# Patient Record
Sex: Female | Born: 1985 | Race: White | Hispanic: No | Marital: Married | Smoking: Never smoker
Health system: Southern US, Community
[De-identification: ages and names within clinical notes are randomized; demographics above are authoritative.]

## PROBLEM LIST (undated history)

## (undated) ENCOUNTER — Inpatient Hospital Stay: Payer: Self-pay

## (undated) DIAGNOSIS — Z8619 Personal history of other infectious and parasitic diseases: Secondary | ICD-10-CM

## (undated) DIAGNOSIS — F419 Anxiety disorder, unspecified: Secondary | ICD-10-CM

## (undated) DIAGNOSIS — Z8661 Personal history of infections of the central nervous system: Secondary | ICD-10-CM

## (undated) HISTORY — DX: Personal history of infections of the central nervous system: Z86.61

## (undated) HISTORY — DX: Anxiety disorder, unspecified: F41.9

## (undated) HISTORY — DX: Personal history of other infectious and parasitic diseases: Z86.19

## (undated) HISTORY — PX: WISDOM TOOTH EXTRACTION: SHX21

---
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
## 2008-05-12 DIAGNOSIS — Z8661 Personal history of infections of the central nervous system: Secondary | ICD-10-CM | POA: Insufficient documentation

## 2010-06-07 ENCOUNTER — Ambulatory Visit: Payer: Self-pay | Admitting: Internal Medicine

## 2010-06-16 ENCOUNTER — Ambulatory Visit: Payer: Self-pay | Admitting: Family Medicine

## 2010-09-06 ENCOUNTER — Ambulatory Visit: Payer: Self-pay | Admitting: Internal Medicine

## 2010-10-06 ENCOUNTER — Ambulatory Visit: Payer: Self-pay | Admitting: Family Medicine

## 2014-03-09 ENCOUNTER — Ambulatory Visit (INDEPENDENT_AMBULATORY_CARE_PROVIDER_SITE_OTHER): Payer: BC Managed Care – PPO

## 2014-03-09 ENCOUNTER — Ambulatory Visit (INDEPENDENT_AMBULATORY_CARE_PROVIDER_SITE_OTHER): Payer: BC Managed Care – PPO | Admitting: Podiatry

## 2014-03-09 DIAGNOSIS — M722 Plantar fascial fibromatosis: Secondary | ICD-10-CM

## 2014-03-09 DIAGNOSIS — M79672 Pain in left foot: Secondary | ICD-10-CM

## 2014-03-09 DIAGNOSIS — M79671 Pain in right foot: Secondary | ICD-10-CM

## 2014-03-09 NOTE — Patient Instructions (Signed)
Plantar Fasciitis (Heel Spur Syndrome) with Rehab The plantar fascia is a fibrous, ligament-like, soft-tissue structure that spans the bottom of the foot. Plantar fasciitis is a condition that causes pain in the foot due to inflammation of the tissue. SYMPTOMS   Pain and tenderness on the underneath side of the foot.  Pain that worsens with standing or walking. CAUSES  Plantar fasciitis is caused by irritation and injury to the plantar fascia on the underneath side of the foot. Common mechanisms of injury include:  Direct trauma to bottom of the foot.  Damage to a small nerve that runs under the foot where the main fascia attaches to the heel bone.  Stress placed on the plantar fascia due to bone spurs. RISK INCREASES WITH:   Activities that place stress on the plantar fascia (running, jumping, pivoting, or cutting).  Poor strength and flexibility.  Improperly fitted shoes.  Tight calf muscles.  Flat feet.  Failure to warm-up properly before activity.  Obesity. PREVENTION  Warm up and stretch properly before activity.  Allow for adequate recovery between workouts.  Maintain physical fitness:  Strength, flexibility, and endurance.  Cardiovascular fitness.  Maintain a health body weight.  Avoid stress on the plantar fascia.  Wear properly fitted shoes, including arch supports for individuals who have flat feet. PROGNOSIS  If treated properly, then the symptoms of plantar fasciitis usually resolve without surgery. However, occasionally surgery is necessary. RELATED COMPLICATIONS   Recurrent symptoms that may result in a chronic condition.  Problems of the lower back that are caused by compensating for the injury, such as limping.  Pain or weakness of the foot during push-off following surgery.  Chronic inflammation, scarring, and partial or complete fascia tear, occurring more often from repeated injections. TREATMENT  Treatment initially involves the use of  ice and medication to help reduce pain and inflammation. The use of strengthening and stretching exercises may help reduce pain with activity, especially stretches of the Achilles tendon. These exercises may be performed at home or with a therapist. Your caregiver may recommend that you use heel cups of arch supports to help reduce stress on the plantar fascia. Occasionally, corticosteroid injections are given to reduce inflammation. If symptoms persist for greater than 6 months despite non-surgical (conservative), then surgery may be recommended.  MEDICATION   If pain medication is necessary, then nonsteroidal anti-inflammatory medications, such as aspirin and ibuprofen, or other minor pain relievers, such as acetaminophen, are often recommended.  Do not take pain medication within 7 days before surgery.  Prescription pain relievers may be given if deemed necessary by your caregiver. Use only as directed and only as much as you need.  Corticosteroid injections may be given by your caregiver. These injections should be reserved for the most serious cases, because they may only be given a certain number of times. HEAT AND COLD  Cold treatment (icing) relieves pain and reduces inflammation. Cold treatment should be applied for 10 to 15 minutes every 2 to 3 hours for inflammation and pain and immediately after any activity that aggravates your symptoms. Use ice packs or massage the area with a piece of ice (ice massage).  Heat treatment may be used prior to performing the stretching and strengthening activities prescribed by your caregiver, physical therapist, or athletic trainer. Use a heat pack or soak the injury in warm water. SEEK IMMEDIATE MEDICAL CARE IF:  Treatment seems to offer no benefit, or the condition worsens.  Any medications produce adverse side effects. EXERCISES RANGE   OF MOTION (ROM) AND STRETCHING EXERCISES - Plantar Fasciitis (Heel Spur Syndrome) These exercises may help you  when beginning to rehabilitate your injury. Your symptoms may resolve with or without further involvement from your physician, physical therapist or athletic trainer. While completing these exercises, remember:   Restoring tissue flexibility helps normal motion to return to the joints. This allows healthier, less painful movement and activity.  An effective stretch should be held for at least 30 seconds.  A stretch should never be painful. You should only feel a gentle lengthening or release in the stretched tissue. RANGE OF MOTION - Toe Extension, Flexion  Sit with your right / left leg crossed over your opposite knee.  Grasp your toes and gently pull them back toward the top of your foot. You should feel a stretch on the bottom of your toes and/or foot.  Hold this stretch for __________ seconds.  Now, gently pull your toes toward the bottom of your foot. You should feel a stretch on the top of your toes and or foot.  Hold this stretch for __________ seconds. Repeat __________ times. Complete this stretch __________ times per day.  RANGE OF MOTION - Ankle Dorsiflexion, Active Assisted  Remove shoes and sit on a chair that is preferably not on a carpeted surface.  Place right / left foot under knee. Extend your opposite leg for support.  Keeping your heel down, slide your right / left foot back toward the chair until you feel a stretch at your ankle or calf. If you do not feel a stretch, slide your bottom forward to the edge of the chair, while still keeping your heel down.  Hold this stretch for __________ seconds. Repeat __________ times. Complete this stretch __________ times per day.  STRETCH - Gastroc, Standing  Place hands on wall.  Extend right / left leg, keeping the front knee somewhat bent.  Slightly point your toes inward on your back foot.  Keeping your right / left heel on the floor and your knee straight, shift your weight toward the wall, not allowing your back to  arch.  You should feel a gentle stretch in the right / left calf. Hold this position for __________ seconds. Repeat __________ times. Complete this stretch __________ times per day. STRETCH - Soleus, Standing  Place hands on wall.  Extend right / left leg, keeping the other knee somewhat bent.  Slightly point your toes inward on your back foot.  Keep your right / left heel on the floor, bend your back knee, and slightly shift your weight over the back leg so that you feel a gentle stretch deep in your back calf.  Hold this position for __________ seconds. Repeat __________ times. Complete this stretch __________ times per day. STRETCH - Gastrocsoleus, Standing  Note: This exercise can place a lot of stress on your foot and ankle. Please complete this exercise only if specifically instructed by your caregiver.   Place the ball of your right / left foot on a step, keeping your other foot firmly on the same step.  Hold on to the wall or a rail for balance.  Slowly lift your other foot, allowing your body weight to press your heel down over the edge of the step.  You should feel a stretch in your right / left calf.  Hold this position for __________ seconds.  Repeat this exercise with a slight bend in your right / left knee. Repeat __________ times. Complete this stretch __________ times per day.    STRENGTHENING EXERCISES - Plantar Fasciitis (Heel Spur Syndrome)  These exercises may help you when beginning to rehabilitate your injury. They may resolve your symptoms with or without further involvement from your physician, physical therapist or athletic trainer. While completing these exercises, remember:   Muscles can gain both the endurance and the strength needed for everyday activities through controlled exercises.  Complete these exercises as instructed by your physician, physical therapist or athletic trainer. Progress the resistance and repetitions only as guided. STRENGTH -  Towel Curls  Sit in a chair positioned on a non-carpeted surface.  Place your foot on a towel, keeping your heel on the floor.  Pull the towel toward your heel by only curling your toes. Keep your heel on the floor.  If instructed by your physician, physical therapist or athletic trainer, add ____________________ at the end of the towel. Repeat __________ times. Complete this exercise __________ times per day. STRENGTH - Ankle Inversion  Secure one end of a rubber exercise band/tubing to a fixed object (table, pole). Loop the other end around your foot just before your toes.  Place your fists between your knees. This will focus your strengthening at your ankle.  Slowly, pull your big toe up and in, making sure the band/tubing is positioned to resist the entire motion.  Hold this position for __________ seconds.  Have your muscles resist the band/tubing as it slowly pulls your foot back to the starting position. Repeat __________ times. Complete this exercises __________ times per day.  Document Released: 04/28/2005 Document Revised: 07/21/2011 Document Reviewed: 08/10/2008 [employer] Patient Information 2015 [employer]. This information is not intended to replace advice given to you by your health care provider. Make sure you discuss any questions you have with your health care provider.  

## 2014-03-09 NOTE — Progress Notes (Signed)
   Subjective:    Patient ID: Claudia Rogers, female    DOB: 10-Aug-1985, 28 y.o.   MRN: [id number]  HPI Patient presents the office with complaints of bilateral heel pain which some present for a couple of years and has been increasing over the past 1 year. She states that she is a runner and she's had to decrease her activity due to pain in her heels. She states that the area still stiff in the morning and gets better with activity and there is pain by the end of the day. She feels like there is a sharp pain in her heels bilaterally. She denies any specific injury or trauma to the area. She has tried over-the-counter inserts, shoe changes, stretching, ice, compression stocks, NSAIDs without much relief of symptoms. No other complaints at this time.   Review of Systems  All other systems reviewed and are negative.      Objective:   Physical Exam AAO x3, NAD DP/PT pulses palpable bilaterally, CRT less than 3 seconds Protective sensation intact with Simms Weinstein monofilament, vibratory sensation intact, Achilles tendon reflex intact Tenderness to palpation over the plantar tubercle of the calcaneus at the insertion of the plantar fascia bilaterally. There is no pain along the course of the plantar fascia out within the arch of the foot. Upon dorsiflexion of the hallux to plantar fasciitis taut bilaterally and appears intact. There is no pain with lateral compression of the calcaneus or with vibratory sensation to the calcaneus. There is no pain on the posterior aspect or along the course of the Achilles tendon. There is no pain on palpation or with vibration to other areas of the foot or ankle. MMT 5/5, ROM WNL No calf pain, swelling, warmth, erythema No open lesions or pre-ulcerative lesions.      Assessment & Plan:  28 year old female with bilateral heel pain, likely plantar fasciitis. -X-rays were obtained and reviewed with the patient. -Conservative versus surgical treatment discussed  including alternatives, risks, complications. -Patient elects to proceed with steroid injection into the bilateral heels. Under sterile skin preparation, a total of 2.5cc of kenalog 10, 0.5% Marcaine plain, and 2% lidocaine plain were infiltrated into the symptomatic area without complication. A band-aid was applied. Patient tolerated the injections well without complication.  -Plantar fascial braces dispensed bilaterally. -Ice to the affected area -Continue stretching exercises. -Discussed the importance of supportive shoe gear and orthotics. At today's appointment patient elects to proceed with custom orthotics. [facility] was [facility]. -If she continues to have symptoms will obtain MRI due to prolonged pain despite multiple treatments. -Follow-up in 3 weeks or sooner if any problems are to arise or any change in symptoms. In the meantime call the office with any questions, concerns, change in symptoms.

## 2014-03-10 ENCOUNTER — Other Ambulatory Visit: Payer: Self-pay

## 2014-03-10 ENCOUNTER — Telehealth: Payer: Self-pay | Admitting: *Deleted

## 2014-03-10 ENCOUNTER — Other Ambulatory Visit: Payer: Self-pay | Admitting: Podiatry

## 2014-03-10 MED ORDER — MELOXICAM 7.5 MG PO TABS: 7.5000 mg | ORAL_TABLET | Freq: Every day | ORAL | Status: DC

## 2014-03-10 NOTE — Telephone Encounter (Signed)
Patient called returning phone call this morning about her pharmacy , [facility] [place] , patient will call pharmacy before going to get prescription

## 2014-03-28 ENCOUNTER — Ambulatory Visit (INDEPENDENT_AMBULATORY_CARE_PROVIDER_SITE_OTHER): Payer: BC Managed Care – PPO | Admitting: Podiatry

## 2014-03-28 VITALS — BP 109/66 | HR 79 | Resp 16

## 2014-03-28 DIAGNOSIS — M722 Plantar fascial fibromatosis: Secondary | ICD-10-CM

## 2014-03-28 DIAGNOSIS — M79671 Pain in right foot: Secondary | ICD-10-CM

## 2014-03-28 DIAGNOSIS — M79672 Pain in left foot: Secondary | ICD-10-CM

## 2014-03-28 NOTE — Patient Instructions (Signed)

## 2014-03-29 NOTE — Progress Notes (Signed)
Patient ID: Claudia Rogers, female   DOB: 1985-07-20, 28 y.o.   MRN: [id number]  Subjective: 28 year old female presents the office today for orthotic pickup for bilateral plantar fasciitis. She states that over the last couple days she has noticed some improvement in symptoms. She states that after the injection and facet pointed she did have some discomfort for a couple of days after the injection before subsided. She's been continuing to wear lace up sneakers. She believes that the area has not been as painful over the last couple of days as she rested over the weekend she has decreased her running and activity. She denies any recent injury or trauma to the area.The patient was inquiring if the steroid injection had any side effects with possible vaginal bleeding. She states that since last injection she has had some discomfort in her pelvis and was wondering if this was a side effect of the injection. No other complaints at this time. No other acute changes since last appointment.  Objective: AAO x3, NAD DP/PT pulses palpable bilaterally, CRT less than 3 seconds Protective sensation intact with Dorann OuSimms Weinstein monofilament The patient was fitted for orthotics and a pair to be fitting well without any high-pressure areas. There continues to mild discomfort on the plantar medial tubercle of the calcaneus at the insertion of plantar fascial bilaterally. There is no increased edema, erythema, increase in warmth. No calf pain, swelling, warmth, erythema.  Assessment: -Treatment options discussed including alternatives, risks, competitions. -The patient was fitted for the orthotics and they appear to be fitting well without any competitions. The patient tried to monitor shoes. I discussed with her that if she has any high-pressure areas or the do not appear to be fitting well to bring the back to the office and we will send them back to the lab for modifications. -Continue with stretching  exercises. -Continue with ice to the area. -Discussed night splint. Patient states that she'll purchase it elsewhere.  -Because of the bleeding would discontinue Mobic. -discussed that if symptoms persist we'll likely MRI. -Discussed the patient with the injections most likely not the result of the bleeding/pelvic pain. Directly the patient follow up with her gynecologist. -Follow-up in 3 weeks. In the meantime, call the office with any questions, concerns, change in symptoms.

## 2014-04-27 ENCOUNTER — Ambulatory Visit: Payer: BC Managed Care – PPO | Admitting: Podiatry

## 2014-05-03 ENCOUNTER — Ambulatory Visit (INDEPENDENT_AMBULATORY_CARE_PROVIDER_SITE_OTHER): Payer: BC Managed Care – PPO | Admitting: Family Medicine

## 2014-05-03 ENCOUNTER — Encounter (INDEPENDENT_AMBULATORY_CARE_PROVIDER_SITE_OTHER): Payer: Self-pay

## 2014-05-03 ENCOUNTER — Encounter: Payer: Self-pay | Admitting: Family Medicine

## 2014-05-03 VITALS — BP 112/74 | HR 80 | Temp 98.3°F | Ht 63.75 in | Wt 134.0 lb

## 2014-05-03 DIAGNOSIS — Z0000 Encounter for general adult medical examination without abnormal findings: Secondary | ICD-10-CM | POA: Insufficient documentation

## 2014-05-03 NOTE — Patient Instructions (Signed)
Tdap vaccine today  Continue healthy lifestyle  Consider a flu shot in the future

## 2014-05-03 NOTE — Progress Notes (Signed)
Subjective:    Patient ID: Claudia Rogers, female    DOB: Dec 05, 1985, 28 y.o.   MRN: [id number]  HPI 28 yo female here to ext for primary care   Healthy  bmi is 2523  Is a healthy eater most of the time  Sees Dr Ellyn HackBovard for gyn care  Pap was 9/15  Std screen- does not need   Td 93  Wants to get her Tdap today    Flu shot - usually does not get them  They "freak her out"  May think about it in the future    Hx of eye muscle surgery Hx of viral meningitis in 09   Has wisdom teeth removal scheduled in Jan   Takes PNV  Is a Runner, broadcasting/film/videoteacher - special ed teacher at [employer]  Married - and trying to get pregnant right now  Today is the 28 day of cycle - hopes she is pregnant    Exercises regularly - is a runner  Dealing with plantar fasciitis on both feet - is taking a break  Is getting injections at [facility]  Is taking classes at [employer]   Had last labs in 10/14  Nl chemistries Nl tsh  Nl cbc  Cholesterol    Total 150 Trig 62 HDL 52 LDL 86   Excellent profile    There are no active problems to display for this patient.  Past Medical History  Diagnosis Date  . History of chicken pox   . H/O viral meningitis    Past Surgical History  Procedure Laterality Date  . Eye muscle surgery Bilateral 1996  . Eye muscle surgery Bilateral 2000  . Odontoma removal/implant  2003   History  Substance Use Topics  . Smoking status: Never Smoker   . Smokeless tobacco: Never Used  . Alcohol Use: 0.0 oz/week    0 Not specified per week     Comment: Social   Family History  Problem Relation Age of Onset  . Arthritis Mother   . Arthritis Maternal Grandmother   . Alcoholism Maternal Aunt   . Alcoholism Maternal Uncle   . Alcoholism Cousin   . Lung cancer Father   . Lung cancer Paternal Aunt   . Stroke Maternal Grandmother   . High blood pressure Mother   . High blood pressure Maternal Grandmother   . High blood pressure Sister   . Kidney  disease Maternal Aunt   . Mental illness Sister   . Diabetes Mother     Type II  . Diabetes Maternal Grandmother     Type II  . Diabetes Brother     Type II-BKA   No Known Allergies No current outpatient prescriptions on file prior to visit.   No current facility-administered medications on file prior to visit.      Review of Systems Review of Systems  Constitutional: Negative for fever, appetite change, fatigue and unexpected weight change.  Eyes: Negative for pain and visual disturbance.  Respiratory: Negative for cough and shortness of breath.   Cardiovascular: Negative for cp or palpitations    Gastrointestinal: Negative for nausea, diarrhea and constipation.  Genitourinary: Negative for urgency and frequency.  Skin: Negative for pallor or rash   Neurological: Negative for weakness, light-headedness, numbness and headaches.  Hematological: Negative for adenopathy. Does not bruise/bleed easily.  Psychiatric/Behavioral: Negative for dysphoric mood. The patient is not nervous/anxious.         Objective:   Physical Exam  Constitutional:  She appears well-developed and well-nourished. No distress.  HENT:  Head: Normocephalic and atraumatic.  Right Ear: External ear normal.  Left Ear: External ear normal.  Nose: Nose normal.  Mouth/Throat: Oropharynx is clear and moist.  Eyes: Conjunctivae and EOM are normal. Pupils are equal, round, and reactive to light. Right eye exhibits no discharge. Left eye exhibits no discharge. No scleral icterus.  Neck: Normal range of motion. Neck supple. No JVD present. No thyromegaly present.  Cardiovascular: Normal rate, regular rhythm, normal heart sounds and intact distal pulses.  Exam reveals no gallop.   Pulmonary/Chest: Effort normal and breath sounds normal. No respiratory distress. She has no wheezes. She has no rales.  Abdominal: Soft. Bowel sounds are normal. She exhibits no distension and no mass. There is no tenderness.    Musculoskeletal: She exhibits no edema or tenderness.  Lymphadenopathy:    She has no cervical adenopathy.  Neurological: She is alert. She has normal reflexes. No cranial nerve deficit. She exhibits normal muscle tone. Coordination normal.  Skin: Skin is warm and dry. No rash noted. No erythema. No pallor.  Psychiatric: She has a normal mood and affect.          Assessment & Plan:   Problem List Items Addressed This Visit      Other   Routine general medical examination at a health care facility - Primary    Reviewed health habits including diet and exercise and skin cancer prevention Reviewed appropriate screening tests for age  Also reviewed health mt list, fam hx and immunization status , as well as social and family history    Tdap today  Pt declines flu vaccine - disc pros and cons and urged her to consider it  Pre conception counseling today - avoid alcohol and otc med and keep up healthy lifestyle

## 2014-05-03 NOTE — Progress Notes (Signed)
Pre visit review using our clinic review tool, if applicable. No additional management support is needed unless otherwise documented below in the visit note. 

## 2014-05-03 NOTE — Assessment & Plan Note (Signed)
Reviewed health habits including diet and exercise and skin cancer prevention Reviewed appropriate screening tests for age  Also reviewed health mt list, fam hx and immunization status , as well as social and family history    Tdap today  Pt declines flu vaccine - disc pros and cons and urged her to consider it  Pre conception counseling today - avoid alcohol and otc med and keep up healthy lifestyle

## 2014-08-03 ENCOUNTER — Telehealth: Payer: Self-pay | Admitting: Family Medicine

## 2014-08-03 ENCOUNTER — Telehealth: Payer: Self-pay | Admitting: Primary Care

## 2014-08-03 ENCOUNTER — Encounter: Payer: Self-pay | Admitting: Primary Care

## 2014-08-03 ENCOUNTER — Ambulatory Visit (INDEPENDENT_AMBULATORY_CARE_PROVIDER_SITE_OTHER): Payer: BC Managed Care – PPO | Admitting: Primary Care

## 2014-08-03 VITALS — BP 110/68 | HR 83 | Temp 98.0°F | Ht 63.75 in | Wt 131.4 lb

## 2014-08-03 DIAGNOSIS — R05 Cough: Secondary | ICD-10-CM

## 2014-08-03 DIAGNOSIS — R059 Cough, unspecified: Secondary | ICD-10-CM

## 2014-08-03 MED ORDER — PREDNISONE 20 MG PO TABS: ORAL_TABLET | ORAL | Status: DC

## 2014-08-03 MED ORDER — HYDROCODONE-HOMATROPINE 5-1.5 MG/5ML PO SYRP: 5.0000 mL | ORAL_SOLUTION | Freq: Three times a day (TID) | ORAL | Status: DC | PRN

## 2014-08-03 MED ORDER — BENZONATATE 100 MG PO CAPS: 100.0000 mg | ORAL_CAPSULE | Freq: Three times a day (TID) | ORAL | Status: DC | PRN

## 2014-08-03 NOTE — Telephone Encounter (Signed)
Patient got cough pearls today and she is still coughing like crazy.  They are not helping at all and she has had 2 doses today.  She wants to know if Claud[facility] [place]

## 2014-08-03 NOTE — Addendum Note (Signed)
Addended by: Doreene NestLARK, Nysa Sarin K on: 08/03/2014 06:18 PM   Modules accepted: Orders

## 2014-08-03 NOTE — Assessment & Plan Note (Signed)
Suspect this is a viral URI. RX for Tessalon Pearls provided for cough. Continue Mucinex DM, nasonex, and allegra. Push fluids (water). Follow up if no improvement in one week.

## 2014-08-03 NOTE — Progress Notes (Signed)
Pre visit review using our clinic review tool, if applicable. No additional management support is needed unless otherwise documented below in the visit note. 

## 2014-08-03 NOTE — Telephone Encounter (Signed)
Spoke with patient. RX will be printed for Hycodan.

## 2014-08-03 NOTE — Patient Instructions (Signed)
Your symptoms today are likely related to a virus. Continue your Nasonex, Mucinex DM, and allegra. Start Tessalon Pearls three times daily as needed for cough. Please call me if no improvement in the next 5 days. I hope you feel better!  Upper Respiratory Infection, Adult An upper respiratory infection (URI) is also sometimes known as the common cold. The upper respiratory tract includes the nose, sinuses, throat, trachea, and bronchi. Bronchi are the airways leading to the lungs. Most people improve within 1 week, but symptoms can last up to 2 weeks. A residual cough may last even longer.  CAUSES Many different viruses can infect the tissues lining the upper respiratory tract. The tissues become irritated and inflamed and often become very moist. Mucus production is also common. A cold is contagious. You can easily spread the virus to others by oral contact. This includes kissing, sharing a glass, coughing, or sneezing. Touching your mouth or nose and then touching a surface, which is then touched by another person, can also spread the virus. SYMPTOMS  Symptoms typically develop 1 to 3 days after you come in contact with a cold virus. Symptoms vary from person to person. They may include:  Runny nose.  Sneezing.  Nasal congestion.  Sinus irritation.  Sore throat.  Loss of voice (laryngitis).  Cough.  Fatigue.  Muscle aches.  Loss of appetite.  Headache.  Low-grade fever. DIAGNOSIS  You might diagnose your own cold based on familiar symptoms, since most people get a cold 2 to 3 times a year. Your caregiver can confirm this based on your exam. Most importantly, your caregiver can check that your symptoms are not due to another disease such as strep throat, sinusitis, pneumonia, asthma, or epiglottitis. Blood tests, throat tests, and X-rays are not necessary to diagnose a common cold, but they may sometimes be helpful in excluding other more serious diseases. Your caregiver will  decide if any further tests are required. RISKS AND COMPLICATIONS  You may be at risk for a more severe case of the common cold if you smoke cigarettes, have chronic heart disease (such as heart failure) or lung disease (such as asthma), or if you have a weakened immune system. The very young and very old are also at risk for more serious infections. Bacterial sinusitis, middle ear infections, and bacterial pneumonia can complicate the common cold. The common cold can worsen asthma and chronic obstructive pulmonary disease (COPD). Sometimes, these complications can require emergency medical care and may be life-threatening. PREVENTION  The best way to protect against getting a cold is to practice good hygiene. Avoid oral or hand contact with people with cold symptoms. Wash your hands often if contact occurs. There is no clear evidence that vitamin C, vitamin E, echinacea, or exercise reduces the chance of developing a cold. However, it is always recommended to get plenty of rest and practice good nutrition. TREATMENT  Treatment is directed at relieving symptoms. There is no cure. Antibiotics are not effective, because the infection is caused by a virus, not by bacteria. Treatment may include:  Increased fluid intake. Sports drinks offer valuable electrolytes, sugars, and fluids.  Breathing heated mist or steam (vaporizer or shower).  Eating chicken soup or other clear broths, and maintaining good nutrition.  Getting plenty of rest.  Using gargles or lozenges for comfort.  Controlling fevers with ibuprofen or acetaminophen as directed by your caregiver.  Increasing usage of your inhaler if you have asthma. Zinc gel and zinc lozenges, taken in the  first 24 hours of the common cold, can shorten the duration and lessen the severity of symptoms. Pain medicines may help with fever, muscle aches, and throat pain. A variety of non-prescription medicines are available to treat congestion and runny nose.  Your caregiver can make recommendations and may suggest nasal or lung inhalers for other symptoms.  HOME CARE INSTRUCTIONS   Only take over-the-counter or prescription medicines for pain, discomfort, or fever as directed by your caregiver.  Use a warm mist humidifier or inhale steam from a shower to increase air moisture. This may keep secretions moist and make it easier to breathe.  Drink enough water and fluids to keep your urine clear or pale yellow.  Rest as needed.  Return to work when your temperature has returned to normal or as your caregiver advises. You may need to stay home longer to avoid infecting others. You can also use a face mask and careful hand washing to prevent spread of the virus. SEEK MEDICAL CARE IF:   After the first few days, you feel you are getting worse rather than better.  You need your caregiver's advice about medicines to control symptoms.  You develop chills, worsening shortness of breath, or brown or red sputum. These may be signs of pneumonia.  You develop yellow or brown nasal discharge or pain in the face, especially when you bend forward. These may be signs of sinusitis.  You develop a fever, swollen neck glands, pain with swallowing, or Hollerbach areas in the back of your throat. These may be signs of strep throat. SEEK IMMEDIATE MEDICAL CARE IF:   You have a fever.  You develop severe or persistent headache, ear pain, sinus pain, or chest pain.  You develop wheezing, a prolonged cough, cough up blood, or have a change in your usual mucus (if you have chronic lung disease).  You develop sore muscles or a stiff neck. Document Released: 10/22/2000 Document Revised: 07/21/2011 Document Reviewed: 08/03/2013 [facility] Patient Information 2015 [place], [place]. This information is not intended to replace advice given to you by your health care provider. Make sure you discuss any questions you have with your health care provider.

## 2014-08-03 NOTE — Progress Notes (Signed)
Subjective:    Patient ID: Claudia Rogers, female    DOB: 03/19/1986, 29 y.o.   MRN: [id number]  HPI  Claudia Rogers is a 29 year old female who presents today with a chief complant of cough that has been present for one week with worsening cough over past two days. She denies sore throat, fevers, chills, rhinorrhea, and nausea. She's been taking regular strength Mucinex DM since Saturday last weekend but changed to the extra strength DM on Tuesday after chest congestion worsened. She's been able to cough up her congestion which is mostly clear with a little yellow. Her most bothersome complaint is her cough. She's been using Nasonex which has helped with nasal congestion and Allegra for allergies.   Review of Systems  Constitutional: Negative for fever, chills and fatigue.  HENT: Positive for postnasal drip and sinus pressure. Negative for ear pain, rhinorrhea and sore throat.   Respiratory: Positive for cough and chest tightness. Negative for shortness of breath.   Cardiovascular: Negative for chest pain.  Gastrointestinal: Negative for nausea, vomiting and abdominal pain.  Musculoskeletal: Negative for myalgias.  Neurological: Positive for headaches.  Hematological: Negative for adenopathy.       Past Medical History  Diagnosis Date  . History of chicken pox   . H/O viral meningitis     History   Social History  . Marital Status: Married    Spouse Name: N/A  . Number of Children: N/A  . Years of Education: N/A   Occupational History  . Not on file.   Social History Main Topics  . Smoking status: Never Smoker   . Smokeless tobacco: Never Used  . Alcohol Use: 0.0 oz/week    0 Standard drinks or equivalent per week     Comment: Social  . Drug Use: No  . Sexual Activity: Not on file   Other Topics Concern  . Not on file   Social History Narrative    Past Surgical History  Procedure Laterality Date  . Eye muscle surgery Bilateral 1996  . Eye muscle surgery Bilateral  2000  . Odontoma removal/implant  2003    Family History  Problem Relation Age of Onset  . Arthritis Mother   . Arthritis Maternal Grandmother   . Alcoholism Maternal Aunt   . Alcoholism Maternal Uncle   . Alcoholism Cousin   . Lung cancer Father   . Lung cancer Paternal Aunt   . Stroke Maternal Grandmother   . High blood pressure Mother   . High blood pressure Maternal Grandmother   . High blood pressure Sister   . Kidney disease Maternal Aunt   . Mental illness Sister   . Diabetes Mother     Type II  . Diabetes Maternal Grandmother     Type II  . Diabetes Brother     Type II-BKA    No Known Allergies  Current Outpatient Prescriptions on File Prior to Visit  Medication Sig Dispense Refill  . Docosahexaenoic Acid (PRENATAL DHA PO) Take 1 tablet by mouth daily.    . Prenatal Vit-Fe Fumarate-FA (MULTIVITAMIN-PRENATAL) 27-0.8 MG TABS tablet Take 1 tablet by mouth daily at 12 noon.     No current facility-administered medications on file prior to visit.    BP 110/68 mmHg  Pulse 83  Temp(Src) 98 F (36.7 C) (Oral)  Ht 5' 3.75" (1.619 m)  Wt 131 lb 6.4 oz (59.603 kg)  BMI 22.74 kg/m2  SpO2 98%    Objective:   Physical  Exam  Constitutional: She is oriented to person, place, and time. She appears well-developed.  HENT:  Head: Normocephalic.  Right Ear: External ear normal. Tympanic membrane is not injected and not erythematous.  Left Ear: External ear normal. Tympanic membrane is not injected and not erythematous.  Nose: Nose normal.  Mouth/Throat: Oropharynx is clear and moist.  Eyes: Conjunctivae are normal. Pupils are equal, round, and reactive to light.  Neck: Neck supple.  Cardiovascular: Normal rate and regular rhythm.   Pulmonary/Chest: Effort normal. She has no wheezes. She has no rales.  Lymphadenopathy:    She has no cervical adenopathy.  Neurological: She is alert and oriented to person, place, and time.  Skin: Skin is warm and dry.  Psychiatric:  She has a normal mood and affect.          Assessment & Plan:

## 2014-08-06 ENCOUNTER — Encounter: Payer: Self-pay | Admitting: Primary Care

## 2014-08-09 ENCOUNTER — Ambulatory Visit (INDEPENDENT_AMBULATORY_CARE_PROVIDER_SITE_OTHER): Payer: BC Managed Care – PPO | Admitting: Primary Care

## 2014-08-09 ENCOUNTER — Encounter: Payer: Self-pay | Admitting: Primary Care

## 2014-08-09 ENCOUNTER — Ambulatory Visit (INDEPENDENT_AMBULATORY_CARE_PROVIDER_SITE_OTHER)
Admission: RE | Admit: 2014-08-09 | Discharge: 2014-08-09 | Disposition: A | Discharge status: Home / Self Care | Payer: BC Managed Care – PPO | Source: Ambulatory Visit | Attending: Primary Care | Admitting: Primary Care

## 2014-08-09 VITALS — BP 112/68 | HR 88 | Temp 98.0°F | Ht 63.75 in | Wt 136.8 lb

## 2014-08-09 DIAGNOSIS — R05 Cough: Secondary | ICD-10-CM | POA: Diagnosis not present

## 2014-08-09 DIAGNOSIS — R059 Cough, unspecified: Secondary | ICD-10-CM

## 2014-08-09 LAB — CBC WITH DIFFERENTIAL/PLATELET
Basophils Absolute: 0.1 10*3/uL (ref 0.0–0.1)
Basophils Relative: 0.5 % (ref 0.0–3.0)
EOS PCT: 3 % (ref 0.0–5.0)
Eosinophils Absolute: 0.3 10*3/uL (ref 0.0–0.7)
HCT: 37.2 % (ref 36.0–46.0)
HEMOGLOBIN: 12.6 g/dL (ref 12.0–15.0)
LYMPHS PCT: 36.3 % (ref 12.0–46.0)
Lymphs Abs: 3.8 10*3/uL (ref 0.7–4.0)
MCHC: 34 g/dL (ref 30.0–36.0)
MCV: 94 fl (ref 78.0–100.0)
Monocytes Absolute: 1.1 10*3/uL — ABNORMAL HIGH (ref 0.1–1.0)
Monocytes Relative: 10 % (ref 3.0–12.0)
Neutro Abs: 5.3 10*3/uL (ref 1.4–7.7)
Neutrophils Relative %: 50.2 % (ref 43.0–77.0)
PLATELETS: 330 10*3/uL (ref 150.0–400.0)
RBC: 3.96 Mil/uL (ref 3.87–5.11)
RDW: 12.2 % (ref 11.5–15.5)
WBC: 10.5 10*3/uL (ref 4.0–10.5)

## 2014-08-09 MED ORDER — ALBUTEROL SULFATE HFA 108 (90 BASE) MCG/ACT IN AERS: 2.0000 | INHALATION_SPRAY | Freq: Four times a day (QID) | RESPIRATORY_TRACT | Status: DC | PRN

## 2014-08-09 MED ORDER — AZITHROMYCIN 250 MG PO TABS: ORAL_TABLET | ORAL | Status: DC

## 2014-08-09 NOTE — Patient Instructions (Signed)
Complete lab work and xray prior to leaving. I will call you will your results as discussed. I've also called in an inhaler to your pharmacy to help open up your airways for your shortness of breath. I sincerely hope you feel better soon!

## 2014-08-09 NOTE — Progress Notes (Signed)
Pre visit review using our clinic review tool, if applicable. No additional management support is needed unless otherwise documented below in the visit note. 

## 2014-08-09 NOTE — Progress Notes (Signed)
Subjective:    Patient ID: Claudia Rogers, female    DOB: 1985-06-26, 29 y.o.   MRN: [id number]  HPI  Claudia Rogers is a 29 year old female who presents today for follow up of cough. She was seen by myself on 08/03/14 and it was suspected that she had a URI due to lack of systemic symptoms and her presentation. She was provided with a prescription for Tessalon Pearls which did not help with her cough, then Hycodan and prednisone which provided some relief. She started to feel better Sunday, but has not improved since then. Her cough is now productive with thick yellowish mucous and has now been present for a total of 2 weeks. She is a non-smoker.   Review of Systems  Constitutional: Positive for fatigue. Negative for fever and chills.  HENT: Positive for congestion, postnasal drip and sinus pressure. Negative for ear pain and sore throat.   Respiratory: Positive for cough, chest tightness and shortness of breath.   Cardiovascular: Negative for chest pain.  Gastrointestinal: Negative for nausea and vomiting.  Musculoskeletal: Negative for myalgias.  Neurological: Positive for headaches.       Past Medical History  Diagnosis Date  . History of chicken pox   . H/O viral meningitis     History   Social History  . Marital Status: Married    Spouse Name: N/A  . Number of Children: N/A  . Years of Education: N/A   Occupational History  . Not on file.   Social History Main Topics  . Smoking status: Never Smoker   . Smokeless tobacco: Never Used  . Alcohol Use: 0.0 oz/week    0 Standard drinks or equivalent per week     Comment: Social  . Drug Use: No  . Sexual Activity: Not on file   Other Topics Concern  . Not on file   Social History Narrative    Past Surgical History  Procedure Laterality Date  . Eye muscle surgery Bilateral 1996  . Eye muscle surgery Bilateral 2000  . Odontoma removal/implant  2003    Family History  Problem Relation Age of Onset  . Arthritis  Mother   . Arthritis Maternal Grandmother   . Alcoholism Maternal Aunt   . Alcoholism Maternal Uncle   . Alcoholism Cousin   . Lung cancer Father   . Lung cancer Paternal Aunt   . Stroke Maternal Grandmother   . High blood pressure Mother   . High blood pressure Maternal Grandmother   . High blood pressure Sister   . Kidney disease Maternal Aunt   . Mental illness Sister   . Diabetes Mother     Type II  . Diabetes Maternal Grandmother     Type II  . Diabetes Brother     Type II-BKA    No Known Allergies  Current Outpatient Prescriptions on File Prior to Visit  Medication Sig Dispense Refill  . Docosahexaenoic Acid (PRENATAL DHA PO) Take 1 tablet by mouth daily.    Marland Kitchen HYDROcodone-homatropine (HYCODAN) 5-1.5 MG/5ML syrup Take 5 mLs by mouth every 8 (eight) hours as needed for cough. 120 mL 0  . predniSONE (DELTASONE) 20 MG tablet Take 3 tablets by mouth for two days, then 2 tablets by mouth for two days, then 1 tablet by mouth for two days. 12 tablet 0  . Prenatal Vit-Fe Fumarate-FA (MULTIVITAMIN-PRENATAL) 27-0.8 MG TABS tablet Take 1 tablet by mouth daily at 12 noon.    . benzonatate (TESSALON PERLES)  100 MG capsule Take 1 capsule (100 mg total) by mouth 3 (three) times daily as needed for cough. (Patient not taking: Reported on 08/09/2014) 21 capsule 0   No current facility-administered medications on file prior to visit.    BP 112/68 mmHg  Pulse 88  Temp(Src) 98 F (36.7 C) (Oral)  Ht 5' 3.75" (1.619 m)  Wt 136 lb 12.8 oz (62.052 kg)  BMI 23.67 kg/m2  SpO2 99%    Objective:   Physical Exam  Constitutional: She is oriented to person, place, and time. She appears well-developed.  Appears sickly.  HENT:  Right Ear: Tympanic membrane and external ear normal.  Left Ear: Tympanic membrane and external ear normal.  Mouth/Throat: No oropharyngeal exudate or posterior oropharyngeal edema.  Cobblestone appearance present to posterior pharynx.  Neck: Neck supple.    Cardiovascular: Normal rate and regular rhythm.   Pulmonary/Chest: Effort normal. She has decreased breath sounds in the right lower field. She has rhonchi.  Lymphadenopathy:    She has cervical adenopathy.  Neurological: She is alert and oriented to person, place, and time.  Skin: Skin is warm and dry.  Psychiatric: She has a normal mood and affect.          Assessment & Plan:

## 2014-08-09 NOTE — Assessment & Plan Note (Addendum)
No improvement with symptomatic treatement. Will obtain CBC and chest xray is negative for pneumonia. Will treat with Zpak and albuterol inhaler due to course of symptoms and no improvement. She has plenty of her Hycodan remaining.

## 2014-08-17 ENCOUNTER — Telehealth: Payer: Self-pay | Admitting: Family Medicine

## 2014-08-17 DIAGNOSIS — J209 Acute bronchitis, unspecified: Secondary | ICD-10-CM

## 2014-08-17 MED ORDER — AMOXICILLIN-POT CLAVULANATE 875-125 MG PO TABS: 1.0000 | ORAL_TABLET | Freq: Two times a day (BID) | ORAL | Status: DC

## 2014-08-17 NOTE — Telephone Encounter (Signed)
Pt called to let you know she has finished all of her meds.  She wanted to let you know that she is still coughing yellow stuff. No fever.  She wanted to know if you could call her in something else or does she need to come back to see you  [facility]

## 2014-08-17 NOTE — Telephone Encounter (Signed)
I spoke with Claudia Rogers and she's continued to feel weak, and her cough is still productive with chunks of yellow/green mucous. She's also still congested despite taking daily Zyrtec/Claritin. I notified her that I've called in Augmentin to her pharmacy in order to hopefully irradiate her infection. She is to notify me if she's not feeling better 3-4 days after taking her antibiotic. She can also use Nasonex for She verbalized understanding.

## 2014-10-10 ENCOUNTER — Encounter: Payer: Self-pay | Admitting: Family Medicine

## 2014-10-10 ENCOUNTER — Ambulatory Visit (INDEPENDENT_AMBULATORY_CARE_PROVIDER_SITE_OTHER): Payer: BC Managed Care – PPO | Admitting: Family Medicine

## 2014-10-10 ENCOUNTER — Ambulatory Visit: Payer: BC Managed Care – PPO | Admitting: Family Medicine

## 2014-10-10 VITALS — BP 110/70 | HR 69 | Temp 98.7°F | Ht 63.75 in | Wt 137.5 lb

## 2014-10-10 DIAGNOSIS — R102 Pelvic and perineal pain: Secondary | ICD-10-CM | POA: Diagnosis not present

## 2014-10-10 DIAGNOSIS — B9789 Other viral agents as the cause of diseases classified elsewhere: Principal | ICD-10-CM

## 2014-10-10 DIAGNOSIS — J069 Acute upper respiratory infection, unspecified: Secondary | ICD-10-CM

## 2014-10-10 MED ORDER — AMOXICILLIN-POT CLAVULANATE 875-125 MG PO TABS: 1.0000 | ORAL_TABLET | Freq: Two times a day (BID) | ORAL | Status: DC

## 2014-10-10 NOTE — Assessment & Plan Note (Signed)
Prod cough -with reassuring exam  Disc symptomatic care - see instructions on AVS  Update if not starting to improve in a week or if worsening   Given px for augmentin to fill if not improved in 4-5 days   (pt has hx of difficult to tx bronchitis in the past) Continue mucinex/fluids Update if not starting to improve in a week or if worsening

## 2014-10-10 NOTE — Progress Notes (Signed)
Subjective:    Patient ID: Claudia Rogers, female    DOB: 01-09-1986, 29 y.o.   MRN: [id number]  HPI Has been sick on and off since March  Was tx for bronchitis - zpak and then augmentin   Was better for 1 mo   Then last week started prod cough again- yellow mucous  Is a teacher - lots of sick exposures  Some chest tightness this am -not too bad  Tried mucinex  No fever (one week where it was 99.6 temp)  Lots of nasal congestion  No facial pain     Also having ovarian pain / hx of cysts  Has not talked to gyn about the pain  Worse on L - but both sides hurt- like a constant lower back ache  dysparunia  Had a miscarriage in March  LMP was May 22 - normal (knows she is not pregnant)    Only one cyst in the past- in Nov   Patient Active Problem List   Diagnosis Date Noted  . Cough 08/03/2014  . Routine general medical examination at a health care facility 05/03/2014   Past Medical History  Diagnosis Date  . History of chicken pox   . H/O viral meningitis    Past Surgical History  Procedure Laterality Date  . Eye muscle surgery Bilateral 1996  . Eye muscle surgery Bilateral 2000  . Odontoma removal/implant  2003   History  Substance Use Topics  . Smoking status: Never Smoker   . Smokeless tobacco: Never Used  . Alcohol Use: 0.0 oz/week    0 Standard drinks or equivalent per week     Comment: Social   Family History  Problem Relation Age of Onset  . Arthritis Mother   . Arthritis Maternal Grandmother   . Alcoholism Maternal Aunt   . Alcoholism Maternal Uncle   . Alcoholism Cousin   . Lung cancer Father   . Lung cancer Paternal Aunt   . Stroke Maternal Grandmother   . High blood pressure Mother   . High blood pressure Maternal Grandmother   . High blood pressure Sister   . Kidney disease Maternal Aunt   . Mental illness Sister   . Diabetes Mother     Type II  . Diabetes Maternal Grandmother     Type II  . Diabetes Brother     Type II-BKA   No  Known Allergies Current Outpatient Prescriptions on File Prior to Visit  Medication Sig Dispense Refill  . Prenatal Vit-Fe Fumarate-FA (MULTIVITAMIN-PRENATAL) 27-0.8 MG TABS tablet Take 1 tablet by mouth daily at 12 noon.     No current facility-administered medications on file prior to visit.     Review of Systems Review of Systems  Constitutional: Negative for fever, appetite change, and unexpected weight change.  ENT pos for cong and rhinorrhea and neg for ST or sinus pain  Eyes: Negative for pain and visual disturbance.  Respiratory: Negative for wheeze  and shortness of breath.   Cardiovascular: Negative for cp or palpitations    Gastrointestinal: Negative for nausea, diarrhea and constipation.  Genitourinary: Negative for urgency and frequency. neg for vaginal d/c , pos for pelvic pain  Skin: Negative for pallor or rash   Neurological: Negative for weakness, light-headedness, numbness and headaches.  Hematological: Negative for adenopathy. Does not bruise/bleed easily.  Psychiatric/Behavioral: Negative for dysphoric mood. The patient is not nervous/anxious.         Objective:   Physical Exam  Constitutional: She  appears well-developed and well-nourished. No distress.  HENT:  Head: Normocephalic and atraumatic.  Right Ear: External ear normal.  Left Ear: External ear normal.  Mouth/Throat: Oropharynx is clear and moist.  Nares are injected and congested  No sinus tenderness Clear rhinorrhea and post nasal drip   Eyes: Conjunctivae and EOM are normal. Pupils are equal, round, and reactive to light. Right eye exhibits no discharge. Left eye exhibits no discharge.  Neck: Normal range of motion. Neck supple.  Cardiovascular: Normal rate and normal heart sounds.   Pulmonary/Chest: Effort normal and breath sounds normal. No respiratory distress. She has no wheezes. She has no rales. She exhibits no tenderness.  Abdominal: She exhibits no distension and no mass. There is  tenderness. There is no rebound and no guarding.  Mild low abd /pelvic tenderness bilaterally -slt worse on L  No M or rebound or guarding   Musculoskeletal: She exhibits no edema.  Lymphadenopathy:    She has no cervical adenopathy.  Neurological: She is alert.  Skin: Skin is warm and dry. No rash noted.  Psychiatric: She has a normal mood and affect.          Assessment & Plan:   Problem List Items Addressed This Visit    Pelvic pain in female    Hx of ovarian cysts Pt will f/u with her gyn for eval and likely US       Viral URI with cough - Primary    Prod cough -with reassuring exam  Disc symptomatic care - see instructions on AVS  Update if not starting to improve in a week or if worsening   Given px for augmentin to fill if not improved in 4-5 days   (pt has hx of difficult to tx bronchitis in the past) Continue mucinex/fluids Update if not starting to improve in a week or if worsening

## 2014-10-10 NOTE — Patient Instructions (Addendum)
I think you have a head and chest cold  Exam is reassuring  mucinex for cough is ok  Drink lots of fluids  If not improved in 4-5 days= fill the px for augmentin and take it  Update if not starting to improve in a week or two or if worsening   See your gyn doctor about pelvic pain as soon as you can

## 2014-10-10 NOTE — Assessment & Plan Note (Signed)
Hx of ovarian cysts Pt will f/u with her gyn for eval and likely [place]

## 2014-10-10 NOTE — Progress Notes (Signed)
Pre visit review using our clinic review tool, if applicable. No additional management support is needed unless otherwise documented below in the visit note. 

## 2014-10-12 ENCOUNTER — Ambulatory Visit (INDEPENDENT_AMBULATORY_CARE_PROVIDER_SITE_OTHER): Payer: BC Managed Care – PPO | Admitting: Obstetrics and Gynecology

## 2014-10-12 ENCOUNTER — Encounter: Payer: Self-pay | Admitting: Obstetrics and Gynecology

## 2014-10-12 VITALS — BP 109/66 | HR 65 | Ht 63.0 in | Wt 136.6 lb

## 2014-10-12 DIAGNOSIS — R102 Pelvic and perineal pain: Secondary | ICD-10-CM | POA: Diagnosis not present

## 2014-10-12 NOTE — Assessment & Plan Note (Signed)
Left lower quadrant Pelvic pain  P: pelvic ultrasound around day 20 of current cycle Motrin or tylenol prn Continue to try to achieve pregnancy if desires

## 2014-10-12 NOTE — Progress Notes (Signed)
S: patient reports left lower pelvic pain x 2 months, with LMP 10/01/14 lasting 5 days with normal flow; last two menses occuring 35 & 38 days respecively (is trying to achieve pregnancy after miscarriage in March 2016) Also reports pain with intercourse in same area, regardless of position. Denies dysuria or pain with BM.   Pelvic exam: normal external genitalia, vulva, vagina, cervix, uterus and adnexa, VULVA: normal appearing vulva with no masses, tenderness or lesions, VAGINA: normal appearing vagina with normal color and discharge, no lesions, CERVIX: normal appearing cervix without discharge or lesions, uterus shifted to left with pain on palpation and left adnexa pain, no enlargment.Pelvic exam: UTERUS: uterus is normal size, shape, consistency and tender, ADNEXA: normal adnexa in size, tender on left side and no masses. Left lower quadrant Pelvic pain  P: pelvic ultrasound around day 20 of current cycle Motrin or tylenol prn Continue to try to achieve pregnancy if desires   Timtohy Broski Elissa LovettN Burr, CNM

## 2014-10-22 ENCOUNTER — Encounter: Payer: Self-pay | Admitting: Family Medicine

## 2014-10-25 ENCOUNTER — Encounter: Payer: Self-pay | Admitting: Obstetrics and Gynecology

## 2014-10-25 ENCOUNTER — Ambulatory Visit: Payer: BC Managed Care – PPO

## 2014-10-25 ENCOUNTER — Ambulatory Visit (INDEPENDENT_AMBULATORY_CARE_PROVIDER_SITE_OTHER): Payer: BC Managed Care – PPO | Admitting: Obstetrics and Gynecology

## 2014-10-25 VITALS — BP 106/61 | HR 69 | Ht 63.0 in | Wt 139.1 lb

## 2014-10-25 DIAGNOSIS — N926 Irregular menstruation, unspecified: Secondary | ICD-10-CM | POA: Diagnosis not present

## 2014-10-25 DIAGNOSIS — R102 Pelvic and perineal pain: Secondary | ICD-10-CM | POA: Diagnosis not present

## 2014-11-10 ENCOUNTER — Encounter: Payer: Self-pay | Admitting: Family Medicine

## 2014-11-14 ENCOUNTER — Ambulatory Visit (INDEPENDENT_AMBULATORY_CARE_PROVIDER_SITE_OTHER): Payer: BC Managed Care – PPO | Admitting: Family Medicine

## 2014-11-14 ENCOUNTER — Encounter: Payer: Self-pay | Admitting: Family Medicine

## 2014-11-14 VITALS — BP 112/74 | HR 85 | Temp 98.2°F | Ht 63.0 in | Wt 138.8 lb

## 2014-11-14 DIAGNOSIS — R058 Other specified cough: Secondary | ICD-10-CM | POA: Insufficient documentation

## 2014-11-14 DIAGNOSIS — R05 Cough: Secondary | ICD-10-CM

## 2014-11-14 DIAGNOSIS — J302 Other seasonal allergic rhinitis: Secondary | ICD-10-CM | POA: Diagnosis not present

## 2014-11-14 DIAGNOSIS — J309 Allergic rhinitis, unspecified: Secondary | ICD-10-CM | POA: Insufficient documentation

## 2014-11-14 MED ORDER — MONTELUKAST SODIUM 10 MG PO TABS: 10.0000 mg | ORAL_TABLET | Freq: Every day | ORAL | Status: DC

## 2014-11-14 MED ORDER — ALBUTEROL SULFATE HFA 108 (90 BASE) MCG/ACT IN AERS: 2.0000 | INHALATION_SPRAY | RESPIRATORY_TRACT | Status: DC | PRN

## 2014-11-14 NOTE — Progress Notes (Signed)
Pre visit review using our clinic review tool, if applicable. No additional management support is needed unless otherwise documented below in the visit note. 

## 2014-11-14 NOTE — Assessment & Plan Note (Signed)
Suspect causing cough from post nasal drip  Outdoors a lot - disc allergen avoidance  Since she failed allegra/claritin D and zyrtec-will try singulair  Update if no imp  Also refilled albuterol

## 2014-11-14 NOTE — Assessment & Plan Note (Signed)
Since march on and off  With allergic rhinitis  Will try albuterol and also singular  If no imp - ref to allergist Reassuring exam today

## 2014-11-14 NOTE — Patient Instructions (Signed)
Try the albuterol inhaler when lungs feel tight or you wheeze  Also try singulair one pill daily for allergies (since zyrtec/allegra and claritin did not work) If no improvement in 2 weeks - let me know , we may set you up with an allergist

## 2014-11-14 NOTE — Progress Notes (Signed)
Subjective:    Patient ID: Claudia Rogers, female    DOB: 1985-05-18, 29 y.o.   MRN: [id number]  HPI Here for productive cough   Took augmentin end of may  Improved productive cough some - but not totally (not coughing as much)  Still coughing up mucous that is yellow in color  At times/ low grade temp  Very little wheezing at most  Raspy voice   Recently some sinus pain  Under eyes  Some days of nasal congestion  Post nasal drip   Is outdoors a lot   ? If allergies  Not taking any allergy medicine  occ claritin D  Stopped zyrtec-did not help    Symptoms on and off since march  Nl CXR in march   Patient Active Problem List   Diagnosis Date Noted  . Viral URI with cough 10/10/2014  . Pelvic pain in female 10/10/2014   Past Medical History  Diagnosis Date  . History of chicken pox   . H/O viral meningitis    Past Surgical History  Procedure Laterality Date  . Eye muscle surgery Bilateral 1996  . Eye muscle surgery Bilateral 2000  . Odontoma removal/implant  2003   History  Substance Use Topics  . Smoking status: Never Smoker   . Smokeless tobacco: Never Used  . Alcohol Use: 0.0 oz/week    0 Standard drinks or equivalent per week     Comment: Social   Family History  Problem Relation Age of Onset  . Arthritis Mother   . Arthritis Maternal Grandmother   . Alcoholism Maternal Aunt   . Alcoholism Maternal Uncle   . Alcoholism Cousin   . Lung cancer Father   . Lung cancer Paternal Aunt   . Stroke Maternal Grandmother   . High blood pressure Mother   . High blood pressure Maternal Grandmother   . High blood pressure Sister   . Kidney disease Maternal Aunt   . Mental illness Sister   . Diabetes Mother     Type II  . Diabetes Maternal Grandmother     Type II  . Diabetes Brother     Type II-BKA   No Known Allergies Current Outpatient Prescriptions on File Prior to Visit  Medication Sig Dispense Refill  . Omega-3 Fatty Acids (FISH OIL PO) Take 1  capsule by mouth daily.    . Prenatal Vit-Fe Fumarate-FA (MULTIVITAMIN-PRENATAL) 27-0.8 MG TABS tablet Take 1 tablet by mouth daily at 12 noon.     No current facility-administered medications on file prior to visit.      Review of Systems Review of Systems  Constitutional: Negative for fever, appetite change,  and unexpected weight change.  ENt pos for cong and rhinorrhea and sinus pressure  Eyes: Negative for pain and visual disturbance.  Respiratory: Negative for  shortness of breath.   Cardiovascular: Negative for cp or palpitations    Gastrointestinal: Negative for nausea, diarrhea and constipation.  Genitourinary: Negative for urgency and frequency.  Skin: Negative for pallor or rash   Neurological: Negative for weakness, light-headedness, numbness and headaches.  Hematological: Negative for adenopathy. Does not bruise/bleed easily.  Psychiatric/Behavioral: Negative for dysphoric mood. The patient is not nervous/anxious.         Objective:   Physical Exam  Constitutional: She appears well-developed and well-nourished. No distress.  HENT:  Head: Normocephalic and atraumatic.  Right Ear: External ear normal.  Left Ear: External ear normal.  Mouth/Throat: Oropharynx is clear and moist.  Nares  are injected and congested  No sinus tenderness Clear rhinorrhea and post nasal drip   Eyes: Conjunctivae and EOM are normal. Pupils are equal, round, and reactive to light. Right eye exhibits no discharge. Left eye exhibits no discharge.  Neck: Normal range of motion. Neck supple.  Cardiovascular: Normal rate and normal heart sounds.   Pulmonary/Chest: Effort normal and breath sounds normal. No respiratory distress. She has no wheezes. She has no rales. She exhibits no tenderness.  Lymphadenopathy:    She has no cervical adenopathy.  Neurological: She is alert.  Skin: Skin is warm and dry. No rash noted.  Psychiatric: She has a normal mood and affect.          Assessment &  Plan:   Problem List Items Addressed This Visit    Allergic rhinitis    Suspect causing cough from post nasal drip  Outdoors a lot - disc allergen avoidance  Since she failed allegra/claritin D and zyrtec-will try singulair  Update if no imp  Also refilled albuterol        Productive cough - Primary    Since march on and off  With allergic rhinitis  Will try albuterol and also singular  If no imp - ref to allergist Reassuring exam today

## 2014-12-01 ENCOUNTER — Ambulatory Visit: Payer: Self-pay | Admitting: Obstetrics and Gynecology

## 2014-12-28 ENCOUNTER — Encounter: Payer: Self-pay | Admitting: Family Medicine

## 2014-12-28 ENCOUNTER — Ambulatory Visit (INDEPENDENT_AMBULATORY_CARE_PROVIDER_SITE_OTHER): Payer: BC Managed Care – PPO | Admitting: Family Medicine

## 2014-12-28 VITALS — BP 90/52 | HR 73 | Temp 98.4°F | Ht 63.0 in | Wt 139.2 lb

## 2014-12-28 DIAGNOSIS — M722 Plantar fascial fibromatosis: Secondary | ICD-10-CM | POA: Diagnosis not present

## 2014-12-28 NOTE — Progress Notes (Signed)
Pre visit review using our clinic review tool, if applicable. No additional management support is needed unless otherwise documented below in the visit note. 

## 2014-12-28 NOTE — Patient Instructions (Signed)
Excellent Over the Counter Orthotics:  Hapad: available at www.hapad.com (Green Sports Insoles)  SPENCO: Available at some sports stores or www.amazon.com. (I prefer the full-length green orthotic that has a yellow bottom / base)  www.pedifix.com and "[employer] [facility]" website: multiple good foot products  SHOES: Danskos, Merrells, Keens, Clarks, Birkenstocks - good arch support, want minimal bendability. Finn Comfort also excellent, but even more expensive.  Tennis Shoes / Running Shoes: Many good companies and styles. The most important thing is to get a good fit and wear a shoe that feels good in the store. Walk around in the store. Run if that is something that you can do. Some of the running stores have a treadmill, also.  Brooks, New Balance, Saucony are good, but the most important thing is to wear a shoe that fits your foot and feels good.  [facility] in [place]: excellent staff, shoe selection (Running and Athletic Shoes), OTC orthotics. On [address] across from the [employer]. For running shoe and athletic shoe fit, they are the best.  Nicer shoes:  Birkenstock shoes, [place], [place]: Excellent selection  THE SHOE MARKET, [address] [place], [place]: They have the largest selection of comfort and supportive shoes that I have ever seen, and their staff is generally quite good in fitting you for shoes. (They will intermittently have sales, mail coupons, and you can look on their website.)  [employer]: [address], , [place]    

## 2014-12-28 NOTE — Progress Notes (Signed)
Dr. Karleen Hampshire T. Angeletta Goelz, MD, CAQ Sports Medicine [facility] [address] [place], [address] Phone: [phone] Fax: [phone]  12/28/2014  Patient: Claudia Rogers, MRN: [id number], DOB: 11-Mar-1986, 29 y.o.  Primary Physician:  Roxy Manns, MD  Chief Complaint: Plantar Fasciitis  Subjective:   Claudia Rogers is a 29 y.o. 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 Now with 18 months worth of B plantar fascitis, R > L and she has Arty been doing most of the typical rehabilitation efforts with her foot.  She had one corticosteroid injection done by podiatry which did not seem to help at all.  She had an abrupt onset of pain after she ran a half marathon in 2015 in March.  At that point she was running in minimal issues and had acute pain at her heel and more medially.  This is continued at this point she is really not able to run at all whatsoever.  From a footwear standpoint she typically does worse all can knees, and she has some custom orthotics that are hard and dense, which are not comfortable to do sporting activities.  She is been very diligent with her home exercise program.  She has tried some compression which helps a little bit, and she also has a plantar fascial splint.  Overall, she is somewhat frustrated and would like to have any kind suggestion at all that I can make.  1/2 in march 2015.  No pain at all before her running that 1/2.   When on a break from school.  Now trouble running a mile.  B foot pain.     Past Medical History, Surgical History, Social History, Family History, Problem List, Medications, and Allergies have been reviewed and updated if relevant.  Patient Active Problem List   Diagnosis Date Noted  . Productive cough 11/14/2014  . Allergic rhinitis 11/14/2014  . Viral URI with cough 10/10/2014  . Pelvic pain in female 10/10/2014    Past  Medical History  Diagnosis Date  . History of chicken pox   . H/O viral meningitis     Past Surgical History  Procedure Laterality Date  . Eye muscle surgery Bilateral 1996  . Eye muscle surgery Bilateral 2000  . Odontoma removal/implant  2003    Social History   Social History  . Marital Status: Married    Spouse Name: N/A  . Number of Children: N/A  . Years of Education: N/A   Occupational History  . Not on file.   Social History Main Topics  . Smoking status: Never Smoker   . Smokeless tobacco: Never Used  . Alcohol Use: 0.0 oz/week    0 Standard drinks or equivalent per week     Comment: Social  . Drug Use: No  . Sexual Activity: Yes    Birth Control/ Protection: None   Other Topics Concern  . Not on file   Social History Narrative    Family History  Problem Relation Age of Onset  . Arthritis Mother   . Arthritis Maternal Grandmother   . Alcoholism Maternal Aunt   . Alcoholism Maternal Uncle   . Alcoholism Cousin   . Lung cancer Father   . Lung cancer Paternal Aunt   . Stroke Maternal Grandmother   . High blood pressure Mother   . High blood pressure Maternal Grandmother   . High blood  pressure Sister   . Kidney disease Maternal Aunt   . Mental illness Sister   . Diabetes Mother     Type II  . Diabetes Maternal Grandmother     Type II  . Diabetes Brother     Type II-BKA    No Known Allergies  Medication list reviewed and updated in full in [facility].  GEN: No fevers, chills. Nontoxic. Primarily MSK c/o today. MSK: Detailed in the HPI GI: tolerating PO intake without difficulty Neuro: No numbness, parasthesias, or tingling associated. Otherwise the pertinent positives of the ROS are noted above.   Objective:   BP 90/52 mmHg  Pulse 73  Temp(Src) 98.4 F (36.9 C) (Oral)  Ht  (1.6 m)  Wt 139 lb 4 oz (63.163 kg)  BMI 24.67 kg/m2  LMP 12/05/2014   GEN: WDWN, NAD, Non-toxic, Alert & Oriented x 3 HEENT: Atraumatic,  Normocephalic.  Ears and Nose: No external deformity. EXTR: No clubbing/cyanosis/edema NEURO: Normal gait.  PSYCH: Normally interactive. Conversant. Not depressed or anxious appearing.  Calm demeanor.   FEET: b Echymosis: no Edema: no ROM: full LE B Gait: heel toe, non-antalgic MT pain: no Callus pattern: none Lateral Mall: NT Medial Mall: NT Talus: NT Navicular: NT Cuboid: NT Calcaneous: TTP medially at PF on R Metatarsals: NT 5th MT: NT Phalanges: NT Achilles: NT Plantar Fascia: TTP medially on R and with dorsiflexion Fat Pad: NT Peroneals: NT Post Tib: NT Great Toe: Nml motion Ant Drawer: neg ATFL: NT CFL: NT Deltoid: NT Other foot breakdown: none Long arch: preserved - cavus Transverse arch: mild breakdown Hindfoot breakdown: none Sensation: intact   Radiology: No results found.  Assessment and Plan:   Plantar fasciitis, right  We reviewed that stretching is critically important to the treatment of PF. Reviewed footwear. Rigid soles have been shown to help with PF.  She is Arty been doing most standard treatment with failure to improve after 18 months.  I think that her rigid orthotics are likely not helping all that much.  A cushioned athletic orthotic would likely make a difference, but for now she is going to hold on doing custom orthotics secondary to cost.  Foot tech or [employer] would likely help.  Custom made a heel pad out of poron for her to be placed on a sports insole base. Given arch binders.  I reviewed many types of footwear with the patient, going over brands for work and athletics.  Rigid midfoot would likely help.  WIth foot structure, I think Dansko clogs would be a fairly ideal choice, too.   F/u prn  Patient Instructions  Excellent Over the Counter Orthotics:  Hapad: available at www.hapad.com (Green Sports Insoles)  SPENCO: Available at some sports stores or GoalForum.com.au. (I prefer the full-length green orthotic that has a yellow  bottom / base)  www.pedifix.com and [employer]" website: multiple good foot products  SHOES: Danskos, Merrells, Keens, Clarks, Birkenstocks - good arch support, want minimal bendability. Finn Comfort also excellent, but even more expensive.  Tennis Shoes / Running Shoes: Many good companies and styles. The most important thing is to get a good fit and wear a shoe that feels good in the store. Walk around in the store. Run if that is something that you can do. Some of the running stores have a treadmill, also.  Brooks, New Balance, Andrena Mews are good, but the most important thing is to wear a shoe that fits your foot and feels good.  [facility] in [place]: excellent staff, shoe selection (Running and Athletic Shoes), OTC orthotics. On [address] across from the [employer]. For running shoe and athletic shoe fit, they are the best.  Nicer shoes:  Birkenstock shoes, [employer], [place]: Excellent selection  [employer], [address] [place], [place]: They have the largest selection of comfort and supportive shoes that I have ever seen, and their staff is generally quite good in fitting you for shoes. (They will intermittently have sales, mail coupons, and you can look on their website.)  [employer]: [address] [place], [place]        Signed,  [place] T. Adian Jablonowski, MD   Patient's Medications  New Prescriptions   No medications on file  Previous Medications   PRENATAL VIT-FE FUMARATE-FA (MULTIVITAMIN-PRENATAL) 27-0.8 MG TABS TABLET    Take 1 tablet by mouth daily at 12 noon.  Modified Medications   No medications on file  Discontinued Medications   ALBUTEROL (PROVENTIL HFA;VENTOLIN HFA) 108 (90 BASE) MCG/ACT INHALER    Inhale 2 puffs into the lungs every 4 (four) hours as needed for wheezing.   MONTELUKAST (SINGULAIR) 10 MG TABLET    Take 1 tablet (10 mg total) by mouth at bedtime.   OMEGA-3 FATTY ACIDS (FISH OIL PO)    Take 1 capsule by mouth  daily.

## 2015-01-11 ENCOUNTER — Other Ambulatory Visit: Payer: Self-pay | Admitting: *Deleted

## 2015-01-11 ENCOUNTER — Other Ambulatory Visit: Payer: BC Managed Care – PPO

## 2015-01-11 ENCOUNTER — Telehealth: Payer: Self-pay | Admitting: Obstetrics and Gynecology

## 2015-01-11 DIAGNOSIS — N926 Irregular menstruation, unspecified: Secondary | ICD-10-CM

## 2015-01-11 NOTE — Telephone Encounter (Signed)
Pt miscarried a few mons ago. Period has been normal... They are getting later and later, this time 38 days and she is not bleeding like a normal period. It's brown and very little. Will you call her

## 2015-01-11 NOTE — Telephone Encounter (Signed)
Have pt come by and get lab drawn per MNB

## 2015-01-12 LAB — BETA HCG QUANT (REF LAB): hCG Quant: <1 m[IU]/mL

## 2015-01-16 NOTE — Telephone Encounter (Signed)
-----   Message from Ulyses Amor, [place] sent at 01/16/2015 10:23 AM EDT ----- Please let her know her blood pregnancy test is negative

## 2015-01-16 NOTE — Telephone Encounter (Signed)
Notified pt of lab results 

## 2015-01-25 ENCOUNTER — Ambulatory Visit: Payer: BC Managed Care – PPO | Admitting: Obstetrics and Gynecology

## 2015-01-29 ENCOUNTER — Encounter: Payer: Self-pay | Admitting: Obstetrics and Gynecology

## 2015-01-29 NOTE — Progress Notes (Signed)
US Transvaginal Non-OB   Status: Final result       Study Result     ULTRASOUND REPORT  Location: [facility] Date of Service:    Indications:Pelvic Pain Findings:  The uterus measures 6.8 x 3.4 x 4.1 cm. Echo texture is homogenous without evidence of focal masses.  The Endometrium measures 6.5 mm.  Right Ovary measures 3.6 x 2.1 x 2.9 cm. It is normal in appearance. Left Ovary measures 4.0 x 2.1 x 2.8 cm. Small exophytic cyst 1.1 x 0.7 x 1.0 cm Survey of the adnexa demonstrates no adnexal masses. There is no free fluid in the cul de sac.  Impression: 1. Normal gyn scan  Recommendations: 1.Clinical correlation with the patient's History and Physical Exam.   Lenoria Farrier, Rad Tech   Reviewed scan and agree with findings  Yolanda Bonine, CNM          Result History     US Transvaginal Non-OB (Order #[id number]) on 11/14/2014 - Order Result History Report          Vitals     Height Weight BMI (Calculated)     (1.6 m) 138 lb 12 oz (62.937 kg) 24.6      Interpretation Summary     ULTRASOUND REPORT  Location: [facility] Date of Service:    Indications:Pelvic Pain Findings:  The uterus measures 6.8 x 3.4 x 4.1 cm. Echo texture is homogenous without evidence of focal masses.  The Endometrium measures 6.5 mm.  Right Ovary measures 3.6 x 2.1 x 2.9 cm. It is normal in appearance. Left Ovary measures 4.0 x 2.1 x 2.8 cm. Small exophytic cyst 1.1 x 0.7 x 1.0 cm Survey of the adnexa demonstrates no adnexal masses. There is no free fluid in the cul de sac.  Impression: 1. Normal gyn scan  Recommendations: 1.Clinical correlation with the patient's History and Physical Exam.   Lenoria Farrier, Rad Tech   Reviewed scan and agree with findings  Yolanda Bonine, CNM          External Result Report     External Result Report     Imaging     Imaging Information     Signed by     Signed Date/Time   Phone Pager    Ulyses Amor 11/14/2014 2:07 PM [phone]       Exam Information     Status Exam Begun   Exam Ended      Final [99] 10/25/2014 10:59 AM 10/25/2014 11:15 AM      Signed     Electronically signed by Ulyses Amor, CNM on 11/14/14 at 1407 EDT     Original Order     Ordered On Ordered By      10/12/2014 3:50 PM Melody Elissa Lovett, CNM            US Transvaginal Non-OB (Order [id number])  Imaging  Order: [id number]   Released By: Bobbe Medico  Authorizing: Ulyses Amor, CNM   Date: 10/25/2014  Department: [facility]       Order Information     Order Date/Time Release Date/Time Start Date/Time End Date/Time    10/12/14 03:50 PM 10/25/14 10:46 AM 10/25/14 10:46 AM None      Order Details     Frequency Duration Priority Order Class    None None Routine Ancillary Performed      Order Questions     Question Answer Comment    Reason for Exam (SYMPTOM  OR DIAGNOSIS REQUIRED) LLQ pain     Note:  Appropriate reason for exam on pre-op X-rays should indicate the reason for the surgery. Examples include: Coronary Artery disease, osteoartiritis of the knee, brain tumor, etc.    Preferred imaging location? Internal       Order History  Outpatient    Date/Time Action Taken User Additional Information    0000 Result De Nurse, [place] Tech In process    10/25/14 1046 Release Amy Sherryle Lis FromOrder:[id number]    10/25/14 1115 Result Amber Charma Igo, Rad Tech In process    11/14/14 1406 Result Melody Elissa Lovett, CNM Final      Imaging CC Recipients       Associated Diagnoses       ICD-9-CM ICD-10-CM    Pelvic pain in female    625.9 R10.2      Reviewed by List     Ulyses Amor, CNM on 11/14/2014 2:56 PM     Order Katsumi Wisler Info       Office phone Pager/beeper E-mail    Ordering User Melody Olney, [place] [phone] -- --    Authorizing Dorathea Faerber Melody Elissa Lovett, CNM  [phone] -- --    Attending Ramsey Guadamuz Melody Elissa Lovett, CNM [phone] -- --    Billing Valecia Beske Melody Elissa Lovett, CNM [phone] -- --      Reprint Requisition     US TRANSVAGINAL NON-OB (Order #[id number]) on 10/25/14     Original Order     Ordered On Ordered By      10/12/2014 3:50 PM Melody Elissa Lovett, CNM

## 2015-05-13 NOTE — L&D Delivery Note (Signed)
Delivery Summary for Claudia Rogers  Labor Events:   Preterm labor:   Rupture date:   Rupture time:   Rupture type: Bulging bag of water  Fluid Color: Bloody  Induction:   Augmentation:   Complications:   Cervical ripening:          Delivery:   Episiotomy:   Lacerations:   Repair suture:   Repair # of packets:   Blood loss (ml): 400   Information for the patient's newborn:  Claudia ReiningWhite, Claudia Rogers [[id number]][[id number]]    Delivery 04/11/2016 1:16 AM by  Vaginal, Spontaneous Delivery Sex:  female Gestational Age: 3769w2d Delivery Clinician:   Living?:         APGARS  One minute Five minutes Ten minutes  Skin color:        Heart rate:        Grimace:        Muscle tone:        Breathing:        Totals: 8  9      Presentation/position:      Resuscitation:   Cord information:    Disposition of cord blood:     Blood gases sent?  Complications:   Placenta: Delivered:       appearance Newborn Measurements: Weight: 7 lb 4.4 oz (3300 g)  Height: 20.67"  Head circumference:    Chest circumference:    Other providers:    Additional  information: Forceps:   Vacuum:   Breech:   Observed anomalies         Delivery Note At 1:16 AM a viable and healthy female "Claudia Rogers" was delivered via Vaginal, Spontaneous Delivery (Presentation: Vertex; LOA position).  APGAR: 8, 9; weight 3300 grams.   Placenta status: spontaneously removed, intact.  Cord: 3-vessel, with the following complications: tight nuchal cord x 1, non-reducible, clamped x 2 and cut at perineum.  Cord pH: not obtained.  Cord blood collected for cord blood banking.   Anesthesia:  Epidural Episiotomy:  None Lacerations:  1st degree perineal, hemostatic Suture Repair: None Est. Blood Loss (mL):  400   Mom to postpartum.  Baby to Couplet care / Skin to Skin.  Claudia Rogers 04/11/2016, 1:56 AM

## 2015-06-20 ENCOUNTER — Telehealth: Payer: Self-pay | Admitting: Obstetrics and Gynecology

## 2015-06-20 ENCOUNTER — Other Ambulatory Visit: Payer: Self-pay | Admitting: Obstetrics and Gynecology

## 2015-06-20 ENCOUNTER — Ambulatory Visit (INDEPENDENT_AMBULATORY_CARE_PROVIDER_SITE_OTHER): Payer: BC Managed Care – PPO | Admitting: Obstetrics and Gynecology

## 2015-06-20 VITALS — Temp 98.8°F

## 2015-06-20 DIAGNOSIS — N926 Irregular menstruation, unspecified: Secondary | ICD-10-CM

## 2015-06-20 DIAGNOSIS — M545 Low back pain, unspecified: Secondary | ICD-10-CM

## 2015-06-20 LAB — POCT URINALYSIS DIPSTICK
BILIRUBIN UA: NEGATIVE
Glucose, UA: NEGATIVE
Leukocytes, UA: NEGATIVE
Nitrite, UA: NEGATIVE
Protein, UA: NEGATIVE
RBC UA: NEGATIVE
Spec Grav, UA: 1.01
Urobilinogen, UA: NEGATIVE
pH, UA: 8.5

## 2015-06-20 NOTE — Telephone Encounter (Signed)
Called pt and she is coming in 06/20/15

## 2015-06-20 NOTE — Progress Notes (Signed)
Patient ID: Claudia Rogers, female   DOB: 1986-04-15, 30 y.o.   MRN: [id number] Pt c/o severe back pain, and had brown urine this am. Urine yellow this pm. Urinalysis +mod ketones. T-98.8 (o) pt states she was pregnant last time this happened. Home pregnancy test negative, again like before upt negative and bhcg was positive. Bhcg today. No history of kidney stones.  MNS advised pt also get CBC today. Urine culture sent. Pt to contact office if no better and take Tylenol ES for pain, heating pad.

## 2015-06-20 NOTE — Telephone Encounter (Signed)
This pt has an appt Friday for AE. She is experiencing lower back pain that tylenol is not touch it. Her pee was brown this morn, but is in no pain. She did not start her period. She took a urine preg test but was neg. Would it be poss for her to come in for urine drop off for UTI and get a serum beta hgb. Let me know and Ill call her back

## 2015-06-21 ENCOUNTER — Telehealth: Payer: Self-pay | Admitting: Obstetrics and Gynecology

## 2015-06-21 LAB — CBC WITH DIFFERENTIAL/PLATELET
BASOS ABS: 0 10*3/uL (ref 0.0–0.2)
Basos: 0 %
EOS (ABSOLUTE): 0.1 10*3/uL (ref 0.0–0.4)
Eos: 1 %
Hematocrit: 37.2 % (ref 34.0–46.6)
Hemoglobin: 12.4 g/dL (ref 11.1–15.9)
IMMATURE GRANS (ABS): 0 10*3/uL (ref 0.0–0.1)
Immature Granulocytes: 0 %
LYMPHS: 35 %
Lymphocytes Absolute: 3.1 10*3/uL (ref 0.7–3.1)
MCH: 32 pg (ref 26.6–33.0)
MCHC: 33.3 g/dL (ref 31.5–35.7)
MCV: 96 fL (ref 79–97)
MONOS ABS: 0.7 10*3/uL (ref 0.1–0.9)
Monocytes: 8 %
NEUTROS ABS: 4.8 10*3/uL (ref 1.4–7.0)
Neutrophils: 56 %
PLATELETS: 319 10*3/uL (ref 150–379)
RBC: 3.88 x10E6/uL (ref 3.77–5.28)
RDW: 12.4 % (ref 12.3–15.4)
WBC: 8.7 10*3/uL (ref 3.4–10.8)

## 2015-06-21 LAB — URINE CULTURE: Organism ID, Bacteria: NO GROWTH

## 2015-06-21 LAB — BETA HCG QUANT (REF LAB)

## 2015-06-21 NOTE — Telephone Encounter (Signed)
PT CALLED AND WANTED TO KNOW THE RESULTS OF BLOOD WORK IF THEY WERE BACK

## 2015-06-22 ENCOUNTER — Other Ambulatory Visit: Payer: Self-pay | Admitting: Obstetrics and Gynecology

## 2015-06-22 ENCOUNTER — Encounter: Payer: Self-pay | Admitting: Obstetrics and Gynecology

## 2015-06-22 ENCOUNTER — Ambulatory Visit (INDEPENDENT_AMBULATORY_CARE_PROVIDER_SITE_OTHER): Payer: BC Managed Care – PPO | Admitting: Obstetrics and Gynecology

## 2015-06-22 VITALS — BP 111/66 | HR 74 | Ht 63.0 in | Wt 140.5 lb

## 2015-06-22 DIAGNOSIS — N946 Dysmenorrhea, unspecified: Secondary | ICD-10-CM | POA: Diagnosis not present

## 2015-06-22 DIAGNOSIS — Z01419 Encounter for gynecological examination (general) (routine) without abnormal findings: Secondary | ICD-10-CM | POA: Diagnosis not present

## 2015-06-22 NOTE — Telephone Encounter (Signed)
Pt was seen 06/22/15

## 2015-06-22 NOTE — Patient Instructions (Signed)
Place annual gynecologic exam patient instructions here.

## 2015-06-22 NOTE — Progress Notes (Signed)
  Subjective:     Claudia Rogers is a 30 y.o. female and is here for a comprehensive physical exam. The patient reports no pregnancy since miscarriage in march 2016, reports menses occuring q29-37 days with ovulation predictor kit + days 17-22.Marland Kitchen  Social History   Social History  . Marital Status: Married    Spouse Name: N/A  . Number of Children: N/A  . Years of Education: N/A   Occupational History  . Not on file.   Social History Main Topics  . Smoking status: Never Smoker   . Smokeless tobacco: Never Used  . Alcohol Use: 0.0 oz/week    0 Standard drinks or equivalent per week     Comment: Social  . Drug Use: No  . Sexual Activity: Yes    Birth Control/ Protection: None   Other Topics Concern  . Not on file   Social History Narrative   Health Maintenance  Topic Date Due  . HIV Screening  10/21/2001  . TETANUS/TDAP  10/21/2005  . INFLUENZA VACCINE  06/13/2020 (Originally 12/11/2014)  . PAP SMEAR  01/11/2017    The following portions of the patient's history were reviewed and updated as appropriate: allergies, current medications, past family history, past medical history, past social history, past surgical history and problem list.  Review of Systems Pertinent items noted in HPI and remainder of comprehensive ROS otherwise negative.   Objective:    General appearance: alert, cooperative and appears stated age Neck: no adenopathy, no carotid bruit, no JVD, supple, symmetrical, trachea midline and thyroid not enlarged, symmetric, no tenderness/mass/nodules Lungs: clear to auscultation bilaterally Breasts: normal appearance, no masses or tenderness Heart: regular rate and rhythm, S1, S2 normal, no murmur, click, rub or gallop Abdomen: soft, non-tender; bowel sounds normal; no masses,  no organomegaly Pelvic: cervix normal in appearance, external genitalia normal, no adnexal masses or tenderness, no cervical motion tenderness, rectovaginal septum normal, uterus normal  size, shape, and consistency and vagina normal without discharge    Assessment:    Healthy female exam. Preconception counseling      Plan:  Pap obtained Will plan labs on day 22-24 of next cycle, and also consider semen analysis if not pregnant in next 3 months.   See After Visit Summary for Counseling Recommendations

## 2015-06-25 ENCOUNTER — Encounter: Payer: Self-pay | Admitting: Obstetrics and Gynecology

## 2015-06-25 LAB — CYTOLOGY - PAP

## 2015-06-27 ENCOUNTER — Other Ambulatory Visit: Payer: BC Managed Care – PPO

## 2015-06-27 ENCOUNTER — Other Ambulatory Visit: Payer: Self-pay | Admitting: Obstetrics and Gynecology

## 2015-06-27 DIAGNOSIS — N979 Female infertility, unspecified: Secondary | ICD-10-CM

## 2015-07-13 ENCOUNTER — Other Ambulatory Visit: Payer: Self-pay | Admitting: Obstetrics and Gynecology

## 2015-07-13 DIAGNOSIS — O3680X0 Pregnancy with inconclusive fetal viability, not applicable or unspecified: Secondary | ICD-10-CM

## 2015-07-18 ENCOUNTER — Ambulatory Visit (INDEPENDENT_AMBULATORY_CARE_PROVIDER_SITE_OTHER): Payer: BC Managed Care – PPO

## 2015-07-18 ENCOUNTER — Other Ambulatory Visit: Payer: BC Managed Care – PPO

## 2015-07-18 DIAGNOSIS — Z01419 Encounter for gynecological examination (general) (routine) without abnormal findings: Secondary | ICD-10-CM

## 2015-07-18 DIAGNOSIS — O3680X0 Pregnancy with inconclusive fetal viability, not applicable or unspecified: Secondary | ICD-10-CM

## 2015-07-19 LAB — ESTRADIOL: Estradiol: 66.9 pg/mL

## 2015-07-19 LAB — FSH/LH
FSH: 4.2 m[IU]/mL
LH: 18.1 m[IU]/mL

## 2015-07-19 LAB — PROGESTERONE: PROGESTERONE: 6.5 ng/mL

## 2015-07-20 ENCOUNTER — Encounter: Payer: Self-pay | Admitting: Obstetrics and Gynecology

## 2015-07-24 ENCOUNTER — Encounter: Payer: BC Managed Care – PPO | Admitting: Obstetrics and Gynecology

## 2015-07-24 ENCOUNTER — Other Ambulatory Visit: Payer: Self-pay | Admitting: Obstetrics and Gynecology

## 2015-07-24 MED ORDER — CLOMIPHENE CITRATE 50 MG PO TABS: 50.0000 mg | ORAL_TABLET | Freq: Every day | ORAL | Status: DC

## 2015-07-31 ENCOUNTER — Telehealth: Payer: Self-pay | Admitting: Obstetrics and Gynecology

## 2015-07-31 ENCOUNTER — Other Ambulatory Visit: Payer: BC Managed Care – PPO

## 2015-07-31 ENCOUNTER — Other Ambulatory Visit: Payer: Self-pay | Admitting: *Deleted

## 2015-07-31 DIAGNOSIS — N926 Irregular menstruation, unspecified: Secondary | ICD-10-CM

## 2015-07-31 NOTE — Telephone Encounter (Signed)
PT WAS TOLD TO CALL US WHEN SHE GOT A POSITIVE PREG TET AT HOME SHE STATED [facility]EL[facility] WANTED HER TO CALL US ASAP SO SHE CAN COME IN AND DO BLOOD WORK, I HAVE HER ON THE SCHEDULE THIS MORNING TO COME IN FOR BLOOD WORK BUT NOT SURE IF THE ORDER IS IN AND NOT SURE ALL BLOOD WORK THE [facility] WANTS

## 2015-07-31 NOTE — Telephone Encounter (Signed)
Orders put in for 07/31/15

## 2015-08-01 LAB — BETA HCG QUANT (REF LAB): HCG QUANT: 565 m[IU]/mL

## 2015-08-01 LAB — PROGESTERONE: PROGESTERONE: 17.9 ng/mL

## 2015-08-02 ENCOUNTER — Other Ambulatory Visit: Payer: BC Managed Care – PPO

## 2015-08-02 ENCOUNTER — Other Ambulatory Visit: Payer: Self-pay | Admitting: Obstetrics and Gynecology

## 2015-08-03 ENCOUNTER — Other Ambulatory Visit: Payer: BC Managed Care – PPO

## 2015-08-03 ENCOUNTER — Other Ambulatory Visit: Payer: Self-pay | Admitting: Obstetrics and Gynecology

## 2015-08-03 DIAGNOSIS — N926 Irregular menstruation, unspecified: Secondary | ICD-10-CM

## 2015-08-03 LAB — PROGESTERONE: Progesterone: 13.2 ng/mL

## 2015-08-03 LAB — BETA HCG QUANT (REF LAB): HCG QUANT: 1692 m[IU]/mL

## 2015-08-03 MED ORDER — PROGESTERONE 200 MG VA SUPP: 200.0000 mg | Freq: Every day | VAGINAL | Status: DC

## 2015-08-06 NOTE — Progress Notes (Signed)
PT IS SCHEDULED FOR 08/15/15 FOR [place]

## 2015-08-08 ENCOUNTER — Encounter: Payer: Self-pay | Admitting: Obstetrics and Gynecology

## 2015-08-15 ENCOUNTER — Ambulatory Visit (INDEPENDENT_AMBULATORY_CARE_PROVIDER_SITE_OTHER): Payer: BC Managed Care – PPO

## 2015-08-15 ENCOUNTER — Other Ambulatory Visit: Payer: Self-pay | Admitting: Obstetrics and Gynecology

## 2015-08-15 DIAGNOSIS — N926 Irregular menstruation, unspecified: Secondary | ICD-10-CM

## 2015-08-16 LAB — BETA HCG QUANT (REF LAB): HCG QUANT: 51325 m[IU]/mL

## 2015-08-21 ENCOUNTER — Encounter: Payer: Self-pay | Admitting: Obstetrics and Gynecology

## 2015-08-23 ENCOUNTER — Ambulatory Visit (INDEPENDENT_AMBULATORY_CARE_PROVIDER_SITE_OTHER): Payer: BC Managed Care – PPO | Admitting: Obstetrics and Gynecology

## 2015-08-23 VITALS — BP 109/64 | HR 77 | Wt 147.4 lb

## 2015-08-23 DIAGNOSIS — Z113 Encounter for screening for infections with a predominantly sexual mode of transmission: Secondary | ICD-10-CM

## 2015-08-23 DIAGNOSIS — Z36 Encounter for antenatal screening of mother: Secondary | ICD-10-CM

## 2015-08-23 DIAGNOSIS — Z1389 Encounter for screening for other disorder: Secondary | ICD-10-CM

## 2015-08-23 DIAGNOSIS — Z331 Pregnant state, incidental: Secondary | ICD-10-CM

## 2015-08-23 DIAGNOSIS — Z369 Encounter for antenatal screening, unspecified: Secondary | ICD-10-CM

## 2015-08-23 DIAGNOSIS — Z3490 Encounter for supervision of normal pregnancy, unspecified, unspecified trimester: Secondary | ICD-10-CM

## 2015-08-23 LAB — OB RESULTS CONSOLE VARICELLA ZOSTER ANTIBODY, IGG: VARICELLA IGG: NON-IMMUNE/NOT IMMUNE

## 2015-08-23 NOTE — Progress Notes (Signed)
Claudia Rogers presents for NOB nurse interview visit. G-2.  [id number].  Pt had ultrasound on 08/15/15 for viability and dating. EDD 04/02/2016. Pt having lots of nausea but does not desire anything for it at this time.  Pregnancy education material explained and given. No cats in the home. NOB labs ordered.  HIV labs and Drug screen were explained optional and she could opt out of tests but did not decline. Drug screen ordered. PNV encouraged. NT to discuss with provider. Pt. To follow up with provider in 3 weeks for NOB physical.  All questions answered.    ZIKA EXPOSURE SCREEN:  The patient has not traveled to a [place] endemic area within the past 6 months, nor has she had unprotected sex with a partner who has travelled to a [place] endemic region within the past 6 months. The patient has been advised to notify us if these factors change any time during this current pregnancy, so adequate testing and monitoring can be initiated.

## 2015-08-23 NOTE — Patient Instructions (Signed)
Pregnancy and Zika Virus Disease Zika virus disease, or Zika, is an illness that can spread to people from mosquitoes that carry the virus. It may also spread from person to person through infected body fluids. Zika first occurred in [place], but recently it has spread to new areas. The virus occurs in tropical climates. The location of Zika continues to change. Most people who become infected with Zika virus do not develop serious illness. However, Zika may cause birth defects in an unborn baby whose mother is infected with the virus. It may also increase the risk of miscarriage. WHAT ARE THE SYMPTOMS OF ZIKA VIRUS DISEASE? In many cases, people who have been infected with Zika virus do not develop any symptoms. If symptoms appear, they usually start about a week after the person is infected. Symptoms are usually mild. They may include:  Fever.  Rash.  Red eyes.  Joint pain. HOW DOES ZIKA VIRUS DISEASE SPREAD? The main way that Zika virus spreads is through the bite of a certain type of mosquito. Unlike most types of mosquitos, which bite only at night, the type of mosquito that carries Zika virus bites both at night and during the day. Zika virus can also spread through sexual contact, through a blood transfusion, and from a mother to her baby before or during birth. Once you have had Zika virus disease, it is unlikely that you will get it again. CAN I PASS ZIKA TO MY BABY DURING PREGNANCY? Yes, Zika can pass from a mother to her baby before or during birth. WHAT PROBLEMS CAN ZIKA CAUSE FOR MY BABY? A woman who is infected with Zika virus while pregnant is at risk of having her baby born with a condition in which the brain or head is smaller than expected (microcephaly). Babies who have microcephaly can have developmental delays, seizures, hearing problems, and vision problems. Having Zika virus disease during pregnancy can also increase the risk of miscarriage. HOW CAN ZIKA VIRUS DISEASE BE  PREVENTED? There is no vaccine to prevent Zika. The best way to prevent the disease is to avoid infected mosquitoes and avoid exposure to body fluids that can spread the virus. Avoid any possible exposure to Zika by taking the following precautions. For women and their sex partners:  Avoid traveling to high-risk areas. The locations where Zika is being reported change often. To identify high-risk areas, check the CDC travel website: www.cdc.gov/zika/geo/index.html  If you or your sex partner must travel to a high-risk area, talk with a health care provider before and after traveling.  Take all precautions to avoid mosquito bites if you live in, or travel to, any of the high-risk areas. Insect repellents are safe to use during pregnancy.  Ask your health care provider when it is safe to have sexual contact. For women:  If you are pregnant or trying to become pregnant, avoid sexual contact with persons who may have been exposed to Zika virus, persons who have possible symptoms of Zika, or persons whose history you are unsure about. If you choose to have sexual contact with someone who may have been exposed to Zika virus, use condoms correctly during the entire duration of sexual activity, every time. Do not share sexual devices, as you may be exposed to body fluids.  Ask your health care provider about when it is safe to attempt pregnancy after a possible exposure to Zika virus. WHAT STEPS SHOULD I TAKE TO AVOID MOSQUITO BITES? Take these steps to avoid mosquito bites when you are   in a high-risk area:  Wear loose clothing that covers your arms and legs.  Limit your outdoor activities.  Do not open windows unless they have window screens.  Sleep under mosquito nets.  Use insect repellent. The best insect repellents have:  DEET, picaridin, oil of lemon eucalyptus (OLE), or IR3535 in them.  Higher amounts of an active ingredient in them.  Remember that insect repellents are safe to use  during pregnancy.  Do not use OLE on children who are younger than 3 years of age. Do not use insect repellent on babies who are younger than 2 months of age.  Cover your child's stroller with mosquito netting. Make sure the netting fits snugly and that any loose netting does not cover your child's mouth or nose. Do not use a blanket as a mosquito-protection cover.  Do not apply insect repellent underneath clothing.  If you are using sunscreen, apply the sunscreen before applying the insect repellent.  Treat clothing with permethrin. Do not apply permethrin directly to your skin. Follow label directions for safe use.  Get rid of standing water, where mosquitoes may reproduce. Standing water is often found in items such as buckets, bowls, animal food dishes, and flowerpots. When you return from traveling to any high-risk area, continue taking actions to protect yourself against mosquito bites for 3 weeks, even if you show no signs of illness. This will prevent spreading Zika virus to uninfected mosquitoes. WHAT SHOULD I KNOW ABOUT THE SEXUAL TRANSMISSION OF ZIKA? People can spread Zika to their sexual partners during vaginal, anal, or oral sex, or by sharing sexual devices. Many people with Zika do not develop symptoms, so a person could spread the disease without knowing that they are infected. The greatest risk is to women who are pregnant or who may become pregnant. Zika virus can live longer in semen than it can live in blood. Couples can prevent sexual transmission of the virus by:  Using condoms correctly during the entire duration of sexual activity, every time. This includes vaginal, anal, and oral sex.  Not sharing sexual devices. Sharing increases your risk of being exposed to body fluid from another person.  Avoiding all sexual activity until your health care provider says it is safe. SHOULD I BE TESTED FOR ZIKA VIRUS? A sample of your blood can be tested for Zika virus. A pregnant  woman should be tested if she may have been exposed to the virus or if she has symptoms of Zika. She may also have additional tests done during her pregnancy, such ultrasound testing. Talk with your health care provider about which tests are recommended.   This information is not intended to replace advice given to you by your health care provider. Make sure you discuss any questions you have with your health care provider.   Document Released: 01/17/2015 Document Reviewed: 01/10/2015 Elsevier Interactive Patient Education 2016 Else[employer]. Minor Illnesses and Medications in Pregnancy  Cold/Flu:  Sudafed for congestion- Robitussin (plain) for cough- Tylenol for discomfort.  Please follow the directions on the label.  Try not to take any more than needed.  OTC Saline nasal spray and air humidifier or cool-mist  Vaporizer to sooth nasal irritation and to loosen congestion.  It is also important to increase intake of non carbonated fluids, especially if you have a fever.  Constipation:  Colace-2 capsules at bedtime; Metamucil- follow directions on label; Senokot- 1 tablet at bedtime.  Any one of these medications can be used.  It is also   very important to increase fluids and fruits along with regular exercise.  If problem persists please call the office.  Diarrhea:  Kaopectate as directed on the label.  Eat a bland diet and increase fluids.  Avoid highly seasoned foods.  Headache:  Tylenol 1 or 2 tablets every 3-4 hours as needed  Indigestion:  Maalox, Mylanta, Tums or Rolaids- as directed on label.  Also try to eat small meals and avoid fatty, greasy or spicy foods.  Nausea with or without Vomiting:  Nausea in pregnancy is caused by increased levels of hormones in the body which influence the digestive system and cause irritation when stomach acids accumulate.  Symptoms usually subside after 1st trimester of pregnancy.  Try the following:  Keep saltines, graham crackers or dry toast by your bed  to eat upon awakening.  Don't let your stomach get empty.  Try to eat 5-6 small meals per day instead of 3 large ones.  Avoid greasy fatty or highly seasoned foods.   Take OTC Unisom 1 tablet at bed time along with OTC Vitamin B6 25-50 mg 3 times per day.    If nausea continues with vomiting and you are unable to keep down food and fluids you may need a prescription medication.  Please notify your provider.   Sore throat:  Chloraseptic spray, throat lozenges and or plain Tylenol.  Vaginal Yeast Infection:  OTC Monistat for 7 days as directed on label.  If symptoms do not resolve within a week notify provider.  If any of the above problems do not subside with recommended treatment please call the office for further assistance.   Do not take Aspirin, Advil, Motrin or Ibuprofen.  * * OTC= Over the counter Hyperemesis Gravidarum Hyperemesis gravidarum is a severe form of nausea and vomiting that happens during pregnancy. Hyperemesis is worse than morning sickness. It may cause you to have nausea or vomiting all day for many days. It may keep you from eating and drinking enough food and liquids. Hyperemesis usually occurs during the first half (the first 20 weeks) of pregnancy. It often goes away once a woman is in her second half of pregnancy. However, sometimes hyperemesis continues through an entire pregnancy.  CAUSES  The cause of this condition is not completely known but is thought to be related to changes in the body's hormones when pregnant. It could be from the high level of the pregnancy hormone or an increase in estrogen in the body.  SIGNS AND SYMPTOMS   Severe nausea and vomiting.  Nausea that does not go away.  Vomiting that does not allow you to keep any food down.  Weight loss and body fluid loss (dehydration).  Having no desire to eat or not liking food you have previously enjoyed. DIAGNOSIS  Your health care provider will do a physical exam and ask you about your symptoms.  He or she may also order blood tests and urine tests to make sure something else is not causing the problem.  TREATMENT  You may only need medicine to control the problem. If medicines do not control the nausea and vomiting, you will be treated in the hospital to prevent dehydration, increased acid in the blood (acidosis), weight loss, and changes in the electrolytes in your body that may harm the unborn baby (fetus). You may need IV fluids.  HOME CARE INSTRUCTIONS   Only take over-the-counter or prescription medicines as directed by your health care provider.  Try eating a couple of dry crackers or   toast in the morning before getting out of bed.  Avoid foods and smells that upset your stomach.  Avoid fatty and spicy foods.  Eat 5-6 small meals a day.  Do not drink when eating meals. Drink between meals.  For snacks, eat high-protein foods, such as cheese.  Eat or suck on things that have ginger in them. Ginger helps nausea.  Avoid food preparation. The smell of food can spoil your appetite.  Avoid iron pills and iron in your multivitamins until after 3-4 months of being pregnant. However, consult with your health care provider before stopping any prescribed iron pills. SEEK MEDICAL CARE IF:   Your abdominal pain increases.  You have a severe headache.  You have vision problems.  You are losing weight. SEEK IMMEDIATE MEDICAL CARE IF:   You are unable to keep fluids down.  You vomit blood.  You have constant nausea and vomiting.  You have excessive weakness.  You have extreme thirst.  You have dizziness or fainting.  You have a fever or persistent symptoms for more than 2-3 days.  You have a fever and your symptoms suddenly get worse. MAKE SURE YOU:   Understand these instructions.  Will watch your condition.  Will get help right away if you are not doing well or get worse.   This information is not intended to replace advice given to you by your health care  provider. Make sure you discuss any questions you have with your health care provider.   Document Released: 04/28/2005 Document Revised: 02/16/2013 Document Reviewed: 12/08/2012 Elsevier Interactive Patient Education 2016 Elsevier Inc. Commonly Asked Questions During Pregnancy  Cats: A parasite can be excreted in cat feces.  To avoid exposure you need to have another person empty the little box.  If you must empty the litter box you will need to wear gloves.  Wash your hands after handling your cat.  This parasite can also be found in raw or undercooked meat so this should also be avoided.  Colds, Sore Throats, Flu: Please check your medication sheet to see what you can take for symptoms.  If your symptoms are unrelieved by these medications please call the office.  Dental Work: Most any dental work your dentist recommends is permitted.  X-rays should only be taken during the first trimester if absolutely necessary.  Your abdomen should be shielded with a lead apron during all x-rays.  Please notify your provider prior to receiving any x-rays.  Novocaine is fine; gas is not recommended.  If your dentist requires a note from us prior to dental work please call the office and we will provide one for you.  Exercise: Exercise is an important part of staying healthy during your pregnancy.  You may continue most exercises you were accustomed to prior to pregnancy.  Later in your pregnancy you will most likely notice you have difficulty with activities requiring balance like riding a bicycle.  It is important that you listen to your body and avoid activities that put you at a higher risk of falling.  Adequate rest and staying well hydrated are a must!  If you have questions about the safety of specific activities ask your provider.    Exposure to Children with illness: Try to avoid obvious exposure; report any symptoms to us when noted,  If you have chicken pos, red measles or mumps, you should be immune to  these diseases.   Please do not take any vaccines while pregnant unless you have checked with   your OB provider.  Fetal Movement: After 28 weeks we recommend you do "kick counts" twice daily.  Lie or sit down in a calm quiet environment and count your baby movements "kicks".  You should feel your baby at least 10 times per hour.  If you have not felt 10 kicks within the first hour get up, walk around and have something sweet to eat or drink then repeat for an additional hour.  If count remains less than 10 per hour notify your provider.  Fumigating: Follow your pest control agent's advice as to how long to stay out of your home.  Ventilate the area well before re-entering.  Hemorrhoids:   Most over-the-counter preparations can be used during pregnancy.  Check your medication to see what is safe to use.  It is important to use a stool softener or fiber in your diet and to drink lots of liquids.  If hemorrhoids seem to be getting worse please call the office.   Hot Tubs:  Hot tubs Jacuzzis and saunas are not recommended while pregnant.  These increase your internal body temperature and should be avoided.  Intercourse:  Sexual intercourse is safe during pregnancy as long as you are comfortable, unless otherwise advised by your provider.  Spotting may occur after intercourse; report any bright red bleeding that is heavier than spotting.  Labor:  If you know that you are in labor, please go to the hospital.  If you are unsure, please call the office and let us help you decide what to do.  Lifting, straining, etc:  If your job requires heavy lifting or straining please check with your provider for any limitations.  Generally, you should not lift items heavier than that you can lift simply with your hands and arms (no back muscles)  Painting:  Paint fumes do not harm your pregnancy, but may make you ill and should be avoided if possible.  Latex or water based paints have less odor than oils.  Use adequate  ventilation while painting.  Permanents & Hair Color:  Chemicals in hair dyes are not recommended as they cause increase hair dryness which can increase hair loss during pregnancy.  " Highlighting" and permanents are allowed.  Dye may be absorbed differently and permanents may not hold as well during pregnancy.  Sunbathing:  Use a sunscreen, as skin burns easily during pregnancy.  Drink plenty of fluids; avoid over heating.  Tanning Beds:  Because their possible side effects are still unknown, tanning beds are not recommended.  Ultrasound Scans:  Routine ultrasounds are performed at approximately 20 weeks.  You will be able to see your baby's general anatomy an if you would like to know the gender this can usually be determined as well.  If it is questionable when you conceived you may also receive an ultrasound early in your pregnancy for dating purposes.  Otherwise ultrasound exams are not routinely performed unless there is a medical necessity.  Although you can request a scan we ask that you pay for it when conducted because insurance does not cover " patient request" scans.  Work: If your pregnancy proceeds without complications you may work until your due date, unless your physician or employer advises otherwise.  Round Ligament Pain/Pelvic Discomfort:  Sharp, shooting pains not associated with bleeding are fairly common, usually occurring in the second trimester of pregnancy.  They tend to be worse when standing up or when you remain standing for long periods of time.  These are the result   of pressure of certain pelvic ligaments called "round ligaments".  Rest, Tylenol and heat seem to be the most effective relief.  As the womb and fetus grow, they rise out of the pelvis and the discomfort improves.  Please notify the office if your pain seems different than that described.  It may represent a more serious condition.   

## 2015-08-24 LAB — CBC WITH DIFFERENTIAL/PLATELET
Basophils Absolute: 0 10*3/uL (ref 0.0–0.2)
Basos: 0 %
EOS (ABSOLUTE): 0.1 10*3/uL (ref 0.0–0.4)
EOS: 1 %
HEMATOCRIT: 36.1 % (ref 34.0–46.6)
HEMOGLOBIN: 12.6 g/dL (ref 11.1–15.9)
IMMATURE GRANS (ABS): 0 10*3/uL (ref 0.0–0.1)
Immature Granulocytes: 0 %
LYMPHS ABS: 2.6 10*3/uL (ref 0.7–3.1)
Lymphs: 23 %
MCH: 32.7 pg (ref 26.6–33.0)
MCHC: 34.9 g/dL (ref 31.5–35.7)
MCV: 94 fL (ref 79–97)
MONOCYTES: 9 %
Monocytes Absolute: 1 10*3/uL — ABNORMAL HIGH (ref 0.1–0.9)
NEUTROS ABS: 7.9 10*3/uL — AB (ref 1.4–7.0)
Neutrophils: 67 %
Platelets: 331 10*3/uL (ref 150–379)
RBC: 3.85 x10E6/uL (ref 3.77–5.28)
RDW: 11.7 % — ABNORMAL LOW (ref 12.3–15.4)
WBC: 11.7 10*3/uL — ABNORMAL HIGH (ref 3.4–10.8)

## 2015-08-24 LAB — HIV ANTIBODY (ROUTINE TESTING W REFLEX): HIV SCREEN 4TH GENERATION: NONREACTIVE

## 2015-08-24 LAB — RH TYPE: RH TYPE: POSITIVE

## 2015-08-24 LAB — VARICELLA ZOSTER ANTIBODY, IGM

## 2015-08-24 LAB — RUBELLA ANTIBODY, IGM: Rubella IgM: <20 AU/mL (ref 0.0–19.9)

## 2015-08-24 LAB — RPR: RPR Ser Ql: NONREACTIVE

## 2015-08-24 LAB — HEPATITIS B SURFACE ANTIGEN: Hepatitis B Surface Ag: NEGATIVE

## 2015-08-24 LAB — ABO

## 2015-08-24 LAB — ANTIBODY SCREEN: Antibody Screen: NEGATIVE

## 2015-08-25 LAB — URINALYSIS, ROUTINE W REFLEX MICROSCOPIC
Bilirubin, UA: NEGATIVE
GLUCOSE, UA: NEGATIVE
KETONES UA: NEGATIVE
Leukocytes, UA: NEGATIVE
NITRITE UA: NEGATIVE
PH UA: 6.5 (ref 5.0–7.5)
RBC UA: NEGATIVE
Specific Gravity, UA: 1.021 (ref 1.005–1.030)
Urobilinogen, Ur: 0.2 mg/dL (ref 0.2–1.0)

## 2015-08-25 LAB — PAIN MGT SCRN (14 DRUGS), UR
AMPHETAMINE SCRN UR: NEGATIVE ng/mL
Barbiturate Screen, Ur: NEGATIVE ng/mL
Benzodiazepine Screen, Urine: NEGATIVE ng/mL
Buprenorphine, Urine: NEGATIVE ng/mL
Cannabinoids Ur Ql Scn: NEGATIVE ng/mL
Cocaine(Metab.)Screen, Urine: NEGATIVE ng/mL
Creatinine(Crt), U: 130.4 mg/dL (ref 20.0–300.0)
Fentanyl, Urine: NEGATIVE pg/mL
METHADONE SCREEN, URINE: NEGATIVE ng/mL
Meperidine Screen, Urine: NEGATIVE ng/mL
OXYCODONE+OXYMORPHONE UR QL SCN: NEGATIVE ng/mL
Opiate Scrn, Ur: NEGATIVE ng/mL
PCP SCRN UR: NEGATIVE ng/mL
PH UR, DRUG SCRN: 6 (ref 4.5–8.9)
PROPOXYPHENE SCREEN: NEGATIVE ng/mL
TRAMADOL UR QL SCN: NEGATIVE ng/mL

## 2015-08-25 LAB — URINE CULTURE, OB REFLEX

## 2015-08-25 LAB — NICOTINE SCREEN, URINE: Cotinine Ql Scrn, Ur: NEGATIVE ng/mL

## 2015-08-25 LAB — GC/CHLAMYDIA PROBE AMP
CHLAMYDIA, DNA PROBE: NEGATIVE
NEISSERIA GONORRHOEAE BY PCR: NEGATIVE

## 2015-08-25 LAB — CULTURE, OB URINE

## 2015-08-27 ENCOUNTER — Other Ambulatory Visit: Payer: Self-pay | Admitting: Obstetrics and Gynecology

## 2015-08-27 DIAGNOSIS — O09899 Supervision of other high risk pregnancies, unspecified trimester: Secondary | ICD-10-CM

## 2015-08-27 DIAGNOSIS — O9989 Other specified diseases and conditions complicating pregnancy, childbirth and the puerperium: Principal | ICD-10-CM

## 2015-08-27 DIAGNOSIS — Z283 Underimmunization status: Secondary | ICD-10-CM | POA: Insufficient documentation

## 2015-08-30 ENCOUNTER — Encounter: Payer: Self-pay | Admitting: Obstetrics and Gynecology

## 2015-09-12 ENCOUNTER — Ambulatory Visit (INDEPENDENT_AMBULATORY_CARE_PROVIDER_SITE_OTHER): Payer: BC Managed Care – PPO | Admitting: Obstetrics and Gynecology

## 2015-09-12 VITALS — BP 95/62 | HR 99 | Wt 147.3 lb

## 2015-09-12 DIAGNOSIS — O9989 Other specified diseases and conditions complicating pregnancy, childbirth and the puerperium: Secondary | ICD-10-CM

## 2015-09-12 DIAGNOSIS — R110 Nausea: Secondary | ICD-10-CM

## 2015-09-12 DIAGNOSIS — Z3491 Encounter for supervision of normal pregnancy, unspecified, first trimester: Secondary | ICD-10-CM

## 2015-09-12 DIAGNOSIS — O09899 Supervision of other high risk pregnancies, unspecified trimester: Secondary | ICD-10-CM

## 2015-09-12 DIAGNOSIS — Z2839 Other underimmunization status: Secondary | ICD-10-CM

## 2015-09-12 DIAGNOSIS — Z283 Underimmunization status: Secondary | ICD-10-CM

## 2015-09-12 DIAGNOSIS — G43909 Migraine, unspecified, not intractable, without status migrainosus: Secondary | ICD-10-CM

## 2015-09-12 DIAGNOSIS — Z789 Other specified health status: Secondary | ICD-10-CM

## 2015-09-12 LAB — POCT URINALYSIS DIPSTICK
Bilirubin, UA: NEGATIVE
Glucose, UA: NEGATIVE
Ketones, UA: NEGATIVE
NITRITE UA: NEGATIVE
PH UA: 6.5
PROTEIN UA: NEGATIVE
RBC UA: NEGATIVE
Spec Grav, UA: 1.015
UROBILINOGEN UA: 0.2

## 2015-09-12 NOTE — Progress Notes (Signed)
NEW OB HISTORY AND PHYSICAL  SUBJECTIVE:       Claudia Rogers is a 30 y.o. G49P0010 female, Patient's last menstrual period was 06/27/2015 (exact date)., Estimated Date of Delivery: 04/02/16, [redacted]w[redacted]d presents today for establishment of Prenatal Care. She reports that she has experience nausea w/o vomiting and HAs which she's taken tylenol for. She has no unusual complaints and complains of visual changes, abdominal pain, vaginal bleeding or spotting, backache and dysuria.    Gynecologic History Patient's last menstrual period was 06/27/2015 (exact date). . Contraception: none Last Pap: 06/2015. Results were: normal  Obstetric History OB History  Gravida Para Term Preterm AB SAB TAB Ectopic Multiple Living  '2    1 1        ' # Outcome Date GA Lbr Len/2nd Weight Sex Delivery Anes PTL Lv  2 Current           1 SAB 07/20/14        FD      Past Medical History  Diagnosis Date  . History of chicken pox   . H/O viral meningitis   . Anxiety     Past Surgical History  Procedure Laterality Date  . Eye muscle surgery Bilateral 1996  . Eye muscle surgery Bilateral 2000  . Odontoma removal/implant  2003  . Wisdom tooth extraction      Current Outpatient Prescriptions on File Prior to Visit  Medication Sig Dispense Refill  . cetirizine (ZYRTEC) 10 MG tablet Take 10 mg by mouth daily.    . Prenatal Vit-Fe Fumarate-FA (MULTIVITAMIN-PRENATAL) 27-0.8 MG TABS tablet Take 1 tablet by mouth daily at 12 noon.    . progesterone 200 MG SUPP Place 1 suppository (200 mg total) vaginally at bedtime. 30 each 3  . Saccharomyces boulardii (PROBIOTIC) 250 MG CAPS Take 1 capsule by mouth daily.     No current facility-administered medications on file prior to visit.    No Known Allergies  Social History   Social History  . Marital Status: Married    Spouse Name: N/A  . Number of Children: N/A  . Years of Education: N/A   Occupational History  . Not on file.   Social History Main Topics  .  Smoking status: Never Smoker   . Smokeless tobacco: Never Used  . Alcohol Use: 0.0 oz/week    0 Standard drinks or equivalent per week     Comment: Social  . Drug Use: No  . Sexual Activity: Yes    Birth Control/ Protection: None   Other Topics Concern  . Not on file   Social History Narrative    Family History  Problem Relation Age of Onset  . Arthritis Mother   . High blood pressure Mother   . Diabetes Mother     Type II  . Arthritis Maternal Grandmother   . Stroke Maternal Grandmother   . High blood pressure Maternal Grandmother   . Diabetes Maternal Grandmother     Type II  . Alcoholism Maternal Aunt   . Alcoholism Maternal Uncle   . Alcoholism Cousin   . Lung cancer Father   . Lung cancer Paternal Aunt   . High blood pressure Sister   . Kidney disease Maternal Aunt   . Mental illness Sister   . Diabetes Brother     Type II-BKA  . Heart disease Neg Hx   . Cancer Neg Hx     The following portions of the patient's history were reviewed and updated as appropriate:  allergies, current medications, past OB history, past medical history, past surgical history, past family history, past social history, and problem list.    OBJECTIVE: Initial Physical Exam (New OB) BP 95/62 mmHg  Pulse 99  Wt 147 lb 4.8 oz (66.815 kg)  LMP 06/27/2015 (Exact Date) GENERAL APPEARANCE: alert, well appearing, in no apparent distress HEAD: normocephalic, atraumatic MOUTH: Not Examined. THYROID: no thyromegaly or masses present BREASTS: no masses noted, no significant tenderness, no palpable axillary nodes, no skin changes LUNGS: clear to auscultation, no wheezes, rales or rhonchi, symmetric air entry HEART: regular rate and rhythm, no murmurs ABDOMEN: soft, nontender, nondistended, no abnormal masses, no epigastric pain and fundus not palpable EXTREMITIES: no redness or tenderness in the calves or thighs, no edema, no limitation in range of motion SKIN: normal coloration and turgor,  no rashes LYMPH NODES: no adenopathy palpable NEUROLOGIC: alert, oriented, normal speech, no focal findings or movement disorder noted  PELVIC EXAM EXTERNAL GENITALIA: normal appearing vulva with no masses, tenderness or lesions VAGINA: no abnormal discharge or lesions CERVIX: no lesions or cervical motion tenderness and nulliparous and closed UTERUS: gravid, consistent with 10-12 weeks and nontender ADNEXA: no masses palpable and nontender OB EXAM PELVIMETRY: gynecoid  Fetal HR 170 on Doppler US  ASSESSMENT: Normal pregnancy  Risk Factors:  - non-immune status on labs for Rubella and Varicella. Pt states she had MMR vaccines and had the chicken pox. - nausea w/o vomiting - migraine-like HAs  PLAN: Routine Prenatal care New OB counseling: The patient has been given an overview regarding routine prenatal care. Recommendations regarding diet, weight gain, and exercise in pregnancy were given. Prenatal testing, optional genetic testing, and ultrasound use in pregnancy were reviewed.  Benefits of Breast Feeding were discussed. The patient is encouraged to consider nursing her baby post partum.  1. Avoid exposure to Rubella or Varicella. 2. Eat bland foods and stay hydrated for nausea. 3. Tylenol for HA. 4. Discussed healthy eating, staying active, avoidance of alcohol/cigarettes/marijuana smoke/any other illicit drugs. 5. Follow up in 4 weeks.  Claiborne Billings Rayburn PA-S Brayton Mars, MD   I have seen, interviewed, and examined the patient in conjunction with the [facility]A. student and affirm the diagnosis and management plan. Valene Villa A. Brenly Trawick, MD, FACOG   Note: This dictation was prepared with Dragon dictation along with smaller phrase technology. Any transcriptional errors that result from this process are unintentional.

## 2015-09-20 ENCOUNTER — Encounter: Payer: Self-pay | Admitting: Obstetrics and Gynecology

## 2015-10-11 ENCOUNTER — Ambulatory Visit (INDEPENDENT_AMBULATORY_CARE_PROVIDER_SITE_OTHER): Payer: BC Managed Care – PPO | Admitting: Obstetrics and Gynecology

## 2015-10-11 ENCOUNTER — Encounter: Payer: Self-pay | Admitting: Obstetrics and Gynecology

## 2015-10-11 VITALS — BP 102/70 | HR 90 | Wt 150.6 lb

## 2015-10-11 DIAGNOSIS — J019 Acute sinusitis, unspecified: Secondary | ICD-10-CM

## 2015-10-11 DIAGNOSIS — Z3492 Encounter for supervision of normal pregnancy, unspecified, second trimester: Secondary | ICD-10-CM

## 2015-10-11 LAB — POCT URINALYSIS DIPSTICK
BILIRUBIN UA: NEGATIVE
Blood, UA: NEGATIVE
GLUCOSE UA: NEGATIVE
Ketones, UA: NEGATIVE
LEUKOCYTES UA: NEGATIVE
NITRITE UA: NEGATIVE
Protein, UA: NEGATIVE
Spec Grav, UA: 1.01
Urobilinogen, UA: 0.2
pH, UA: 6

## 2015-10-11 MED ORDER — CEFDINIR 300 MG PO CAPS: 300.0000 mg | ORAL_CAPSULE | Freq: Two times a day (BID) | ORAL | Status: DC

## 2015-10-11 NOTE — Patient Instructions (Signed)

## 2015-10-11 NOTE — Progress Notes (Signed)
ROB- pt c/o lots of head congestion, she is blowing out green mucous, x 2 weeks Pt also c/o heart palpations

## 2015-10-11 NOTE — Progress Notes (Signed)
ROB-doing well other than sinus pressure and ear pain with green phlegm- not getting any better with Afrin and sudafed use, denies fever, sinuses boggy andnasal passages red bilaterally- green mucus noted, both tympanic membranes bulging- rx sent in for omnecef bid x 7 days

## 2015-10-15 ENCOUNTER — Encounter: Payer: Self-pay | Admitting: Obstetrics and Gynecology

## 2015-10-22 ENCOUNTER — Other Ambulatory Visit: Payer: Self-pay | Admitting: Obstetrics and Gynecology

## 2015-10-22 DIAGNOSIS — R000 Tachycardia, unspecified: Secondary | ICD-10-CM | POA: Insufficient documentation

## 2015-10-31 ENCOUNTER — Encounter: Payer: Self-pay | Admitting: Cardiology

## 2015-10-31 ENCOUNTER — Ambulatory Visit (INDEPENDENT_AMBULATORY_CARE_PROVIDER_SITE_OTHER): Payer: BC Managed Care – PPO | Admitting: Cardiology

## 2015-10-31 DIAGNOSIS — R002 Palpitations: Secondary | ICD-10-CM

## 2015-10-31 NOTE — Patient Instructions (Addendum)
Medication Instructions:  Your physician recommends that you continue on your current medications as directed. Please refer to the Current Medication list given to you today.  Labwork: Labs today TSH and T4. We will call you with results.   Testing/Procedures: Your physician has recommended that you wear a holter monitor. Holter monitors are medical devices that record the heart's electrical activity. Doctors most often use these monitors to diagnose arrhythmias. Arrhythmias are problems with the speed or rhythm of the heartbeat. The monitor is a small, portable device. You can wear one while you do your normal daily activities. This is usually used to diagnose what is causing palpitations/syncope (passing out).  Follow-Up: Your physician recommends that you schedule a follow-up appointment as needed.  We will call you with results and if needed schedule a follow up at that time.  It was a pleasure seeing you today here in the office. Please do not hesitate to give us a call back if you have any further questions. [phone]  [place] CellarPamela A. RN, BSN      Any Other Special Instructions Will Be Listed Below (If Applicable).     If you need a refill on your cardiac medications before your next appointment, please call your pharmacy.  Holter Monitoring A Holter monitor is a small device that is used to detect abnormal heart rhythms. It clips to your clothing and is connected by wires to flat, sticky disks (electrodes) that attach to your chest. It is worn continuously for 24-48 hours. HOME CARE INSTRUCTIONS  Wear your Holter monitor at all times, even while exercising and sleeping, for as long as directed by your health care provider.  Make sure that the Holter monitor is safely clipped to your clothing or close to your body as recommended by your health care provider.  Do not get the monitor or wires wet.  Do not put body lotion or moisturizer on your chest.  Keep your skin  clean.  Keep a diary of your daily activities, such as walking and doing chores. If you feel that your heartbeat is abnormal or that your heart is fluttering or skipping a beat:  Record what you are doing when it happens.  Record what time of day the symptoms occur.  Return your Holter monitor as directed by your health care provider.  Keep all follow-up visits as directed by your health care provider. This is important. SEEK IMMEDIATE MEDICAL CARE IF:  You feel lightheaded or you faint.  You have trouble breathing.  You feel pain in your chest, upper arm, or jaw.  You feel sick to your stomach and your skin is pale, cool, or damp.  You heartbeat feels unusual or abnormal.   This information is not intended to replace advice given to you by your health care provider. Make sure you discuss any questions you have with your health care provider.   Document Released: 01/25/2004 Document Revised: 05/19/2014 Document Reviewed: 12/05/2013 Elsevier Interactive Patient Education [employer]2016 Elsevier Inc.

## 2015-10-31 NOTE — Progress Notes (Signed)
Cardiology Office Note   Date:  10/31/2015   ID:  Claudia Rogers, DOB 06/13/1985, MRN [id number]  Referring Doctor:  Roxy MannsMarne Tower, MD   Cardiologist:   Almond LintAileen Kawena Lyday, MD   Reason for consultation:  Chief Complaint  Patient presents with  . other    Ref by Harlow MaresMelody Shambley with [facility] for tachycardia. Meds reviewed by the patient verbally. Pt. is [redacted] weeks pregnant and is c/o rapid heart beats.       History of Present Illness: Claudia Rogers is a 30 y.o. female who presents for Palpitations.  Patient is currently 7018 weeks age of gestation. Beginning 14 weeks, she noted palpitations or feeling her heart racing. Symptoms are mainly in the chest, nonradiating. Moderate in intensity, occasionally becoming intense. More noticeable when she is resting, or when she is in bed. Symptoms lasted minutes at a time, sometimes associated with shortness of breath, sometimes associated with dizziness. No prior episode of loss of consciousness. No chest pain. Patient does not have any symptoms during exertion or walking or exercising.  Patient denies PND, orthopnea, edema. No fever, cough, colds, abdominal pain.  ROS:  Please see the history of present illness. Aside from mentioned under HPI, all other systems are reviewed and negative.     Past Medical History  Diagnosis Date  . History of chicken pox   . H/O viral meningitis   . Anxiety     Past Surgical History  Procedure Laterality Date  . Eye muscle surgery Bilateral 1996  . Eye muscle surgery Bilateral 2000  . Odontoma removal/implant  2003  . Wisdom tooth extraction       reports that she has never smoked. She has never used smokeless tobacco. She reports that she drinks alcohol. She reports that she does not use illicit drugs.   family history includes Alcoholism in her cousin, maternal aunt, and maternal uncle; Arthritis in her maternal grandmother and mother; Diabetes in her brother, maternal grandmother, and  mother; High blood pressure in her maternal grandmother, mother, and sister; Kidney disease in her maternal aunt; Lung cancer in her father and paternal aunt; Mental illness in her sister; Stroke in her maternal grandmother. There is no history of Heart disease or Cancer.   Outpatient Prescriptions Prior to Visit  Medication Sig Dispense Refill  . cetirizine (ZYRTEC) 10 MG tablet Take 10 mg by mouth daily.    . Prenatal Vit-Fe Fumarate-FA (MULTIVITAMIN-PRENATAL) 27-0.8 MG TABS tablet Take 1 tablet by mouth daily at 12 noon.    . Saccharomyces boulardii (PROBIOTIC) 250 MG CAPS Take 1 capsule by mouth daily.    . cefdinir (OMNICEF) 300 MG capsule Take 1 capsule (300 mg total) by mouth 2 (two) times daily. (Patient not taking: Reported on 10/31/2015) 14 capsule 1  . progesterone 200 MG SUPP Place 1 suppository (200 mg total) vaginally at bedtime. (Patient not taking: Reported on 10/31/2015) 30 each 3   No facility-administered medications prior to visit.     Allergies: Review of patient's allergies indicates no known allergies.    PHYSICAL EXAM: VS:  BP 136/58 mmHg  Pulse 90  Ht 5\' 4"  (1.626 m)  Wt 157 lb 8 oz (71.442 kg)  BMI 27.02 kg/m2  LMP 06/27/2015 (Exact Date) , Body mass index is 27.02 kg/(m^2). Wt Readings from Last 3 Encounters:  10/31/15 157 lb 8 oz (71.442 kg)  10/11/15 150 lb 9.6 oz (68.312 kg)  09/12/15 147 lb 4.8 oz (66.815 kg)  GENERAL:  well developed, well nourished, not in acute distress HEENT: normocephalic, pink conjunctivae, anicteric sclerae, no xanthelasma, normal dentition, oropharynx clear NECK:  no neck vein engorgement, JVP normal, no hepatojugular reflux, carotid upstroke brisk and symmetric, no bruit, no thyromegaly, no lymphadenopathy LUNGS:  good respiratory effort, clear to auscultation bilaterally CV:  PMI not displaced, no thrills, no lifts, S1 and S2 within normal limits, no palpable S3 or S4, Soft systolic murmur, likely flow murmur, no rubs, no  gallops ABD:  Soft, patient is pregnant, normoactive bowel sounds, no abdominal aortic bruit, no hepatomegaly, no splenomegaly MS: nontender back, no kyphosis, no scoliosis, no joint deformities EXT:  2+ DP/PT pulses, no edema, no varicosities, no cyanosis, no clubbing SKIN: warm, nondiaphoretic, normal turgor, no ulcers NEUROPSYCH: alert, oriented to person, place, and time, sensory/motor grossly intact, normal mood, appropriate affect  Recent Labs: 08/23/2015: Platelets 331   Lipid Panel No results found for: CHOL, TRIG, HDL, CHOLHDL, VLDL, LDLCALC, LDLDIRECT   Other studies Reviewed:  EKG:  The ekg from 10/31/2015 was personally reviewed by me and it revealed sinus rhythm, sinus arrhythmia. Ventricular rate 90 BPM. Otherwise normal EKG.  Additional studies/ records that were reviewed personally reviewed by me today include: None available   ASSESSMENT AND PLAN:  Palpitations We had a discussion about the expected hormonal and physiologic changes during the second trimester. Recommend Holter monitor and echocardiogram to rule out significant arrhythmia, and evaluate ejection fraction. Check TSH and free T4. Recommend to improve hydration/increase fluid intake. Recommend taking caution with changing positions. Recommend possible benefit from using compression stockings as well.  Current medicines are reviewed at length with the patient today.  The patient does not have concerns regarding medicines.  Labs/ tests ordered today include:  Orders Placed This Encounter  Procedures  . EKG 12-Lead    I had a lengthy and detailed discussion with the patient regarding diagnoses, prognosis, diagnostic options, treatment options.   I counseled the patient on importance of lifestyle modification including heart healthy diet, regular physical activity .   Disposition:   FU with undersigned after tests When necessary  Signed, Almond Lint, MD  10/31/2015 2:42 PM    [facility]

## 2015-11-01 LAB — T4, FREE: Free T4: 0.95 ng/dL (ref 0.82–1.77)

## 2015-11-01 LAB — TSH: TSH: 1.74 u[IU]/mL (ref 0.450–4.500)

## 2015-11-08 ENCOUNTER — Ambulatory Visit (INDEPENDENT_AMBULATORY_CARE_PROVIDER_SITE_OTHER): Payer: BC Managed Care – PPO

## 2015-11-08 DIAGNOSIS — R002 Palpitations: Secondary | ICD-10-CM | POA: Diagnosis not present

## 2015-11-09 ENCOUNTER — Other Ambulatory Visit: Payer: Self-pay

## 2015-11-09 ENCOUNTER — Ambulatory Visit (INDEPENDENT_AMBULATORY_CARE_PROVIDER_SITE_OTHER): Payer: BC Managed Care – PPO

## 2015-11-09 DIAGNOSIS — R002 Palpitations: Secondary | ICD-10-CM | POA: Diagnosis not present

## 2015-11-10 ENCOUNTER — Other Ambulatory Visit: Payer: Self-pay | Admitting: Obstetrics and Gynecology

## 2015-11-10 DIAGNOSIS — Z3492 Encounter for supervision of normal pregnancy, unspecified, second trimester: Secondary | ICD-10-CM

## 2015-11-10 LAB — ECHOCARDIOGRAM COMPLETE
AVLVOTPG: 6 mmHg
Ao-asc: 24 cm
CHL CUP DOP CALC LVOT VTI: 26.6 cm
CHL CUP MV DEC (S): 246
CHL CUP STROKE VOLUME: 48 mL
E decel time: 246 msec
EERAT: 6.01
FS: 32 % (ref 28–44)
IV/PV OW: 1.03
LA ID, A-P, ES: 33 mm
LA diam index: 1.82 cm/m2
LA vol A4C: 33.9 ml
LAVOL: 43.7 mL
LAVOLIN: 24.1 mL/m2
LDCA: 2.54 cm2
LEFT ATRIUM END SYS DIAM: 33 mm
LV SIMPSON'S DISK: 55
LV dias vol: 86 mL (ref 46–106)
LV sys vol index: 21 mL/m2
LVDIAVOLIN: 48 mL/m2
LVEEAVG: 6.01
LVEEMED: 6.01
LVELAT: 17.3 cm/s
LVOT diameter: 18 mm
LVOTPV: 124 cm/s
LVOTSV: 68 mL
LVSYSVOL: 39 mL (ref 14–42)
MV pk A vel: 50 m/s
MV pk E vel: 104 m/s
MVPG: 4 mmHg
PW: 8.41 mm — AB (ref 0.6–1.1)
RV TAPSE: 23 mm
TDI e' lateral: 17.3
TDI e' medial: 13.5

## 2015-11-15 ENCOUNTER — Ambulatory Visit (INDEPENDENT_AMBULATORY_CARE_PROVIDER_SITE_OTHER): Payer: BC Managed Care – PPO | Admitting: Obstetrics and Gynecology

## 2015-11-15 ENCOUNTER — Encounter: Payer: Self-pay | Admitting: Obstetrics and Gynecology

## 2015-11-15 ENCOUNTER — Ambulatory Visit (INDEPENDENT_AMBULATORY_CARE_PROVIDER_SITE_OTHER): Payer: BC Managed Care – PPO

## 2015-11-15 VITALS — BP 109/65 | HR 76 | Wt 156.1 lb

## 2015-11-15 DIAGNOSIS — Z3492 Encounter for supervision of normal pregnancy, unspecified, second trimester: Secondary | ICD-10-CM

## 2015-11-15 LAB — POCT URINALYSIS DIPSTICK
BILIRUBIN UA: NEGATIVE
Blood, UA: NEGATIVE
GLUCOSE UA: NEGATIVE
KETONES UA: NEGATIVE
Leukocytes, UA: NEGATIVE
Nitrite, UA: NEGATIVE
PROTEIN UA: NEGATIVE
UROBILINOGEN UA: 0.2
pH, UA: 6

## 2015-11-15 NOTE — Progress Notes (Signed)
ROB- anatomy scan done today, pt is doing well, some hip and back pain

## 2015-11-15 NOTE — Progress Notes (Signed)
Indications:Anatomy U/S Findings:  Singleton intrauterine pregnancy is visualized with FHR at 139 BPM. Biometrics give an (U/S) Gestational age of 29 2/7 weeks and an (U/S) EDD of 04/01/16; this correlates with the clinically established EDD of 04/02/16.  Fetal presentation is Breech.  EFW: 360g (13oz). Placenta: Anterior, grade 0, remote to cervix. AFI: Adequate with MVP of 4.5cm.  Anatomic survey is complete and normal; Gender - female  .   Right Ovary measures 3.2 x 1.7 x 3 cm. It is normal in appearance. Left Ovary measures 3 x 1.6 x 2.1 cm. It is normal appearance. There is evidence of a corpus luteal cyst in the Right ovary. Survey of the adnexa demonstrates no adnexal masses. There is no free peritoneal fluid in the cul de sac.  Impression: 1. 20 2/7 week Viable Singleton Intrauterine pregnancy by U/S. 2. (U/S) EDD is consistent with Clinically established (LMP) EDD of 04/02/16. 3. Normal Anatomy Scan

## 2015-11-16 ENCOUNTER — Ambulatory Visit
Admission: RE | Admit: 2015-11-16 | Discharge: 2015-11-16 | Disposition: A | Discharge status: Home / Self Care | Payer: BC Managed Care – PPO | Source: Ambulatory Visit | Attending: Cardiology | Admitting: Cardiology

## 2015-11-16 DIAGNOSIS — R002 Palpitations: Secondary | ICD-10-CM | POA: Insufficient documentation

## 2015-12-20 ENCOUNTER — Ambulatory Visit (INDEPENDENT_AMBULATORY_CARE_PROVIDER_SITE_OTHER): Payer: BC Managed Care – PPO | Admitting: Obstetrics and Gynecology

## 2015-12-20 VITALS — BP 103/61 | HR 78 | Wt 165.6 lb

## 2015-12-20 DIAGNOSIS — J020 Streptococcal pharyngitis: Secondary | ICD-10-CM

## 2015-12-20 DIAGNOSIS — Z3491 Encounter for supervision of normal pregnancy, unspecified, first trimester: Secondary | ICD-10-CM

## 2015-12-20 DIAGNOSIS — J029 Acute pharyngitis, unspecified: Secondary | ICD-10-CM

## 2015-12-20 LAB — POCT URINALYSIS DIPSTICK
Bilirubin, UA: NEGATIVE
Blood, UA: NEGATIVE
Glucose, UA: NEGATIVE
KETONES UA: NEGATIVE
Leukocytes, UA: NEGATIVE
Nitrite, UA: NEGATIVE
PH UA: 6.5
PROTEIN UA: NEGATIVE
Urobilinogen, UA: 0.2

## 2015-12-20 MED ORDER — AMOXICILLIN 500 MG PO CAPS: 500.0000 mg | ORAL_CAPSULE | Freq: Three times a day (TID) | ORAL | 2 refills | Status: DC

## 2015-12-20 NOTE — Progress Notes (Signed)
ROB- pt is c/o sinus drainage, sore throat, felt feverish

## 2015-12-20 NOTE — Progress Notes (Signed)
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 Rapid strep test  Discussed enrolling in CBC, ICC, & BFC, plans circumcision

## 2015-12-21 ENCOUNTER — Encounter: Payer: Self-pay | Admitting: Obstetrics and Gynecology

## 2015-12-31 ENCOUNTER — Telehealth: Payer: Self-pay | Admitting: Cardiology

## 2015-12-31 NOTE — Telephone Encounter (Signed)
Received records request from [facility], forwarded to [employer] for processing  °

## 2016-01-03 ENCOUNTER — Telehealth: Payer: Self-pay | Admitting: Cardiology

## 2016-01-03 NOTE — Telephone Encounter (Signed)
Received records request from [facility], forwarded to [employer] for processing  °

## 2016-01-07 ENCOUNTER — Telehealth: Payer: Self-pay | Admitting: Obstetrics and Gynecology

## 2016-01-07 NOTE — Telephone Encounter (Signed)
PT CALLED AND SHE STATED THAT SHE IS HAVING SOME DIGESTIVE ISSUES AND WOULD LIKE A CALL BACK FROM YOU

## 2016-01-08 ENCOUNTER — Other Ambulatory Visit: Payer: Self-pay | Admitting: *Deleted

## 2016-01-08 DIAGNOSIS — Z131 Encounter for screening for diabetes mellitus: Secondary | ICD-10-CM

## 2016-01-08 NOTE — Telephone Encounter (Signed)
Pt called states she has had loose stools since 01/03/16, denies any cramping, just getting a little concerned Per pt no one has had stomach virus

## 2016-01-08 NOTE — Telephone Encounter (Signed)
Called pt.

## 2016-01-08 NOTE — Telephone Encounter (Signed)
Have her take some OTC immodium

## 2016-01-10 ENCOUNTER — Other Ambulatory Visit: Payer: BC Managed Care – PPO

## 2016-01-10 ENCOUNTER — Ambulatory Visit (INDEPENDENT_AMBULATORY_CARE_PROVIDER_SITE_OTHER): Payer: BC Managed Care – PPO | Admitting: Obstetrics and Gynecology

## 2016-01-10 VITALS — BP 116/56 | HR 82 | Wt 167.4 lb

## 2016-01-10 DIAGNOSIS — Z23 Encounter for immunization: Secondary | ICD-10-CM

## 2016-01-10 DIAGNOSIS — Z131 Encounter for screening for diabetes mellitus: Secondary | ICD-10-CM

## 2016-01-10 DIAGNOSIS — Z3493 Encounter for supervision of normal pregnancy, unspecified, third trimester: Secondary | ICD-10-CM

## 2016-01-10 LAB — POCT URINALYSIS DIPSTICK
Bilirubin, UA: NEGATIVE
Glucose, UA: NEGATIVE
Ketones, UA: NEGATIVE
Leukocytes, UA: NEGATIVE
Nitrite, UA: NEGATIVE
PROTEIN UA: NEGATIVE
RBC UA: NEGATIVE
SPEC GRAV UA: 1.01
UROBILINOGEN UA: 0.2
pH, UA: 6.5

## 2016-01-10 MED ORDER — TETANUS-DIPHTH-ACELL PERTUSSIS 5-2.5-18.5 LF-MCG/0.5 IM SUSP
0.5000 mL | Freq: Once | INTRAMUSCULAR | Status: AC
  Administered 2016-01-10: 0.5 mL via INTRAMUSCULAR

## 2016-01-10 NOTE — Progress Notes (Signed)
ROB- glucola done, blood consent signed, tdap given Pt is doing well, having lots of acid reflux

## 2016-01-10 NOTE — Progress Notes (Signed)
ROB-doing well, glucola done, signed up for classes. Discussed cord blood options.

## 2016-01-11 LAB — GLUCOSE, 1 HOUR GESTATIONAL: GESTATIONAL DIABETES SCREEN: 103 mg/dL (ref 65–139)

## 2016-01-11 LAB — HEMOGLOBIN AND HEMATOCRIT, BLOOD
Hematocrit: 33.5 % — ABNORMAL LOW (ref 34.0–46.6)
Hemoglobin: 11.4 g/dL (ref 11.1–15.9)

## 2016-01-23 ENCOUNTER — Ambulatory Visit (INDEPENDENT_AMBULATORY_CARE_PROVIDER_SITE_OTHER): Payer: BC Managed Care – PPO | Admitting: Obstetrics and Gynecology

## 2016-01-23 VITALS — BP 116/58 | HR 75 | Wt 171.1 lb

## 2016-01-23 DIAGNOSIS — Z3493 Encounter for supervision of normal pregnancy, unspecified, third trimester: Secondary | ICD-10-CM

## 2016-01-23 DIAGNOSIS — Z23 Encounter for immunization: Secondary | ICD-10-CM | POA: Diagnosis not present

## 2016-01-23 LAB — POCT URINALYSIS DIPSTICK
BILIRUBIN UA: NEGATIVE
Glucose, UA: NEGATIVE
KETONES UA: NEGATIVE
LEUKOCYTES UA: NEGATIVE
NITRITE UA: NEGATIVE
PH UA: 7
PROTEIN UA: NEGATIVE
RBC UA: NEGATIVE
Spec Grav, UA: 1.015
Urobilinogen, UA: 0.2

## 2016-01-23 NOTE — Progress Notes (Signed)
ROB-having reflux, zantac helps, also needs to elevate HOB- recommend witch hazel compresses and V2 supporter. Flu vaccine given.

## 2016-01-23 NOTE — Progress Notes (Signed)
ROB- pt is having some varicose veins bulging, having some pelvic pressure, husband states she sounds like she is chocking, she is snoring

## 2016-02-05 ENCOUNTER — Telehealth: Payer: Self-pay | Admitting: Cardiology

## 2016-02-05 NOTE — Telephone Encounter (Signed)
Received records request [facility] , forwarded to [employer] for processing ° °

## 2016-02-06 ENCOUNTER — Ambulatory Visit (INDEPENDENT_AMBULATORY_CARE_PROVIDER_SITE_OTHER): Payer: BC Managed Care – PPO | Admitting: Obstetrics and Gynecology

## 2016-02-06 VITALS — BP 113/67 | HR 99 | Wt 172.2 lb

## 2016-02-06 DIAGNOSIS — Z3493 Encounter for supervision of normal pregnancy, unspecified, third trimester: Secondary | ICD-10-CM

## 2016-02-06 LAB — POCT URINALYSIS DIPSTICK
BILIRUBIN UA: NEGATIVE
GLUCOSE UA: NEGATIVE
Ketones, UA: NEGATIVE
Leukocytes, UA: NEGATIVE
NITRITE UA: NEGATIVE
Protein, UA: NEGATIVE
RBC UA: NEGATIVE
SPEC GRAV UA: 1.01
Urobilinogen, UA: 0.2
pH, UA: 6.5

## 2016-02-06 MED ORDER — RANITIDINE HCL 150 MG PO TABS: 150.0000 mg | ORAL_TABLET | Freq: Two times a day (BID) | ORAL | 2 refills | Status: DC

## 2016-02-06 NOTE — Progress Notes (Signed)
ROB-is wearing V2 supporter, it is helping. [facility].

## 2016-02-06 NOTE — Progress Notes (Signed)
ROB- pt is doing well, zantac is helping with heart burn

## 2016-02-20 ENCOUNTER — Ambulatory Visit (INDEPENDENT_AMBULATORY_CARE_PROVIDER_SITE_OTHER): Payer: BC Managed Care – PPO | Admitting: Obstetrics and Gynecology

## 2016-02-20 VITALS — BP 115/64 | HR 81 | Wt 175.6 lb

## 2016-02-20 DIAGNOSIS — Z3493 Encounter for supervision of normal pregnancy, unspecified, third trimester: Secondary | ICD-10-CM

## 2016-02-20 LAB — POCT URINALYSIS DIPSTICK
BILIRUBIN UA: NEGATIVE
Blood, UA: NEGATIVE
GLUCOSE UA: NEGATIVE
KETONES UA: NEGATIVE
Leukocytes, UA: NEGATIVE
Nitrite, UA: NEGATIVE
Protein, UA: NEGATIVE
SPEC GRAV UA: 1.01
Urobilinogen, UA: 0.2
pH, UA: 6

## 2016-02-20 NOTE — Progress Notes (Signed)
ROB- pt states @ work she walk about 5 miles a day, varicose veins getting worse

## 2016-02-20 NOTE — Progress Notes (Signed)
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 Tylenol,and epsom salt soaks OK. Plan to stop work one week early.

## 2016-03-05 ENCOUNTER — Ambulatory Visit (INDEPENDENT_AMBULATORY_CARE_PROVIDER_SITE_OTHER): Payer: BC Managed Care – PPO | Admitting: Obstetrics and Gynecology

## 2016-03-05 VITALS — BP 99/61 | HR 93 | Wt 176.2 lb

## 2016-03-05 DIAGNOSIS — Z3483 Encounter for supervision of other normal pregnancy, third trimester: Secondary | ICD-10-CM

## 2016-03-05 DIAGNOSIS — Z113 Encounter for screening for infections with a predominantly sexual mode of transmission: Secondary | ICD-10-CM

## 2016-03-05 DIAGNOSIS — Z3685 Encounter for antenatal screening for Streptococcus B: Secondary | ICD-10-CM

## 2016-03-05 DIAGNOSIS — R102 Pelvic and perineal pain: Secondary | ICD-10-CM

## 2016-03-05 LAB — POCT URINALYSIS DIPSTICK
BILIRUBIN UA: NEGATIVE
GLUCOSE UA: NEGATIVE
KETONES UA: NEGATIVE
NITRITE UA: NEGATIVE
Protein, UA: NEGATIVE
RBC UA: NEGATIVE
SPEC GRAV UA: 1.015
Urobilinogen, UA: NEGATIVE
pH, UA: 6.5

## 2016-03-05 LAB — OB RESULTS CONSOLE GBS: GBS: POSITIVE

## 2016-03-09 LAB — GC/CHLAMYDIA PROBE AMP
Chlamydia trachomatis, NAA: NEGATIVE
Neisseria gonorrhoeae by PCR: NEGATIVE

## 2016-03-10 ENCOUNTER — Encounter: Payer: Self-pay | Admitting: Obstetrics and Gynecology

## 2016-03-10 DIAGNOSIS — B951 Streptococcus, group B, as the cause of diseases classified elsewhere: Secondary | ICD-10-CM | POA: Insufficient documentation

## 2016-03-10 LAB — CULTURE, BETA STREP (GROUP B ONLY): STREP GP B CULTURE: POSITIVE — AB

## 2016-03-10 NOTE — Progress Notes (Signed)
ROB: Patient reports had episode of feeling dizzy and faint over the weekend.  Also notes pelvic veins very painful.  Discussed use of cold compresses to the area, and reassured of resolution after delivery. 36 week labs done today. RTC in 1 week.

## 2016-03-12 ENCOUNTER — Ambulatory Visit (INDEPENDENT_AMBULATORY_CARE_PROVIDER_SITE_OTHER): Payer: BC Managed Care – PPO | Admitting: Obstetrics and Gynecology

## 2016-03-12 VITALS — BP 115/65 | HR 81 | Wt 177.3 lb

## 2016-03-12 DIAGNOSIS — Z3493 Encounter for supervision of normal pregnancy, unspecified, third trimester: Secondary | ICD-10-CM

## 2016-03-12 LAB — POCT URINALYSIS DIPSTICK
BILIRUBIN UA: NEGATIVE
Blood, UA: NEGATIVE
Glucose, UA: NEGATIVE
KETONES UA: 5
LEUKOCYTES UA: NEGATIVE
NITRITE UA: NEGATIVE
PH UA: 6.5
PROTEIN UA: NEGATIVE
Spec Grav, UA: 1.01
Urobilinogen, UA: 0.2

## 2016-03-12 NOTE — Progress Notes (Signed)
ROB- pt is having some pelvic pressure, otherwise she is doing great

## 2016-03-12 NOTE — Progress Notes (Signed)
ROB- discussed GBS +, labor precautions discussed

## 2016-03-12 NOTE — Patient Instructions (Signed)
Group B streptococcus (GBS) is a type of bacteria often found in healthy women. GBS is not the same as the bacteria that causes strep throat. You may have GBS in your vagina, rectum, or bladder. GBS does not spread through sexual contact, but it can be passed to a baby during childbirth. This can be dangerous for your baby. It is not dangerous to you and usually does not cause any symptoms. Your health care provider may test you for GBS when your pregnancy is between 35 and 37 weeks. GBS is dangerous only during birth, so there is no need to test for it earlier. It is possible to have GBS during pregnancy and never pass it to your baby. If your test results are positive for GBS, your health care provider may recommend giving you antibiotic medicine during delivery to make sure your baby stays healthy. RISK FACTORS You are more likely to pass GBS to your baby if:   Your water breaks (ruptured membrane) or you go into labor before 37 weeks.  Your water breaks 18 hours before you deliver.  You passed GBS during a previous pregnancy.  You have a urinary tract infection caused by GBS any time during pregnancy.  You have a fever during labor. SYMPTOMS Most women who have GBS do not have any symptoms. If you have a urinary tract infection caused by GBS, you might have frequent or painful urination and fever. Babies who get GBS usually show symptoms within 7 days of birth. Symptoms may include:   Breathing problems.  Heart and blood pressure problems.  Digestive and kidney problems. DIAGNOSIS Routine screening for GBS is recommended for all pregnant women. A health care provider takes a sample of the fluid in your vagina and rectum with a swab. It is then sent to a lab to be checked for GBS. A sample of your urine may also be checked for the bacteria.  TREATMENT If you test positive for GBS, you may need treatment with an antibiotic medicine during labor. As soon as you go into labor, or as soon as  your membranes rupture, you will get the antibiotic medicine through an IV access. You will continue to get the medicine until after you give birth. You do not need antibiotic medicine if you are having a cesarean delivery.If your baby shows signs or symptoms of GBS after birth, your baby can also be treated with an antibiotic medicine. HOME CARE INSTRUCTIONS   Take all antibiotic medicine as prescribed by your health care provider. Only take medicine as directed.   Continue with prenatal visits and care.   Keep all follow-up appointments.  SEEK MEDICAL CARE IF:   You have pain when you urinate.   You have to urinate frequently.   You have a fever.  SEEK IMMEDIATE MEDICAL CARE IF:   Your membranes rupture.  You go into labor.   This information is not intended to replace advice given to you by your health care provider. Make sure you discuss any questions you have with your health care provider.   Document Released: 08/05/2007 Document Revised: 05/03/2013 Document Reviewed: 02/18/2013 Elsevier Interactive Patient Education 2016 Elsevier Inc.  

## 2016-03-13 ENCOUNTER — Inpatient Hospital Stay
Admission: EM | Admit: 2016-03-13 | Discharge: 2016-03-13 | Disposition: A | Discharge status: Home / Self Care | Payer: BC Managed Care – PPO | Attending: Obstetrics and Gynecology | Admitting: Obstetrics and Gynecology

## 2016-03-13 ENCOUNTER — Telehealth: Payer: Self-pay | Admitting: Obstetrics and Gynecology

## 2016-03-13 DIAGNOSIS — Z2839 Other underimmunization status: Secondary | ICD-10-CM

## 2016-03-13 DIAGNOSIS — O09899 Supervision of other high risk pregnancies, unspecified trimester: Secondary | ICD-10-CM

## 2016-03-13 DIAGNOSIS — Z3A40 40 weeks gestation of pregnancy: Secondary | ICD-10-CM | POA: Diagnosis not present

## 2016-03-13 DIAGNOSIS — O9989 Other specified diseases and conditions complicating pregnancy, childbirth and the puerperium: Secondary | ICD-10-CM

## 2016-03-13 DIAGNOSIS — Z283 Underimmunization status: Secondary | ICD-10-CM

## 2016-03-13 DIAGNOSIS — Z3483 Encounter for supervision of other normal pregnancy, third trimester: Secondary | ICD-10-CM | POA: Diagnosis present

## 2016-03-13 DIAGNOSIS — Z0289 Encounter for other administrative examinations: Secondary | ICD-10-CM

## 2016-03-13 NOTE — OB Triage Note (Signed)
Patient came in for observation for possible SROM. Patient denies uterine contractions, but having mild abdominal cramping. Patient reports leaking of leaking of fluid this morning at 0600 while brushing her teeth. Patient denies vaginal bleeding and spotting. Vital signs stable and patient afebrile. FHR baseline 135 with moderate variability with accelerations 15 x 15 and no decelerations. Family at bedside. Will continue to monitor.

## 2016-03-13 NOTE — Discharge Summary (Signed)
Patient discharged with instructions on follow up appointment, labor precautions, labor precautions, and when to seek medical attention. Patient ambulatory at discharge with steady gait

## 2016-03-13 NOTE — Telephone Encounter (Signed)
PT CALLED AND SHE WAS CHECKED YESTERDAY AND SHE WAS HAVING SOME CRAMPING AND SOME SPOTTING, AND SHE WASN'T SURE OF THAT WAS NORMAL PLUS SHE ISN'T SURE IF SHE COULD BE LEAKING FLUID.

## 2016-03-13 NOTE — Telephone Encounter (Signed)
Called pt she is going to monitor and knows if soaks a pad to present to ED, and also she understands we are in the office on 03/14/16

## 2016-03-19 ENCOUNTER — Ambulatory Visit (INDEPENDENT_AMBULATORY_CARE_PROVIDER_SITE_OTHER): Payer: BC Managed Care – PPO | Admitting: Obstetrics and Gynecology

## 2016-03-19 VITALS — BP 130/68 | HR 85 | Wt 179.4 lb

## 2016-03-19 DIAGNOSIS — Z3493 Encounter for supervision of normal pregnancy, unspecified, third trimester: Secondary | ICD-10-CM

## 2016-03-19 LAB — POCT URINALYSIS DIPSTICK
BILIRUBIN UA: NEGATIVE
Blood, UA: NEGATIVE
GLUCOSE UA: NEGATIVE
KETONES UA: NEGATIVE
LEUKOCYTES UA: NEGATIVE
Nitrite, UA: NEGATIVE
PH UA: 6
Protein, UA: NEGATIVE
Spec Grav, UA: 1.01
Urobilinogen, UA: 0.2

## 2016-03-19 NOTE — Progress Notes (Signed)
ROB-doing well, no changes.  

## 2016-03-19 NOTE — Progress Notes (Signed)
ROB- pt is having a lot of pelvic pressure, increased d/c

## 2016-03-24 ENCOUNTER — Telehealth: Payer: Self-pay | Admitting: Obstetrics and Gynecology

## 2016-03-24 NOTE — Telephone Encounter (Signed)
Her FMLA needs a separate letter stating that Claudia Rogers is taking her out of work early starting 11/13  Fax # [phone]

## 2016-03-26 ENCOUNTER — Ambulatory Visit (INDEPENDENT_AMBULATORY_CARE_PROVIDER_SITE_OTHER): Payer: BC Managed Care – PPO | Admitting: Obstetrics and Gynecology

## 2016-03-26 VITALS — BP 128/78 | HR 88 | Wt 180.5 lb

## 2016-03-26 DIAGNOSIS — Z3A40 40 weeks gestation of pregnancy: Secondary | ICD-10-CM

## 2016-03-26 LAB — POCT URINALYSIS DIPSTICK
BILIRUBIN UA: NEGATIVE
Blood, UA: NEGATIVE
GLUCOSE UA: NEGATIVE
Ketones, UA: NEGATIVE
Leukocytes, UA: NEGATIVE
NITRITE UA: NEGATIVE
Protein, UA: NEGATIVE
Spec Grav, UA: 1.015
UROBILINOGEN UA: 0.2
pH, UA: 7

## 2016-03-26 NOTE — Telephone Encounter (Signed)
Faxed letter

## 2016-03-26 NOTE — Progress Notes (Signed)
ROB- doing well, discussed postdates care.     

## 2016-04-02 ENCOUNTER — Ambulatory Visit (INDEPENDENT_AMBULATORY_CARE_PROVIDER_SITE_OTHER): Payer: BC Managed Care – PPO | Admitting: Obstetrics and Gynecology

## 2016-04-02 ENCOUNTER — Ambulatory Visit (INDEPENDENT_AMBULATORY_CARE_PROVIDER_SITE_OTHER): Payer: BC Managed Care – PPO

## 2016-04-02 VITALS — BP 114/80 | HR 94 | Wt 181.8 lb

## 2016-04-02 DIAGNOSIS — Z3A40 40 weeks gestation of pregnancy: Secondary | ICD-10-CM | POA: Diagnosis not present

## 2016-04-02 DIAGNOSIS — Z3483 Encounter for supervision of other normal pregnancy, third trimester: Secondary | ICD-10-CM

## 2016-04-02 DIAGNOSIS — Z3493 Encounter for supervision of normal pregnancy, unspecified, third trimester: Secondary | ICD-10-CM

## 2016-04-02 LAB — POCT URINALYSIS DIPSTICK
Bilirubin, UA: NEGATIVE
Blood, UA: NEGATIVE
GLUCOSE UA: NEGATIVE
Ketones, UA: NEGATIVE
LEUKOCYTES UA: NEGATIVE
NITRITE UA: NEGATIVE
Spec Grav, UA: 1.015
UROBILINOGEN UA: 0.2
pH, UA: 7

## 2016-04-02 NOTE — Progress Notes (Signed)
Claudia Rogers- pt c/o chest congestion, has been using Robitussin, she is having pelvic pressure, having some pressure in her rectum

## 2016-04-02 NOTE — Progress Notes (Signed)
ROB and u/s: Indications: EFW and AFI for post dates Findings:  Singleton intrauterine pregnancy is visualized with FHR at 145 BPM. Biometrics give an (U/S) Gestational age of [redacted] weeks and 4 days and an (U/S) EDD of 04/12/16; this correlates with the clinically established EDD of 04/02/16.  Fetal presentation is vertex, spine left lateral.  EFW: 3581 grams ( 7 lbs. 14 oz. ) 56th %, Williams Placenta: Anterior, grade 2/3 AFI: 11.8 cm, adequate  Anatomic survey of the fetal kidneys, stomach and bladder appear WNL. Gender - Female.     Impression : 1. 38 week 4 day Viable Singleton Intrauterine pregnancy by U/S. 2. (U/S) EDD is consistent with Clinically established (LMP) EDD of 04/02/16. 3. EFW: 3581 grams ( 7 lbs. 14 oz. ) 4. AFI: 11.8 cm

## 2016-04-05 NOTE — OB Triage Provider Note (Signed)
L&D OB Triage Note  Claudia Rogers is a 30 y.o. G2P0010 female at 3848w3d, EDD Estimated Date of Delivery: 04/02/16 who presented to triage for complaints of small gush of fluid this am, none since.  She was evaluated by the nurses with no significant findings/findings significant for ruptured membranes or labor.  Nitrozine negative and perineum dry on exam. Vital signs stable. An NST was performed and has been reviewed by Me. She was reassured and discharged.   NST INTERPRETATION: Indications: rule out uterine contractions  Mode: External (EFM d/c'd for patient discharge home.) Baseline Rate (A): 130 bpm Variability: Moderate Accelerations: 15 x 15 Decelerations: None     Contraction Frequency (min): x1  Impression: reactive   Plan: NST performed was reviewed and was found to be reactive. She was discharged home with bleeding/labor precautions.  Continue routine prenatal care. Follow up with OB/GYN as previously scheduled.     Cristel Rail Suzan NailerN Koa Zoeller, CNM

## 2016-04-07 ENCOUNTER — Ambulatory Visit (INDEPENDENT_AMBULATORY_CARE_PROVIDER_SITE_OTHER): Payer: BC Managed Care – PPO | Admitting: Obstetrics and Gynecology

## 2016-04-07 VITALS — BP 119/66 | HR 84 | Wt 184.2 lb

## 2016-04-07 DIAGNOSIS — Z369 Encounter for antenatal screening, unspecified: Secondary | ICD-10-CM

## 2016-04-07 DIAGNOSIS — O480 Post-term pregnancy: Secondary | ICD-10-CM

## 2016-04-07 DIAGNOSIS — Z1389 Encounter for screening for other disorder: Secondary | ICD-10-CM

## 2016-04-07 LAB — POCT URINALYSIS DIPSTICK
BILIRUBIN UA: NEGATIVE
Glucose, UA: NEGATIVE
Ketones, UA: NEGATIVE
Leukocytes, UA: NEGATIVE
NITRITE UA: NEGATIVE
PH UA: 6
Protein, UA: NEGATIVE
RBC UA: NEGATIVE
Spec Grav, UA: 1.01
Urobilinogen, UA: NEGATIVE

## 2016-04-07 NOTE — Progress Notes (Signed)
NONSTRESS TEST INTERPRETATION  INDICATIONS: POST DATES  FHR baseline: 130   RESULTS: REACTIVE COMMENTS: Irregular contractions noted. Pt states she doesn't know whether or not she is leaking fluid. Pants stay damp (but this is nothing new) and she has noted x1 since last visit that "urine/water" ran down leg but not a lot. When she may cough she notices this also but not all the time. This has also happened in the past. Pt given labor precautions and advised to go to L&D especially if she feels leaking that does not stop or gush of fluid. No providers in office due to surgeries. Has appt tomorrow to see Dr. Valentino Saxonherry.    PLAN: 1. Continue fetal kick counts twice a day. 2. Continue antepartum testing as scheduled-Biweekly 3. RTO tomorrow as scheduled.  Fenton Mallingebbie Tandrea Kommer, LPN

## 2016-04-08 ENCOUNTER — Encounter: Payer: BC Managed Care – PPO | Admitting: Obstetrics and Gynecology

## 2016-04-08 ENCOUNTER — Ambulatory Visit (INDEPENDENT_AMBULATORY_CARE_PROVIDER_SITE_OTHER): Payer: BC Managed Care – PPO | Admitting: Obstetrics and Gynecology

## 2016-04-08 VITALS — BP 124/72 | HR 94 | Wt 184.4 lb

## 2016-04-08 DIAGNOSIS — O480 Post-term pregnancy: Secondary | ICD-10-CM

## 2016-04-08 DIAGNOSIS — Z3483 Encounter for supervision of other normal pregnancy, third trimester: Secondary | ICD-10-CM

## 2016-04-08 LAB — POCT URINALYSIS DIPSTICK
BILIRUBIN UA: NEGATIVE
Blood, UA: NEGATIVE
GLUCOSE UA: NEGATIVE
Ketones, UA: NEGATIVE
LEUKOCYTES UA: NEGATIVE
NITRITE UA: NEGATIVE
Protein, UA: NEGATIVE
Spec Grav, UA: 1.01
Urobilinogen, UA: NEGATIVE
pH, UA: 6

## 2016-04-09 ENCOUNTER — Inpatient Hospital Stay
Admission: RE | Admit: 2016-04-09 | Discharge: 2016-04-13 | DRG: 775 | Disposition: A | Discharge status: Home / Self Care | Payer: BC Managed Care – PPO | Source: Intra-hospital | Attending: Obstetrics and Gynecology | Admitting: Obstetrics and Gynecology

## 2016-04-09 DIAGNOSIS — Z833 Family history of diabetes mellitus: Secondary | ICD-10-CM

## 2016-04-09 DIAGNOSIS — O9081 Anemia of the puerperium: Secondary | ICD-10-CM | POA: Diagnosis not present

## 2016-04-09 DIAGNOSIS — O480 Post-term pregnancy: Secondary | ICD-10-CM | POA: Diagnosis present

## 2016-04-09 DIAGNOSIS — D649 Anemia, unspecified: Secondary | ICD-10-CM | POA: Diagnosis not present

## 2016-04-09 DIAGNOSIS — Z3A41 41 weeks gestation of pregnancy: Secondary | ICD-10-CM | POA: Diagnosis not present

## 2016-04-09 DIAGNOSIS — Z3483 Encounter for supervision of other normal pregnancy, third trimester: Secondary | ICD-10-CM | POA: Diagnosis not present

## 2016-04-09 DIAGNOSIS — O9962 Diseases of the digestive system complicating childbirth: Secondary | ICD-10-CM | POA: Diagnosis present

## 2016-04-09 DIAGNOSIS — K219 Gastro-esophageal reflux disease without esophagitis: Secondary | ICD-10-CM | POA: Diagnosis present

## 2016-04-09 DIAGNOSIS — Z823 Family history of stroke: Secondary | ICD-10-CM

## 2016-04-09 DIAGNOSIS — Z2839 Other underimmunization status: Secondary | ICD-10-CM

## 2016-04-09 DIAGNOSIS — O09899 Supervision of other high risk pregnancies, unspecified trimester: Secondary | ICD-10-CM

## 2016-04-09 DIAGNOSIS — O99824 Streptococcus B carrier state complicating childbirth: Secondary | ICD-10-CM | POA: Diagnosis present

## 2016-04-09 DIAGNOSIS — O9989 Other specified diseases and conditions complicating pregnancy, childbirth and the puerperium: Secondary | ICD-10-CM

## 2016-04-09 DIAGNOSIS — Z283 Underimmunization status: Secondary | ICD-10-CM

## 2016-04-09 LAB — CBC
HEMATOCRIT: 35.9 % (ref 35.0–47.0)
Hemoglobin: 12.5 g/dL (ref 12.0–16.0)
MCH: 31.9 pg (ref 26.0–34.0)
MCHC: 34.8 g/dL (ref 32.0–36.0)
MCV: 91.7 fL (ref 80.0–100.0)
Platelets: 365 10*3/uL (ref 150–440)
RBC: 3.92 MIL/uL (ref 3.80–5.20)
RDW: 13.3 % (ref 11.5–14.5)
WBC: 16.6 10*3/uL — AB (ref 3.6–11.0)

## 2016-04-09 LAB — TYPE AND SCREEN
ABO/RH(D): A POS
ANTIBODY SCREEN: NEGATIVE

## 2016-04-09 MED ORDER — ONDANSETRON HCL 4 MG/2ML IJ SOLN: 4.0000 mg | Freq: Four times a day (QID) | INTRAMUSCULAR | Status: DC | PRN

## 2016-04-09 MED ORDER — ACETAMINOPHEN 325 MG PO TABS: 650.0000 mg | ORAL_TABLET | ORAL | Status: DC | PRN

## 2016-04-09 MED ORDER — BUTORPHANOL TARTRATE 1 MG/ML IJ SOLN: 1.0000 mg | INTRAMUSCULAR | Status: DC | PRN

## 2016-04-09 MED ORDER — LACTATED RINGERS IV SOLN: 500.0000 mL | INTRAVENOUS | Status: DC | PRN

## 2016-04-09 MED ORDER — SODIUM CHLORIDE 0.9 % IV SOLN
1.0000 g | INTRAVENOUS | Status: DC
  Administered 2016-04-10 (×6): 1 g via INTRAVENOUS
  Filled 2016-04-09 (×8): qty 1000

## 2016-04-09 MED ORDER — OXYTOCIN 40 UNITS IN LACTATED RINGERS INFUSION - SIMPLE MED
2.5000 [IU]/h | INTRAVENOUS | Status: DC
  Administered 2016-04-11: 2.5 [IU]/h via INTRAVENOUS
  Filled 2016-04-09: qty 1000

## 2016-04-09 MED ORDER — OXYCODONE-ACETAMINOPHEN 5-325 MG PO TABS: 1.0000 | ORAL_TABLET | ORAL | Status: DC | PRN

## 2016-04-09 MED ORDER — MISOPROSTOL 25 MCG QUARTER TABLET
25.0000 ug | ORAL_TABLET | ORAL | Status: DC | PRN
Start: 2016-04-09 — End: 2016-04-09

## 2016-04-09 MED ORDER — SOD CITRATE-CITRIC ACID 500-334 MG/5ML PO SOLN: 30.0000 mL | ORAL | Status: DC | PRN

## 2016-04-09 MED ORDER — OXYCODONE-ACETAMINOPHEN 5-325 MG PO TABS: 2.0000 | ORAL_TABLET | ORAL | Status: DC | PRN

## 2016-04-09 MED ORDER — LIDOCAINE HCL (PF) 1 % IJ SOLN: 30.0000 mL | INTRAMUSCULAR | Status: DC | PRN

## 2016-04-09 MED ORDER — DINOPROSTONE 10 MG VA INST
10.0000 mg | VAGINAL_INSERT | Freq: Once | VAGINAL | Status: AC
Start: 2016-04-09 — End: 2016-04-09
  Administered 2016-04-09: 10 mg via VAGINAL
  Filled 2016-04-09: qty 1

## 2016-04-09 MED ORDER — LACTATED RINGERS IV SOLN
INTRAVENOUS | Status: DC
  Administered 2016-04-09 – 2016-04-10 (×2): via INTRAVENOUS
  Administered 2016-04-10: 125 mL via INTRAVENOUS

## 2016-04-09 MED ORDER — OXYTOCIN BOLUS FROM INFUSION
500.0000 mL | Freq: Once | INTRAVENOUS | Status: AC
  Administered 2016-04-11: 500 mL via INTRAVENOUS

## 2016-04-09 MED ORDER — TERBUTALINE SULFATE 1 MG/ML IJ SOLN: 0.2500 mg | Freq: Once | INTRAMUSCULAR | Status: DC | PRN

## 2016-04-09 MED ORDER — AMPICILLIN SODIUM 2 G IJ SOLR
2.0000 g | Freq: Once | INTRAMUSCULAR | Status: AC
  Administered 2016-04-09: 2 g via INTRAVENOUS
  Filled 2016-04-09: qty 2000

## 2016-04-09 NOTE — H&P (Addendum)
Obstetric History and Physical  Claudia Rogers is a 30 y.o. G2P0010 with IUP at 59110w0d presenting for elective IOL for post-dates pregnancy. Patient states she has been having irregular infrequent contractions, minimal vaginal bleeding, intact membranes, with active fetal movement.    Prenatal Course Source of Care: [facility] with onset of care at 8 weeks Pregnancy complications or risks: Patient Active Problem List   Diagnosis Date Noted  . Post-dates pregnancy 04/09/2016  . Positive testing for group B Streptococcus 03/10/2016  . Tachycardia 10/22/2015  . Rubella non-immune status, antepartum 08/27/2015  . Maternal varicella, non-immune 08/27/2015  . Dysmenorrhea 06/22/2015  . Pelvic pain in female 10/10/2014   She plans to breastfeed  Prenatal labs and studies: ABO, Rh: A/Positive/-- (04/13 1556) Antibody: Negative (04/13 1556) Rubella: <20.0 (04/13 1556) RPR: Non Reactive (04/13 1556)  HBsAg: Negative (04/13 1556)  HIV: Non Reactive (04/13 1556)  GBS: Positive (10/25 1720) 1 hr Glucola normal Genetic screening normal Anatomy US normal   Past Medical History:  Diagnosis Date  . Anxiety   . H/O viral meningitis   . History of chicken pox     Past Surgical History:  Procedure Laterality Date  . EYE MUSCLE SURGERY Bilateral 1996  . EYE MUSCLE SURGERY Bilateral 2000  . Odontoma Removal/implant  2003  . WISDOM TOOTH EXTRACTION      OB History  Gravida Para Term Preterm AB Living  2       1    SAB TAB Ectopic Multiple Live Births  1            # Outcome Date GA Lbr Len/2nd Weight Sex Delivery Anes PTL Lv  2 Current           1 SAB 07/20/14        FD      Social History   Social History  . Marital status: Married    Spouse name: N/A  . Number of children: N/A  . Years of education: N/A   Social History Main Topics  . Smoking status: Never Smoker  . Smokeless tobacco: Never Used  . Alcohol use 0.0 oz/week     Comment: Social  . Drug use:  No  . Sexual activity: Yes    Birth control/ protection: None   Other Topics Concern  . Not on file   Social History Narrative  . No narrative on file    Family History  Problem Relation Age of Onset  . Arthritis Mother   . High blood pressure Mother   . Diabetes Mother     Type II  . Lung cancer Father   . Arthritis Maternal Grandmother   . Stroke Maternal Grandmother   . High blood pressure Maternal Grandmother   . Diabetes Maternal Grandmother     Type II  . Alcoholism Maternal Aunt   . Alcoholism Maternal Uncle   . Alcoholism Cousin   . Lung cancer Paternal Aunt   . High blood pressure Sister   . Kidney disease Maternal Aunt   . Mental illness Sister   . Diabetes Brother     Type II-BKA  . Heart disease Neg Hx   . Cancer Neg Hx     Prescriptions Prior to Admission  Medication Sig Dispense Refill Last Dose  . cetirizine (ZYRTEC) 10 MG tablet Take 10 mg by mouth daily.   Taking  . guaiFENesin (ROBITUSSIN) 100 MG/5ML SOLN Take 5 mLs by mouth every 4 (four) hours as needed for  cough or to loosen phlegm.   Taking  . Prenatal Vit-Fe Fumarate-FA (MULTIVITAMIN-PRENATAL) 27-0.8 MG TABS tablet Take 1 tablet by mouth daily at 12 noon.   Taking  . ranitidine (ZANTAC) 150 MG tablet Take 1 tablet (150 mg total) by mouth 2 (two) times daily. 60 tablet 2 Taking  . Saccharomyces boulardii (PROBIOTIC) 250 MG CAPS Take 1 capsule by mouth daily.   Taking    No Known Allergies  Review of Systems: Negative except for what is mentioned in HPI.  Physical Exam: BP 114/66 (BP Location: Left Arm)   Pulse 92   Temp 97.6 F (36.4 C) (Oral)   Resp 18   Ht 5\' 3"  (1.6 m)   Wt 184 lb 6.4 oz (83.6 kg)   LMP 06/27/2015 (Exact Date)   BMI 32.66 kg/m  CONSTITUTIONAL: Well-developed, well-nourished female in no acute distress.  HENT:  Normocephalic, atraumatic, External right and left ear normal. Oropharynx is clear and moist EYES: Conjunctivae and EOM are normal. Pupils are equal, round,  and reactive to light. No scleral icterus.  NECK: Normal range of motion, supple, no masses SKIN: Skin is warm and dry. No rash noted. Not diaphoretic. No erythema. No pallor. NEUROLOGIC: Alert and oriented to person, place, and time. Normal reflexes, muscle tone coordination. No cranial nerve deficit noted. PSYCHIATRIC: Normal mood and affect. Normal behavior. Normal judgment and thought content. CARDIOVASCULAR: Normal heart rate noted, regular rhythm RESPIRATORY: Effort and breath sounds normal, no problems with respiration noted ABDOMEN: Soft, nontender, nondistended, gravid. MUSCULOSKELETAL: Normal range of motion. No edema and no tenderness. 2+ distal pulses.  Cervical Exam: Dilatation 1cm   Effacement 60%   Station -2   Presentation: cephalic FHT:  Baseline rate 135 bpm   Variability moderate  Accelerations present   Decelerations none Contractions: Irregular   Pertinent Labs/Studies:   Results for orders placed or performed during the hospital encounter of 04/09/16 (from the past 24 hour(s))  CBC     Status: Abnormal   Collection Time: 04/09/16  8:23 PM  Result Value Ref Range   WBC 16.6 (H) 3.6 - 11.0 K/uL   RBC 3.92 3.80 - 5.20 MIL/uL   Hemoglobin 12.5 12.0 - 16.0 g/dL   HCT 16.135.9 09.635.0 - 04.547.0 %   MCV 91.7 80.0 - 100.0 fL   MCH 31.9 26.0 - 34.0 pg   MCHC 34.8 32.0 - 36.0 g/dL   RDW 40.913.3 81.111.5 - 91.414.5 %   Platelets 365 150 - 440 K/uL  Type and screen     Status: None   Collection Time: 04/09/16  8:23 PM  Result Value Ref Range   ABO/RH(D) A POS    Antibody Screen NEG    Sample Expiration 04/12/2016     Assessment : Claudia Ehrichiffany R Uram is a 30 y.o. G2P0010 at 2818w0d being admitted for scheduled IOL for post-dates pregnancy.  GBS+.   Plan: Labor: Expectant management.  Induction with Cytotec, per protocol FWB: Reassuring fetal heart tracing.  GBS positive.  Will treat with Ampicillin.  Delivery plan: Hopeful for vaginal delivery.    Hildred LaserAnika Daphna Lafuente, MD [facility]

## 2016-04-09 NOTE — Progress Notes (Signed)
ROB: Doing well, no complaints. Had NST and sono yesterday for post-dates pregnancy, all wnl.  Discussed IOL vs continuing pregnancy for 1 additional week. Patient and husband desire IOL.  Scheduled for 04/09/16 (will come in evening prior for cervical ripening).

## 2016-04-10 ENCOUNTER — Inpatient Hospital Stay: Payer: BC Managed Care – PPO | Admitting: Anesthesiology

## 2016-04-10 ENCOUNTER — Encounter: Payer: Self-pay | Admitting: Anesthesiology

## 2016-04-10 LAB — CBC
HEMATOCRIT: 35.5 % (ref 35.0–47.0)
Hemoglobin: 12.4 g/dL (ref 12.0–16.0)
MCH: 32.2 pg (ref 26.0–34.0)
MCHC: 34.8 g/dL (ref 32.0–36.0)
MCV: 92.4 fL (ref 80.0–100.0)
Platelets: 347 10*3/uL (ref 150–440)
RBC: 3.84 MIL/uL (ref 3.80–5.20)
RDW: 13.2 % (ref 11.5–14.5)
WBC: 21 10*3/uL — AB (ref 3.6–11.0)

## 2016-04-10 MED ORDER — DIPHENHYDRAMINE HCL 25 MG PO CAPS: 25.0000 mg | ORAL_CAPSULE | ORAL | Status: DC | PRN

## 2016-04-10 MED ORDER — AMMONIA AROMATIC IN INHA
RESPIRATORY_TRACT | Status: AC
  Filled 2016-04-10: qty 10

## 2016-04-10 MED ORDER — SODIUM CHLORIDE 0.9 % IV SOLN
INTRAVENOUS | Status: DC | PRN
  Administered 2016-04-10 (×2): 5 mL via EPIDURAL

## 2016-04-10 MED ORDER — FENTANYL CITRATE (PF) 100 MCG/2ML IJ SOLN
100.0000 ug | INTRAMUSCULAR | Status: DC | PRN
  Administered 2016-04-10 (×3): 100 ug via INTRAVENOUS
  Filled 2016-04-10 (×2): qty 2

## 2016-04-10 MED ORDER — OXYTOCIN 40 UNITS IN LACTATED RINGERS INFUSION - SIMPLE MED
1.0000 m[IU]/min | INTRAVENOUS | Status: DC
  Administered 2016-04-10: 1 m[IU]/min via INTRAVENOUS

## 2016-04-10 MED ORDER — DIPHENHYDRAMINE HCL 50 MG/ML IJ SOLN: 12.5000 mg | INTRAMUSCULAR | Status: DC | PRN

## 2016-04-10 MED ORDER — KETOROLAC TROMETHAMINE 30 MG/ML IJ SOLN: 30.0000 mg | Freq: Four times a day (QID) | INTRAMUSCULAR | Status: DC | PRN

## 2016-04-10 MED ORDER — SODIUM CHLORIDE 0.9% FLUSH: 3.0000 mL | INTRAVENOUS | Status: DC | PRN

## 2016-04-10 MED ORDER — FENTANYL 2.5 MCG/ML W/ROPIVACAINE 0.2% IN NS 100 ML EPIDURAL INFUSION (ARMC-ANES)
EPIDURAL | Status: AC
  Filled 2016-04-10: qty 100

## 2016-04-10 MED ORDER — NALBUPHINE HCL 10 MG/ML IJ SOLN: 5.0000 mg | Freq: Once | INTRAMUSCULAR | Status: DC | PRN

## 2016-04-10 MED ORDER — NALBUPHINE HCL 10 MG/ML IJ SOLN: 5.0000 mg | INTRAMUSCULAR | Status: DC | PRN

## 2016-04-10 MED ORDER — FENTANYL 2.5 MCG/ML W/ROPIVACAINE 0.2% IN NS 100 ML EPIDURAL INFUSION (ARMC-ANES): 10.0000 mL/h | EPIDURAL | Status: DC

## 2016-04-10 MED ORDER — FENTANYL 2.5 MCG/ML W/ROPIVACAINE 0.2% IN NS 100 ML EPIDURAL INFUSION (ARMC-ANES)
EPIDURAL | Status: DC | PRN
  Administered 2016-04-10: 10 mL/h via EPIDURAL

## 2016-04-10 MED ORDER — LIDOCAINE-EPINEPHRINE (PF) 1.5 %-1:200000 IJ SOLN
INTRAMUSCULAR | Status: DC | PRN
  Administered 2016-04-10: 3 mL via EPIDURAL

## 2016-04-10 MED ORDER — FENTANYL CITRATE (PF) 100 MCG/2ML IJ SOLN
INTRAMUSCULAR | Status: AC
  Administered 2016-04-10: 100 ug via INTRAVENOUS
  Filled 2016-04-10: qty 2

## 2016-04-10 MED ORDER — ONDANSETRON HCL 4 MG/2ML IJ SOLN: 4.0000 mg | Freq: Three times a day (TID) | INTRAMUSCULAR | Status: DC | PRN

## 2016-04-10 MED ORDER — LIDOCAINE HCL (PF) 1 % IJ SOLN
INTRAMUSCULAR | Status: AC
  Filled 2016-04-10: qty 30

## 2016-04-10 MED ORDER — NALOXONE HCL 0.4 MG/ML IJ SOLN: 0.4000 mg | INTRAMUSCULAR | Status: DC | PRN

## 2016-04-10 MED ORDER — LIDOCAINE HCL (PF) 1 % IJ SOLN
INTRAMUSCULAR | Status: DC | PRN
  Administered 2016-04-10: 3 mL via SUBCUTANEOUS

## 2016-04-10 MED ORDER — BUPIVACAINE HCL (PF) 0.25 % IJ SOLN
INTRAMUSCULAR | Status: DC | PRN
  Administered 2016-04-10 (×2): 5 mL via EPIDURAL

## 2016-04-10 MED ORDER — OXYTOCIN 10 UNIT/ML IJ SOLN
INTRAMUSCULAR | Status: AC
  Filled 2016-04-10: qty 2

## 2016-04-10 MED ORDER — MEPERIDINE HCL 25 MG/ML IJ SOLN: 6.2500 mg | INTRAMUSCULAR | Status: DC | PRN

## 2016-04-10 MED ORDER — DEXTROSE 5 % IV SOLN: 1.0000 ug/kg/h | INTRAVENOUS | Status: DC | PRN

## 2016-04-10 MED ORDER — MISOPROSTOL 200 MCG PO TABS
ORAL_TABLET | ORAL | Status: AC
  Filled 2016-04-10: qty 4

## 2016-04-10 NOTE — Progress Notes (Signed)
Intrapartum Progress Note  S: Patient noting contractions, mild to moderate  O: Blood pressure 123/68, pulse 89, temperature 98.4 F (36.9 C), temperature source Oral, resp. rate 18, height 5\' 3"  (1.6 m), weight 184 lb 6.4 oz (83.6 kg), last menstrual period 06/27/2015. Gen App: NAD, comfortable Abdomen: soft, gravid FHT: baseline 130 bpm.  Accels present.  Decels absent. moderate in degree variability.   Tocometer: contractions q 2-4 minutes Cervix: 2/70/-3/intact Extremities: Nontender, no edema.  Labs: No new labs    Assessment:  1: SIUP at 4511w1d 2. GBS+  Plan:  1. Continue IOL for post-dates. AROM'd with clear fluid. Will initiate Pitocin.  2. Continue Ampicillin for GBS prophylaxis.  Has received adequate treatment.    Hildred LaserAnika Nyheem Binette, MD 04/10/2016 12:21 PM

## 2016-04-10 NOTE — Progress Notes (Signed)
Intrapartum Progress Note  S: Patient feeling ok.  Recently s/p epidural.  Still noting feeling ctx on left side.   O: Blood pressure 114/68, pulse 88, temperature 98.3 F (36.8 C), temperature source Oral, resp. rate 17, height 5\' 3"  (1.6 m), weight 184 lb 6.4 oz (83.6 kg), last menstrual period 06/27/2015. Gen App: NAD, comfortable Abdomen: soft, gravid FHT: baseline 125 bpm.  Accels present.  Decels present - early. moderate in degree variability.   Tocometer: contractions q 2 minutes Cervix: 3-3.5/80/-3/intact Extremities: Nontender, no edema.  Pitocin: 4 mIU  Labs: No new labs   Assessment:  1: SIUP at 2793w1d, post-dates pregnancy 2. GBS+  Plan:  1. Continue IOL for post-dates. Continue Pitocin per protocol..  2. Continue Ampicillin for GBS prophylaxis.  Has received adequate treatment.    Hildred LaserAnika Hebah Bogosian, MD 04/10/2016 5:53 PM

## 2016-04-10 NOTE — Anesthesia Procedure Notes (Signed)
Epidural Patient location during procedure: OB Start time: 04/10/2016 5:20 PM End time: 04/10/2016 5:28 PM  Staffing Anesthesiologist: Lenard SimmerKARENZ, [place] Performed: anesthesiologist   Preanesthetic Checklist Completed: patient identified, site marked, surgical consent, pre-op evaluation, timeout performed, IV checked, risks and benefits discussed and monitors and equipment checked  Epidural Patient position: sitting Prep: ChloraPrep Patient monitoring: heart rate, continuous pulse ox and blood pressure Approach: midline Location: L3-L4 Injection technique: LOR saline  Needle:  Needle type: Tuohy  Needle gauge: 17 G Needle length: 9 cm and 9 Needle insertion depth: 5 cm Catheter type: closed end flexible Catheter size: 19 Gauge Catheter at skin depth: 10 cm Test dose: negative and 1.5% lidocaine with Epi 1:200 K  Assessment Sensory level: T10 Events: blood not aspirated, injection not painful, no injection resistance, negative IV test and no paresthesia  Additional Notes Pt. Evaluated and documentation done after procedure finished. Patient identified. Risks/Benefits/Options discussed with patient including but not limited to bleeding, infection, nerve damage, paralysis, failed block, incomplete pain control, headache, blood pressure changes, nausea, vomiting, reactions to medication both or allergic, itching and postpartum back pain. Confirmed with bedside nurse the patient's most recent platelet count. Confirmed with patient that they are not currently taking any anticoagulation, have any bleeding history or any family history of bleeding disorders. Patient expressed understanding and wished to proceed. All questions were answered. Sterile technique was used throughout the entire procedure. Please see nursing notes for vital signs. Test dose was given through epidural catheter and negative prior to continuing to dose epidural or start infusion. Warning signs of high block given to  the patient including shortness of breath, tingling/numbness in hands, complete motor block, or any concerning symptoms with instructions to call for help. Patient was given instructions on fall risk and not to get out of bed. All questions and concerns addressed with instructions to call with any issues or inadequate analgesia.   Patient tolerated the insertion well without immediate complications.Reason for block:procedure for pain

## 2016-04-10 NOTE — Anesthesia Preprocedure Evaluation (Signed)
Anesthesia Evaluation  Patient identified by MRN, date of birth, ID band Patient awake    Reviewed: Allergy & Precautions, H&P , NPO status , Patient's Chart, lab work & pertinent test results, reviewed documented beta blocker date and time   History of Anesthesia Complications Negative for: history of anesthetic complications  Airway Mallampati: II  TM Distance: >3 FB Neck ROM: full    Dental  (+) Teeth Intact   Pulmonary Recent URI ,           Cardiovascular Exercise Tolerance: Good negative cardio ROS       Neuro/Psych negative neurological ROS  negative psych ROS   GI/Hepatic Neg liver ROS, GERD  Medicated,  Endo/Other  negative endocrine ROS  Renal/GU negative Renal ROS  negative genitourinary   Musculoskeletal   Abdominal   Peds  Hematology negative hematology ROS (+)   Anesthesia Other Findings Past Medical History: No date: Anxiety No date: H/O viral meningitis No date: History of chicken pox   Reproductive/Obstetrics (+) Pregnancy                             Anesthesia Physical Anesthesia Plan  ASA: II  Anesthesia Plan: Epidural   Post-op Pain Management:    Induction:   Airway Management Planned:   Additional Equipment:   Intra-op Plan:   Post-operative Plan:   Informed Consent: I have reviewed the patients History and Physical, chart, labs and discussed the procedure including the risks, benefits and alternatives for the proposed anesthesia with the patient or authorized representative who has indicated his/her understanding and acceptance.   Dental Advisory Given  Plan Discussed with: Anesthesiologist, CRNA and Surgeon  Anesthesia Plan Comments:         Anesthesia Quick Evaluation

## 2016-04-11 DIAGNOSIS — Z3483 Encounter for supervision of other normal pregnancy, third trimester: Secondary | ICD-10-CM

## 2016-04-11 LAB — VARICELLA ZOSTER ANTIBODY, IGG: VARICELLA IGG: 657 {index} (ref >165)

## 2016-04-11 LAB — RUBELLA SCREEN: Rubella: 1.73 index (ref >0.99)

## 2016-04-11 LAB — RPR: RPR Ser Ql: NONREACTIVE

## 2016-04-11 MED ORDER — WHITE PETROLATUM GEL
Status: AC
  Filled 2016-04-11: qty 15

## 2016-04-11 MED ORDER — HYDROCODONE-ACETAMINOPHEN 5-325 MG PO TABS
1.0000 | ORAL_TABLET | ORAL | Status: DC | PRN
  Administered 2016-04-11 – 2016-04-12 (×5): 1 via ORAL
  Filled 2016-04-11 (×4): qty 1

## 2016-04-11 MED ORDER — ONDANSETRON HCL 4 MG PO TABS: 4.0000 mg | ORAL_TABLET | ORAL | Status: DC | PRN

## 2016-04-11 MED ORDER — DOCUSATE SODIUM 100 MG PO CAPS
100.0000 mg | ORAL_CAPSULE | Freq: Two times a day (BID) | ORAL | Status: DC
  Administered 2016-04-11 – 2016-04-13 (×4): 100 mg via ORAL
  Filled 2016-04-11 (×4): qty 1

## 2016-04-11 MED ORDER — DIPHENHYDRAMINE HCL 25 MG PO CAPS: 25.0000 mg | ORAL_CAPSULE | Freq: Four times a day (QID) | ORAL | Status: DC | PRN

## 2016-04-11 MED ORDER — HYDROCODONE-ACETAMINOPHEN 5-325 MG PO TABS
ORAL_TABLET | ORAL | Status: AC
  Administered 2016-04-11: 1 via ORAL
  Filled 2016-04-11: qty 1

## 2016-04-11 MED ORDER — WITCH HAZEL-GLYCERIN EX PADS
1.0000 "application " | MEDICATED_PAD | CUTANEOUS | Status: DC | PRN
  Administered 2016-04-11: 1 via TOPICAL
  Filled 2016-04-11: qty 100

## 2016-04-11 MED ORDER — DIBUCAINE 1 % RE OINT
1.0000 "application " | TOPICAL_OINTMENT | RECTAL | Status: DC | PRN
  Administered 2016-04-11: 1 via RECTAL
  Filled 2016-04-11: qty 28

## 2016-04-11 MED ORDER — ZOLPIDEM TARTRATE 5 MG PO TABS: 5.0000 mg | ORAL_TABLET | Freq: Every evening | ORAL | Status: DC | PRN

## 2016-04-11 MED ORDER — SIMETHICONE 80 MG PO CHEW: 80.0000 mg | CHEWABLE_TABLET | ORAL | Status: DC | PRN

## 2016-04-11 MED ORDER — IBUPROFEN 600 MG PO TABS
600.0000 mg | ORAL_TABLET | Freq: Four times a day (QID) | ORAL | Status: DC
  Administered 2016-04-11 (×2): 600 mg via ORAL
  Filled 2016-04-11: qty 1

## 2016-04-11 MED ORDER — IBUPROFEN 600 MG PO TABS
ORAL_TABLET | ORAL | Status: AC
  Administered 2016-04-11: 600 mg via ORAL
  Filled 2016-04-11: qty 1

## 2016-04-11 MED ORDER — COCONUT OIL OIL
1.0000 "application " | TOPICAL_OIL | Status: DC | PRN
  Administered 2016-04-11: 1 via TOPICAL
  Filled 2016-04-11: qty 120

## 2016-04-11 MED ORDER — ACETAMINOPHEN 325 MG PO TABS
650.0000 mg | ORAL_TABLET | ORAL | Status: DC | PRN
Start: 2016-04-11 — End: 2016-04-13

## 2016-04-11 MED ORDER — IBUPROFEN 600 MG PO TABS
600.0000 mg | ORAL_TABLET | Freq: Four times a day (QID) | ORAL | Status: DC
  Administered 2016-04-11 – 2016-04-13 (×8): 600 mg via ORAL
  Filled 2016-04-11 (×8): qty 1

## 2016-04-11 MED ORDER — BENZOCAINE-MENTHOL 20-0.5 % EX AERO
1.0000 "application " | INHALATION_SPRAY | CUTANEOUS | Status: DC | PRN
  Administered 2016-04-11: 1 via TOPICAL
  Filled 2016-04-11 (×2): qty 56

## 2016-04-11 MED ORDER — PRENATAL MULTIVITAMIN CH
1.0000 | ORAL_TABLET | Freq: Every day | ORAL | Status: DC
  Administered 2016-04-11 – 2016-04-13 (×3): 1 via ORAL
  Filled 2016-04-11 (×3): qty 1

## 2016-04-11 MED ORDER — ONDANSETRON HCL 4 MG/2ML IJ SOLN: 4.0000 mg | INTRAMUSCULAR | Status: DC | PRN

## 2016-04-11 NOTE — Progress Notes (Signed)
Intrapartum Progress Note  S: Patient resting comfortably.   O: Blood pressure 122/65, pulse 73, temperature 98.3 F (36.8 C), temperature source Oral, resp. rate 16, height 5\' 3"  (1.6 m), weight 184 lb 6.4 oz (83.6 kg), last menstrual period 06/27/2015. Gen App: NAD, comfortable Abdomen: soft, gravid FHT: baseline 130 bpm.  Accels present.  Decels present - early. moderate in degree variability.   Tocometer: contractions q 2 minutes Cervix: 10/100/+1 to +2/AROM at last check, clear fluids Extremities: Nontender, no edema.  Pitocin: 3 mIU  Labs: No new labs   Assessment:  1: SIUP at 830w1d, post-dates pregnancy 2. GBS+  Plan:  1. Continue IOL for post-dates. Continue Pitocin per protocol..  2. Continue Ampicillin for GBS prophylaxis.  Has received adequate treatment.  3. Anticipate vaginal delivery soon.   Hildred LaserAnika Pricilla Moehle, MD 04/11/2016 1:43 AM

## 2016-04-12 LAB — CBC
HCT: 27.6 % — ABNORMAL LOW (ref 35.0–47.0)
Hemoglobin: 9.4 g/dL — ABNORMAL LOW (ref 12.0–16.0)
MCH: 31.2 pg (ref 26.0–34.0)
MCHC: 34.1 g/dL (ref 32.0–36.0)
MCV: 91.5 fL (ref 80.0–100.0)
PLATELETS: 227 10*3/uL (ref 150–440)
RBC: 3.01 MIL/uL — ABNORMAL LOW (ref 3.80–5.20)
RDW: 13 % (ref 11.5–14.5)
WBC: 17.7 10*3/uL — ABNORMAL HIGH (ref 3.6–11.0)

## 2016-04-12 MED ORDER — WHITE PETROLATUM GEL
Status: AC
  Filled 2016-04-12: qty 10

## 2016-04-12 MED ORDER — FERROUS SULFATE 325 (65 FE) MG PO TABS
325.0000 mg | ORAL_TABLET | Freq: Every day | ORAL | Status: DC
  Administered 2016-04-12 – 2016-04-13 (×2): 325 mg via ORAL
  Filled 2016-04-12 (×2): qty 1

## 2016-04-12 MED ORDER — IBUPROFEN 800 MG PO TABS: 800.0000 mg | ORAL_TABLET | Freq: Three times a day (TID) | ORAL | 1 refills | Status: DC | PRN

## 2016-04-12 MED ORDER — FERROUS SULFATE 325 (65 FE) MG PO TABS: 325.0000 mg | ORAL_TABLET | Freq: Every day | ORAL | 0 refills | Status: DC

## 2016-04-12 NOTE — Anesthesia Postprocedure Evaluation (Signed)
Anesthesia Post Note  Patient: Claudia Rogers  Procedure(s) Performed: * No procedures listed *  Patient location during evaluation: Mother Baby Anesthesia Type: Epidural Level of consciousness: awake and alert and oriented Pain management: pain level controlled Vital Signs Assessment: post-procedure vital signs reviewed and stable Respiratory status: spontaneous breathing Cardiovascular status: blood pressure returned to baseline Postop Assessment: no headache, no backache and patient able to bend at knees Anesthetic complications: no    Last Vitals:  Vitals:   04/12/16 0731 04/12/16 1130  BP: 117/64 110/62  Pulse: 70 71  Resp:  16  Temp: 36.7 C 36.6 C    Last Pain:  Vitals:   04/12/16 1130  TempSrc: Oral  PainSc: 4                  Lenora Gomes

## 2016-04-12 NOTE — Progress Notes (Signed)
Post Partum Day # 1, s/p SVD  Subjective: no complaints, up ad lib, voiding and tolerating PO  Objective: Temp:  [98.1 F (36.7 C)-98.5 F (36.9 C)] 98.1 F (36.7 C) (12/02 0731) Pulse Rate:  [70-92] 70 (12/02 0731) Resp:  [16-18] 18 (12/02 0014) BP: (99-117)/(58-73) 117/64 (12/02 0731) SpO2:  [98 %-100 %] 99 % (12/02 0731)  Physical Exam:  General: alert and no distress  Lungs: clear to auscultation bilaterally Breasts: normal appearance, no masses or tenderness Heart: regular rate and rhythm, S1, S2 normal, no murmur, click, rub or gallop Pelvis: Lochia: appropriate, Uterine Fundus: firm Extremities: DVT Evaluation: No evidence of DVT seen on physical exam. Negative Homan's sign. No cords or calf tenderness. No significant calf/ankle edema.   Recent Labs  04/10/16 1651 04/12/16 0522  HGB 12.4 9.4*  HCT 35.5 27.6*    Assessment/Plan: Plan for discharge tomorrow, Breastfeeding, Circumcision prior to discharge and Contraception undecided. Discussed briefly options for contraception.  Can discuss further at 6 week postpartum visit.  Mild anemia postpartum, asymptomatic. Can treat with PO iron supplementation.    LOS: 3 days   Hildred LaserAnika Kaydon Creedon [facility]

## 2016-04-12 NOTE — Discharge Instructions (Signed)

## 2016-04-12 NOTE — Lactation Note (Signed)
This note was copied from a baby's chart. Lactation Consultation Note  Patient Name: Claudia Rogers [id number]'s Date: 04/12/2016 Reason for consult: Follow-up assessment   Maternal Data  Mom states baby breastfed well yesterday, but after circ and paci use last night, he has not nursed as well and she now has cracked bleeding nipples. I gently discussed how paci may contiribute to some of these issues, but we can still work on position and latch. I taught her sidelying, but baby did not get a deep enough latch and he pinched her nipple. The football hold owrked better , but Mom so sore and areola has some edema so I gave her breast shells to wear between feeds to every nipple and help heal them and then tried 24 mm nipple shield. This did help him obtain/maintain a deepr latch and we heard more swallows. I made sure Mom could apply NS correctly herself and that she and dad could correctly get baby positioned and latch without LC help. They did great. Mom does need to compress breast and dad needs to stim baby to keep him nusrsng and not sleeping at breast. Her nursed well for over 15 minutes without more nipple damage. Paretns to keep feeding often and resting/sleeping between feeds. LC to F/U tomorrow.   Feeding Feeding Type: Breast Fed Length of feed: 15 min  LATCH Score/Interventions Latch: Repeated attempts needed to sustain latch, nipple held in mouth throughout feeding, stimulation needed to elicit sucking reflex. Intervention(s): Assist with latch  Audible Swallowing: A few with stimulation Intervention(s): Hand expression  Type of Nipple: Everted at rest and after stimulation  Comfort (Breast/Nipple): Filling, red/small blisters or bruises, mild/mod discomfort  Problem noted: Cracked, bleeding, blisters, bruises Interventions  (Cracked/bleeding/bruising/blister): Expressed breast milk to nipple  Hold (Positioning): No assistance needed to correctly position infant at  breast. Intervention(s): Support Pillows  LATCH Score: 7  Lactation Tools Discussed/Used Tools: Nipple Claudia Rogers   Consult Status Consult Status: Follow-up Date: 04/13/16 Follow-up type: In-patient    Claudia Rogers Claudia Rogers 04/12/2016, 5:58 PM

## 2016-04-13 MED ORDER — WHITE PETROLATUM GEL
Status: AC
  Filled 2016-04-13: qty 15

## 2016-04-13 NOTE — Progress Notes (Signed)
Post Partum Day # 2, s/p SVD  Subjective: no complaints, up ad lib, voiding and tolerating PO  Objective: Vitals:   04/12/16 1130 04/12/16 1605 04/12/16 1927 04/13/16 0706  BP: 110/62  110/62 (!) 111/36  Pulse: 71  80 73  Resp: 16  18 16   Temp: 97.8 F (36.6 C) 98.4 F (36.9 C) 97.8 F (36.6 C) 98.4 F (36.9 C)  TempSrc: Oral  Oral Oral  SpO2: 99%   99%  Weight:      Height:        Physical Exam:  General: alert and no distress  Lungs: clear to auscultation bilaterally Breasts: normal appearance, no masses or tenderness Heart: regular rate and rhythm, S1, S2 normal, no murmur, click, rub or gallop Pelvis: Lochia: appropriate, Uterine Fundus: firm Extremities: DVT Evaluation: No evidence of DVT seen on physical exam. Negative Homan's sign. No cords or calf tenderness. No significant calf/ankle edema.   Recent Labs  04/10/16 1651 04/12/16 0522  HGB 12.4 9.4*  HCT 35.5 27.6*    Assessment/Plan: Discharge home, Breastfeeding, Circumcision prior to discharge and Contraception undecided.   Options for contraception will be included with discharge instructions.  Mild anemia postpartum, asymptomatic. Can treat with PO iron supplementation.    LOS: 4 days   Hildred LaserAnika Donovin Kraemer [facility]

## 2016-04-13 NOTE — Progress Notes (Signed)
D/C instructions provided, pt states understanding, aware of follow up appt.      

## 2016-04-13 NOTE — Discharge Summary (Signed)
Obstetric Discharge Summary Reason for Admission: induction of labor for postdates pregnancy Prenatal Procedures: NST and ultrasound Intrapartum Procedures: spontaneous vaginal delivery and GBS prophylaxis Postpartum Procedures: none Complications-Operative and Postpartum: 1st degree degree perineal laceration Hemoglobin  Date Value Ref Range Status  04/12/2016 9.4 (L) 12.0 - 16.0 g/dL Final    Comment:    RESULT REPEATED AND VERIFIED   HCT  Date Value Ref Range Status  04/12/2016 27.6 (L) 35.0 - 47.0 % Final   Hematocrit  Date Value Ref Range Status  01/10/2016 33.5 (L) 34.0 - 46.6 % Final    Physical Exam:  Blood pressure (!) 111/36, pulse 73, temperature 98.4 F (36.9 C), temperature source Oral, resp. rate 16, height 5\' 3"  (1.6 m), weight 184 lb 6.4 oz (83.6 kg), last menstrual period 06/27/2015, SpO2 99 %.  General: alert and no distress Lochia: appropriate Uterine Fundus: firm Incision: none DVT Evaluation: Negative Homan's sign. No cords or calf tenderness. No significant calf/ankle edema.  Discharge Diagnoses: Post-date pregnancy - delivered; Postpartum anemia  Discharge Information: Date: 04/13/2016 Activity: pelvic rest Diet: routine Medications: PNV, Ibuprofen, Colace and Iron Condition: stable Instructions: refer to practice specific booklet Discharge to: home Follow-up Information    Melody Suzan NailerN Shambley, CNM. Schedule an appointment as soon as possible for a visit in 6 week(s).   Specialties:  Obstetrics and Gynecology, Radiology Why:  For postpartum visit Contact information: [address] [place] [address] [phone]           Newborn Data: Live born female  Birth Weight: 7 lb 4.4 oz (3300 g) APGAR: 8, 9  Home with mother.  Claudia Rogers 04/13/2016, 11:04 AM

## 2016-04-13 NOTE — Progress Notes (Signed)
Pt D/C, to car via wheelchair per staff.  

## 2016-04-14 NOTE — Progress Notes (Signed)
NST performed today was reviewed and was found to be reactive.  Continue recommended antenatal testing and prenatal care.  Kaikoa Magro, MD [facility]  

## 2016-04-21 ENCOUNTER — Other Ambulatory Visit (INDEPENDENT_AMBULATORY_CARE_PROVIDER_SITE_OTHER): Payer: BC Managed Care – PPO

## 2016-04-21 DIAGNOSIS — R300 Dysuria: Secondary | ICD-10-CM

## 2016-04-21 LAB — POCT URINALYSIS DIPSTICK
BILIRUBIN UA: NEGATIVE
GLUCOSE UA: NEGATIVE
Ketones, UA: NEGATIVE
NITRITE UA: POSITIVE
Spec Grav, UA: 1.01
Urobilinogen, UA: 0.2
pH, UA: 6.5

## 2016-04-21 MED ORDER — NITROFURANTOIN MONOHYD MACRO 100 MG PO CAPS: 100.0000 mg | ORAL_CAPSULE | Freq: Two times a day (BID) | ORAL | 1 refills | Status: DC

## 2016-04-23 LAB — URINE CULTURE

## 2016-05-23 ENCOUNTER — Ambulatory Visit (INDEPENDENT_AMBULATORY_CARE_PROVIDER_SITE_OTHER): Payer: BC Managed Care – PPO | Admitting: Obstetrics and Gynecology

## 2016-05-23 ENCOUNTER — Encounter: Payer: Self-pay | Admitting: Obstetrics and Gynecology

## 2016-05-23 NOTE — Patient Instructions (Signed)
  Place postpartum visit patient instructions here.  

## 2016-05-23 NOTE — Progress Notes (Signed)
   Subjective:     Claudia Rogers is a 31 y.o. female who presents for a postpartum visit. She is 6 weeks postpartum following a spontaneous vaginal delivery. I have fully reviewed the prenatal and intrapartum course. The delivery was at 41 gestational weeks. Outcome: spontaneous vaginal delivery. Anesthesia: epidural. Postpartum course has been uncomplicated. Baby's course has been uncomplicated. Baby is feeding by breast. Bleeding no bleeding. Bowel function is normal. Bladder function is normal. Patient is not sexually active. Contraception method is abstinence. Postpartum depression screening: negative.  The following portions of the patient's history were reviewed and updated as appropriate: allergies, current medications, past family history, past medical history, past social history, past surgical history and problem list.  Review of Systems A comprehensive review of systems was negative.   Objective:    BP 116/66   Pulse 79   Ht 5\' 3"  (1.6 m)   Wt 163 lb 3.2 oz (74 kg)   LMP 06/27/2015 (Exact Date)   Breastfeeding? Yes   BMI 28.91 kg/m   General:  alert, cooperative and appears stated age   Breasts:  inspection negative, no nipple discharge or bleeding, no masses or nodularity palpable  Lungs: clear to auscultation bilaterally  Heart:  regular rate and rhythm, S1, S2 normal, no murmur, click, rub or gallop  Abdomen: soft, non-tender; bowel sounds normal; no masses,  no organomegaly   Vulva:  normal  Vagina: normal vagina, no discharge, exudate, lesion, or erythema  Cervix:  multiparous appearance  Corpus: normal size, contour, position, consistency, mobility, non-tender  Adnexa:  normal adnexa and no mass, fullness, tenderness  Rectal Exam: Not performed.        Assessment:     6 weeks postpartum exam. Pap smear not done at today's visit.   Plan:    1. Contraception: condoms 2. PP anemia- labs rechecked 3. Follow up in: 4 weeks or as needed.

## 2016-05-24 LAB — CBC
Hematocrit: 38.8 % (ref 34.0–46.6)
Hemoglobin: 12.5 g/dL (ref 11.1–15.9)
MCH: 29.3 pg (ref 26.6–33.0)
MCHC: 32.2 g/dL (ref 31.5–35.7)
MCV: 91 fL (ref 79–97)
Platelets: 343 10*3/uL (ref 150–379)
RBC: 4.26 x10E6/uL (ref 3.77–5.28)
RDW: 13.1 % (ref 12.3–15.4)
WBC: 8.5 10*3/uL (ref 3.4–10.8)

## 2016-05-24 LAB — VITAMIN D 25 HYDROXY (VIT D DEFICIENCY, FRACTURES): Vit D, 25-Hydroxy: 33.2 ng/mL (ref 30.0–100.0)

## 2016-05-24 LAB — FERRITIN: Ferritin: 36 ng/mL (ref 15–150)

## 2016-06-02 ENCOUNTER — Telehealth: Payer: Self-pay

## 2016-06-02 NOTE — Telephone Encounter (Signed)
Since her symptoms started on 1/18 - she is out of the 48 hour window for tamiflu to work for the flu    I hope she starts feeling better soon, f/u if no improvement or worse Agree with sympt care rec

## 2016-06-02 NOTE — Telephone Encounter (Signed)
PLEASE NOTE: All timestamps contained within this report are represented as [place] Standard Time. CONFIDENTIALTY NOTICE: This fax transmission is intended only for the addressee. It contains information that is legally privileged, confidential or otherwise protected from use or disclosure. If you are not the intended recipient, you are strictly prohibited from reviewing, disclosing, copying using or disseminating any of this information or taking any action in reliance on or regarding this information. If you have received this fax in error, please notify us immediately by telephone so that we can arrange for its return to [place]. Phone: [phone], Toll-Free: [phone], Fax: [phone] Page: 1 of 2 Call Id: [id number] [facility] Night - Client TELEPHONE ADVICE RECORD [facility] Patient Name: Claudia Rogers Gender: Female DOB: Jul 31, 1985 Age: 31 Y 7 M 9 D Return Phone Number: [phone] (Primary) Address: City/State/Zip: [place] Client [facility] Night - Client Client Site [facility] [place] - Night Physician [place], Idamae Schuller - MD Contact Type Call Who Is Calling Patient / Member / Family / Caregiver Call Type Triage / Clinical Relationship To Patient Self Return Phone Number [phone] (Primary) Chief Complaint Flu Symptom Reason for Call Symptomatic / Request for Health Information Initial Comment CBWN Caller states thinks she has the flu and should she take Tamiflu? PreDisposition Call Doctor Translation No Nurse Assessment Nurse: Durwin Nora, RN, Bridgett Date/Time (Eastern Time): 06/01/2016 7:26:23 AM Confirm and document reason for call. If symptomatic, describe symptoms. ---Caller states she thinks she has the flu. States has had fever of 101 intermittently. States on 05/29/2016 she started having muscle aches, sore throat, cough, runny nose. States she has been taking Tylenol for fever. Does the patient  have any new or worsening symptoms? ---Yes Will a triage be completed? ---Yes Related visit to physician within the last 2 weeks? ---No Does the PT have any chronic conditions? (i.e. diabetes, asthma, etc.) ---No Is the patient pregnant or possibly pregnant? (Ask all females between the ages of 71-55) ---No Is this a behavioral health or substance abuse call? ---No Guidelines Guideline Title Affirmed Question Affirmed Notes Nurse Date/Time Lamount Cohen Time) Influenza - Seasonal Earache Dixon, RN, Bridgett 06/01/2016 7:31:49 AM Disp. Time Lamount Cohen Time) Disposition Final User 06/01/2016 6:36:50 AM Send To Call Back Waiting For Nurse Frutoso Chase 06/01/2016 7:39:14 AM See Physician within 24 Hours Yes Durwin Nora, RN, Bridgett Caller Understands: Yes PLEASE NOTE: All timestamps contained within this report are represented as [place] Standard Time. CONFIDENTIALTY NOTICE: This fax transmission is intended only for the addressee. It contains information that is legally privileged, confidential or otherwise protected from use or disclosure. If you are not the intended recipient, you are strictly prohibited from reviewing, disclosing, copying using or disseminating any of this information or taking any action in reliance on or regarding this information. If you have received this fax in error, please notify us immediately by telephone so that we can arrange for its return to [place]. Phone: [phone], Toll-Free: [phone], Fax: [phone] Page: 2 of 2 Call Id: [id number] Disagree/Comply: Comply Care Advice Given Per Guideline SEE PHYSICIAN WITHIN 24 HOURS: * IF OFFICE WILL BE CLOSED AND NO PCP TRIAGE: You need to be seen within the next 24 hours. An urgent care center is often a good source of care if your doctor's office is closed. INFLUENZA - GENERAL CARE ADVICE * Cough: Use cough drops. * Feeling dehydrated: Drink extra liquids. If the air in your home is dry, use a humidifier. * Fever: For  fever over 101 F (38.3 C), take acetaminophen every 4-6 hours (Adults 650 mg) OR ibuprofen every 6-8 hours (Adults 400-600 mg). * Muscle aches, headache, and other pains: Often this comes and goes with the fever. Take acetaminophen every 4-6 hours (Adults 650 mg) OR ibuprofen every 6-8 hours (Adults 400-600 mg). * Sore throat: Try throat lozenges, hard candy or warm chicken broth. PAIN MEDICINES: ACETAMINOPHEN (E.G., TYLENOL): * Take 650 mg (two 325 mg pills) by mouth every 4-6 hours as needed. Each Regular Strength Tylenol pill has 325 mg of acetaminophen. The most you should take each day is 3,250 mg (10 Regular Strength pills a day). IBUPROFEN (E.G., MOTRIN, ADVIL): * Take 400 mg (two 200 mg pills) by mouth every 6 hours as needed. FOR A RUNNY NOSE - BLOW YOUR NOSE: * Nasal mucus and discharge help wash viruses and bacteria out of the nose and sinuses. * Blowing your nose helps clean out your nose. Use a handkerchief or a paper tissue. * If the skin around your nostrils gets irritated, apply a tiny amount of petroleum ointment to the nasal openings once or twice a day. FOR A STUFFY NOSE - USE NASAL WASHES: * Introduction: Saline (salt water) nasal irrigation (nasal wash) is an effective and simple home remedy for treating stuffy nose and sinus congestion. The nose can be irrigated by pouring, spraying, or squirting salt water into the nose and then letting it run back out. COUGHING SPELLS: * Drink warm fluids. Inhale warm mist. (Reason: both relax the airway and loosen up the phlegm) * Suck on cough drops or hard candy to coat the irritated throat. CALL BACK IF: * Fever over 104 F (40 C) * Difficulty breathing occurs * You become worse. CARE ADVICE given per INFLUENZA - SEASONAL (Adult) guideline. * IF OFFICE WILL BE OPEN: You need to be seen within the next 24 hours. Call your doctor when the office opens, and make an appointment. Referrals REFERRED TO PCP OFFICE

## 2016-06-02 NOTE — Telephone Encounter (Signed)
Unable to reach pt by phone.

## 2016-08-05 ENCOUNTER — Ambulatory Visit: Payer: Self-pay

## 2016-08-05 ENCOUNTER — Inpatient Hospital Stay: Admit: 2016-08-05 | Payer: Self-pay

## 2016-08-05 NOTE — Lactation Note (Signed)
This note was copied from a baby's chart. Lactation Consultation Note  Patient Name: Claudia SpurlingJosiah Dean Rogers [id number]'s Date: 08/05/2016     Maternal Data  Mom wants to build her milk supply and wants to EBF baby post lip tie release and frenectomy Class III.   Feeding  "Claudia HoehnJosiah" was able to stay latched for 20mins on the left side and 10mins on the right side with the assistance of a 16mm nipple shield.   LATCH Score/Interventions  Baby was able to eat with clothes on and lips flanged (with nipple shield) with lots of swallows.                     Lactation Tools Discussed/Used  Nipple shield 16mm  Flanges for pump 27mm   Consult Status  Mom is going to keep breastfeeding and keep up with post-op practices.     [name] 08/05/2016, 3:33 PM

## 2016-09-25 ENCOUNTER — Ambulatory Visit (INDEPENDENT_AMBULATORY_CARE_PROVIDER_SITE_OTHER): Payer: BLUE CROSS/BLUE SHIELD | Admitting: Obstetrics and Gynecology

## 2016-09-25 ENCOUNTER — Encounter: Payer: Self-pay | Admitting: Obstetrics and Gynecology

## 2016-09-25 ENCOUNTER — Other Ambulatory Visit: Payer: Self-pay | Admitting: Obstetrics and Gynecology

## 2016-09-25 VITALS — BP 118/78 | HR 98 | Ht 63.0 in | Wt 164.3 lb

## 2016-09-25 DIAGNOSIS — Z01411 Encounter for gynecological examination (general) (routine) with abnormal findings: Secondary | ICD-10-CM | POA: Diagnosis not present

## 2016-09-25 DIAGNOSIS — E041 Nontoxic single thyroid nodule: Secondary | ICD-10-CM | POA: Diagnosis not present

## 2016-09-25 DIAGNOSIS — N912 Amenorrhea, unspecified: Secondary | ICD-10-CM | POA: Diagnosis not present

## 2016-09-25 NOTE — Progress Notes (Signed)
   Subjective:     Claudia Rogers is a [age] y.o. female married, FT homemaker, using condoms regularly; and is here for a comprehensive physical exam. The patient reports no problems.  Social History   Social History  . Marital status: Married    Spouse name: N/A  . Number of children: N/A  . Years of education: N/A   Occupational History  . Not on file.   Social History Main Topics  . Smoking status: Never Smoker  . Smokeless tobacco: Never Used  . Alcohol use 0.0 oz/week     Comment: Social  . Drug use: No  . Sexual activity: Yes    Birth control/ protection: None, Condom   Other Topics Concern  . Not on file   Social History Narrative  . No narrative on file   Health Maintenance  Topic Date Due  . INFLUENZA VACCINE  12/10/2016  . PAP SMEAR  06/21/2018  . TETANUS/TDAP  01/09/2026  . HIV Screening  Completed    The following portions of the patient's history were reviewed and updated as appropriate: allergies, current medications, past family history, past medical history, past social history, past surgical history and problem list.  Review of Systems A comprehensive review of systems was negative.   Objective:    General appearance: alert, cooperative and appears stated age Neck: no adenopathy, no carotid bruit, no JVD, supple, symmetrical, trachea midline and thyroid not enlarged, symmetric, no tenderness/mass/nodules Lungs: clear to auscultation bilaterally Breasts: normal appearance, no masses or tenderness Heart: regular rate and rhythm, S1, S2 normal, no murmur, click, rub or gallop Abdomen: soft, non-tender; bowel sounds normal; no masses,  no organomegaly Pelvic: cervix normal in appearance, external genitalia normal, no adnexal masses or tenderness, no cervical motion tenderness, rectovaginal septum normal, uterus normal size, shape, and consistency and vagina normal without discharge   addendum: two small hard nodules noted on left thyroid Assessment:     Healthy female exam. Lactational amenorrhea, thyroid nodule     Plan:  Labs obtained Ordered Thyroid ultrasound will follow up accordingly RTC 1 year or as needed.  Claudia Rogers Aura CampsShambley, CNM   See After Visit Summary for Counseling Recommendations

## 2016-09-25 NOTE — Patient Instructions (Signed)
Thyroid Nodule A thyroid nodule is an isolatedgrowth of thyroid cells that forms a lump in your thyroid gland. The thyroid gland is a butterfly-shaped gland. It is found in the lower front of your neck. This gland sends chemical messengers (hormones) through your blood to all parts of your body. These hormones are important in regulating your body temperature and helping your body to use energy. Thyroid nodules are common. Most are not cancerous (are benign). You may have one nodule or several nodules. Different types of thyroid nodules include:  Nodules that grow and fill with fluid (thyroid cysts).  Nodules that produce too much thyroid hormone (hot nodules or hyperthyroid).  Nodules that produce no thyroid hormone (cold nodules or hypothyroid).  Nodules that form from cancer cells (thyroid cancers). What are the causes? Usually, the cause of this condition is not known. What increases the risk? Factors that make this condition more likely to develop include:  Increasing age. Thyroid nodules become more common in people who are older than 30 years of age.  Gender.  Benign thyroid nodules are more common in women.  Cancerous (malignant) thyroid nodules are more common in men.  A family history that includes:  Thyroid nodules.  Pheochromocytoma.  Thyroid carcinoma.  Hyperparathyroidism.  Certain kinds of thyroid diseases, such as Hashimoto thyroiditis.  Lack of iodine.  A history of head and neck radiation, such as from X-rays. What are the signs or symptoms? It is common for this condition to cause no symptoms. If you have symptoms, they may include:  A lump in your lower neck.  Feeling a lump or tickle in your throat.  Pain in your neck, jaw, or ear.  Having trouble swallowing. Hot nodules may cause symptoms that include:  Weight loss.  Warm, flushed skin.  Feeling hot.  Feeling nervous.  A racing heartbeat. Cold nodules may cause symptoms that  include:  Weight gain.  Dry skin.  Brittle hair. This may also occur with hair loss.  Feeling cold.  Fatigue. Thyroid cancer nodules may cause symptoms that include:  Hard nodules that feel stuck to the thyroid gland.  Hoarseness.  Lumps in the glands near your thyroid (lymph nodes). How is this diagnosed? A thyroid nodule may be felt by your health care provider during a physical exam. This condition may also be diagnosed based on your symptoms. You may also have tests, including:  An ultrasound. This may be done to confirm the diagnosis.  A biopsy. This involves taking a sample from the nodule and looking at it under a microscope to see if the nodule is benign.  Blood tests to make sure that your thyroid is working properly.  Imaging tests such as MRI or CT scan may be done if:  Your nodule is large.  Your nodule is blocking your airway.  Cancer is suspected. How is this treated? Treatment depends on the cause and size of your nodule or nodules. If the nodule is benign, treatment may not be necessary. Your health care provider may monitor the nodule to see if it goes away without treatment. If the nodule continues to grow, is cancerous, or does not go away:  It may need to be drained with a needle.  It may need to be removed with surgery. If you have surgery, part or all of your thyroid gland may need to be removed as well. Follow these instructions at home:  Pay attention to any changes in your nodule.  Take over-the-counter and prescription medicines only   as told by your health care provider.  Keep all follow-up visits as told by your health care provider. This is important. Contact a health care provider if:  Your voice changes.  You have trouble swallowing.  You have pain in your neck, ear, or jaw that is getting worse.  Your nodule gets bigger.  Your nodule starts to make it harder for you to breathe. Get help right away if:  You have a sudden  fever.  You feel very weak.  Your muscles look like they are shrinking (muscle wasting).  You have mood swings.  You feel very restless.  You feel confused.  You are seeing or hearing things that other people do not see or hear (having hallucinations).  You feel suddenly nauseous or throw up.  You suddenly have diarrhea.  You have chest pain.  There is a loss of consciousness. This information is not intended to replace advice given to you by your health care provider. Make sure you discuss any questions you have with your health care provider. Document Released: 03/21/2004 Document Revised: 12/30/2015 Document Reviewed: 08/09/2014 Elsevier Interactive Patient Education  2017 [employer] Inc[employer]  

## 2016-09-26 LAB — LIPID PANEL
CHOL/HDL RATIO: 3.1 ratio (ref 0.0–4.4)
CHOLESTEROL TOTAL: 177 mg/dL (ref 100–199)
HDL: 57 mg/dL (ref >39)
LDL CALC: 95 mg/dL (ref 0–99)
Triglycerides: 124 mg/dL (ref 0–149)
VLDL CHOLESTEROL CAL: 25 mg/dL (ref 5–40)

## 2016-09-26 LAB — THYROID PANEL WITH TSH
Free Thyroxine Index: 1.5 (ref 1.2–4.9)
T3 Uptake Ratio: 27 % (ref 24–39)
T4 TOTAL: 5.7 ug/dL (ref 4.5–12.0)
TSH: 1.85 u[IU]/mL (ref 0.450–4.500)

## 2016-09-26 LAB — VITAMIN D 25 HYDROXY (VIT D DEFICIENCY, FRACTURES): VIT D 25 HYDROXY: 24.8 ng/mL — AB (ref 30.0–100.0)

## 2016-09-26 LAB — COMPREHENSIVE METABOLIC PANEL
ALK PHOS: 81 IU/L (ref 39–117)
ALT: 12 IU/L (ref 0–32)
AST: 14 IU/L (ref 0–40)
Albumin/Globulin Ratio: 1.8 (ref 1.2–2.2)
Albumin: 4.4 g/dL (ref 3.5–5.5)
BUN/Creatinine Ratio: 19 (ref 9–23)
BUN: 14 mg/dL (ref 6–20)
Bilirubin Total: <0.2 mg/dL (ref 0.0–1.2)
CALCIUM: 9.2 mg/dL (ref 8.7–10.2)
CO2: 24 mmol/L (ref 18–29)
CREATININE: 0.73 mg/dL (ref 0.57–1.00)
Chloride: 102 mmol/L (ref 96–106)
GFR calc Af Amer: 129 mL/min/{1.73_m2} (ref >59)
GFR calc non Af Amer: 112 mL/min/{1.73_m2} (ref >59)
GLOBULIN, TOTAL: 2.5 g/dL (ref 1.5–4.5)
GLUCOSE: 83 mg/dL (ref 65–99)
Potassium: 4.2 mmol/L (ref 3.5–5.2)
SODIUM: 141 mmol/L (ref 134–144)
Total Protein: 6.9 g/dL (ref 6.0–8.5)

## 2016-09-29 LAB — CYTOLOGY - PAP

## 2016-10-07 ENCOUNTER — Telehealth: Payer: Self-pay | Admitting: Obstetrics and Gynecology

## 2016-10-07 NOTE — Telephone Encounter (Signed)
Patient had tyroid lab work and it came back normal - you also ordered an US for the tyroid - she wants to know if she still needs the US since her labs were normal  Please call

## 2016-10-08 NOTE — Telephone Encounter (Signed)
Notified pt. 

## 2016-10-10 ENCOUNTER — Ambulatory Visit
Admission: RE | Admit: 2016-10-10 | Discharge: 2016-10-10 | Disposition: A | Discharge status: Home / Self Care | Payer: BLUE CROSS/BLUE SHIELD | Source: Ambulatory Visit | Attending: Obstetrics and Gynecology | Admitting: Obstetrics and Gynecology

## 2016-10-10 DIAGNOSIS — E041 Nontoxic single thyroid nodule: Secondary | ICD-10-CM | POA: Diagnosis present

## 2016-11-18 ENCOUNTER — Ambulatory Visit (INDEPENDENT_AMBULATORY_CARE_PROVIDER_SITE_OTHER): Payer: BLUE CROSS/BLUE SHIELD | Admitting: Obstetrics and Gynecology

## 2016-11-18 ENCOUNTER — Encounter: Payer: Self-pay | Admitting: Obstetrics and Gynecology

## 2016-11-18 VITALS — BP 117/72 | HR 94 | Ht 63.0 in | Wt 159.8 lb

## 2016-11-18 DIAGNOSIS — F3289 Other specified depressive episodes: Secondary | ICD-10-CM

## 2016-11-18 DIAGNOSIS — F411 Generalized anxiety disorder: Secondary | ICD-10-CM

## 2016-11-18 MED ORDER — ESCITALOPRAM OXALATE 10 MG PO TABS: 10.0000 mg | ORAL_TABLET | Freq: Every day | ORAL | 6 refills | Status: DC

## 2016-11-18 NOTE — Progress Notes (Signed)
Subjective:     Patient ID: Claudia Rogers, female   DOB: 1985-06-29, 31 y.o.   MRN: [id number]  HPI Has felt worsening anxiety and depression since delivery of infant. States she doesn't sleep well or often due to anxiety and infant still not sleeping through the night. Struggles with guilt related to parenting and breastfeeding struggles. Spouse is very supportive. Denies any current suicidal thoughts, but does reports desires to 'not wake up' and feels like that is more related to the fatigue than depression.  Took Prozac in past and didn't tolerate well, mom put her on it in high school till college. Had panic episodes still and suicidal ideation. Weaned off a decade later and felt a little better, will resolution of suicidal thoughts. Unsure of exact onset of anxiety, but remembers always struggling with it as a child, and that it just got worse as she got older. States a 'bad childhood and home environment, strained relationship with her mother' and now that is concerning her as to what type of mother she will be to her son.   She is in regular cognitive/behavioral therapy and feels like it helps.  Doesn't have much of an appetite, feels generalized fatigue daily, stomach pains daily (worse with panic attacks), difficulty falling asleep every night and after nursing middle of the night. Had first menses since delivery 2 weeks ago. Frustrated over no weight loss.  Review of Systems Depression screen [facility] 11/18/2016  Decreased Interest 2  Down, Depressed, Hopeless 2  PHQ - 2 Score 4  Altered sleeping 3  Tired, decreased energy 3  Change in appetite 1  Feeling bad or failure about yourself  3  Trouble concentrating 2  Moving slowly or fidgety/restless 1  Suicidal thoughts 1  PHQ-9 Score 18      Objective:   Physical Exam A&O x4 Well groomed female in mild distress- tearful Blood pressure 117/72, pulse 94, height 5\' 3"  (1.6 m), weight 159 lb 12.8 oz (72.5 kg), currently  breastfeeding. Body mass index is 28.31 kg/m.  Thyroid normal on exam Mood and affect appropriate. Appropriate attachment and interactions with 456 month old infant.    Assessment:     Postpartum anxiety with depression     Plan:     Counseled at length causes of depression and anxiety, especially in first year postpartum. Discussed treatment options and agreed on Lexapro 10mg  dialy. Will continue in cognitive therapy weekly and RTC in 4 weeks for recheck. Sooner if symptoms worsen. Spouse here for entire visit, and is very supportive.  Offered sleep aid, declined at this time, but will consider if needed in future.  >50% of 20 minute visit spent in counseling  Renia Mikelson ChesterShambley, [place]M

## 2016-11-18 NOTE — Patient Instructions (Signed)
Living With Depression Everyone experiences occasional disappointment, sadness, and loss in their lives. When you are feeling down, blue, or sad for at least 2 weeks in a row, it may mean that you have depression. Depression can affect your thoughts and feelings, relationships, daily activities, and physical health. It is caused by changes in the way your brain functions. If you receive a diagnosis of depression, your health care provider will tell you which type of depression you have and what treatment options are available to you. If you are living with depression, there are ways to help you recover from it and also ways to prevent it from coming back. How to cope with lifestyle changes Coping with stress Stress is your body's reaction to life changes and events, both good and bad. Stressful situations may include:  Getting married.  The death of a spouse.  Losing a job.  Retiring.  Having a baby.  Stress can last just a few hours or it can be ongoing. Stress can play a major role in depression, so it is important to learn both how to cope with stress and how to think about it differently. Talk with your health care provider or a counselor if you would like to learn more about stress reduction. He or she may suggest some stress reduction techniques, such as:  Music therapy. This can include creating music or listening to music. Choose music that you enjoy and that inspires you.  Mindfulness-based meditation. This kind of meditation can be done while sitting or walking. It involves being aware of your normal breaths, rather than trying to control your breathing.  Centering prayer. This is a kind of meditation that involves focusing on a spiritual word or phrase. Choose a word, phrase, or sacred image that is meaningful to you and that brings you peace.  Deep breathing. To do this, expand your stomach and inhale slowly through your nose. Hold your breath for 3-5 seconds, then exhale  slowly, allowing your stomach muscles to relax.  Muscle relaxation. This involves intentionally tensing muscles then relaxing them.  Choose a stress reduction technique that fits your lifestyle and personality. Stress reduction techniques take time and practice to develop. Set aside 5-15 minutes a day to do them. Therapists can offer training in these techniques. The training may be covered by some insurance plans. Other things you can do to manage stress include:  Keeping a stress diary. This can help you learn what triggers your stress and ways to control your response.  Understanding what your limits are and saying no to requests or events that lead to a schedule that is too full.  Thinking about how you respond to certain situations. You may not be able to control everything, but you can control how you react.  Adding humor to your life by watching funny films or TV shows.  Making time for activities that help you relax and not feeling guilty about spending your time this way.  Medicines Your health care provider may suggest certain medicines if he or she feels that they will help improve your condition. Avoid using alcohol and other substances that may prevent your medicines from working properly (may interact). It is also important to:  Talk with your pharmacist or health care provider about all the medicines that you take, their possible side effects, and what medicines are safe to take together.  Make it your goal to take part in all treatment decisions (shared decision-making). This includes giving input on the side   effects of medicines. It is best if shared decision-making with your health care provider is part of your total treatment plan.  If your health care provider prescribes a medicine, you may not notice the full benefits of it for 4-8 weeks. Most people who are treated for depression need to be on medicine for at least 6-12 months after they feel better. If you are taking  medicines as part of your treatment, do not stop taking medicines without first talking to your health care provider. You may need to have the medicine slowly decreased (tapered) over time to decrease the risk of harmful side effects. Relationships Your health care provider may suggest family therapy along with individual therapy and drug therapy. While there may not be family problems that are causing you to feel depressed, it is still important to make sure your family learns as much as they can about your mental health. Having your family's support can help make your treatment successful. How to recognize changes in your condition Everyone has a different response to treatment for depression. Recovery from major depression happens when you have not had signs of major depression for two months. This may mean that you will start to:  Have more interest in doing activities.  Feel less hopeless than you did 2 months ago.  Have more energy.  Overeat less often, or have better or improving appetite.  Have better concentration.  Your health care provider will work with you to decide the next steps in your recovery. It is also important to recognize when your condition is getting worse. Watch for these signs:  Having fatigue or low energy.  Eating too much or too little.  Sleeping too much or too little.  Feeling restless, agitated, or hopeless.  Having trouble concentrating or making decisions.  Having unexplained physical complaints.  Feeling irritable, angry, or aggressive.  Get help as soon as you or your family members notice these symptoms coming back. How to get support and help from others How to talk with friends and family members about your condition Talking to friends and family members about your condition can provide you with one way to get support and guidance. Reach out to trusted friends or family members, explain your symptoms to them, and let them know that you are  working with a health care provider to treat your depression. Financial resources Not all insurance plans cover mental health care, so it is important to check with your insurance carrier. If paying for co-pays or counseling services is a problem, search for a local or county mental health care center. They may be able to offer public mental health care services at low or no cost when you are not able to see a private health care provider. If you are taking medicine for depression, you may be able to get the generic form, which may be less expensive. Some makers of prescription medicines also offer help to patients who cannot afford the medicines they need. Follow these instructions at home:  Get the right amount and quality of sleep.  Cut down on using caffeine, tobacco, alcohol, and other potentially harmful substances.  Try to exercise, such as walking or lifting small weights.  Take over-the-counter and prescription medicines only as told by your health care provider.  Eat a healthy diet that includes plenty of vegetables, fruits, whole grains, low-fat dairy products, and lean protein. Do not eat a lot of foods that are high in solid fats, added sugars, or salt.    Keep all follow-up visits as told by your health care provider. This is important. Contact a health care provider if:  You stop taking your antidepressant medicines, and you have any of these symptoms: ? Nausea. ? Headache. ? Feeling lightheaded. ? Chills and body aches. ? Not being able to sleep (insomnia).  You or your friends and family think your depression is getting worse. Get help right away if:  You have thoughts of hurting yourself or others. If you ever feel like you may hurt yourself or others, or have thoughts about taking your own life, get help right away. You can go to your nearest emergency department or call:  Your local emergency services (911 in the [place]  A suicide crisis helpline, such as the  National Suicide Prevention Lifeline at 1-800-273-8255. This is open 24-hours a day.  Summary  If you are living with depression, there are ways to help you recover from it and also ways to prevent it from coming back.  Work with your health care team to create a management plan that includes counseling, stress management techniques, and healthy lifestyle habits. This information is not intended to replace advice given to you by your health care provider. Make sure you discuss any questions you have with your health care provider. Document Released: 03/31/2016 Document Revised: 03/31/2016 Document Reviewed: 03/31/2016 Elsevier Interactive Patient Education  2018 Elsevier Inc.  

## 2016-12-16 ENCOUNTER — Encounter: Payer: Self-pay | Admitting: Obstetrics and Gynecology

## 2016-12-16 ENCOUNTER — Ambulatory Visit (INDEPENDENT_AMBULATORY_CARE_PROVIDER_SITE_OTHER): Payer: BLUE CROSS/BLUE SHIELD | Admitting: Obstetrics and Gynecology

## 2016-12-16 VITALS — BP 115/69 | HR 86 | Ht 63.0 in | Wt 156.0 lb

## 2016-12-16 DIAGNOSIS — Z79899 Other long term (current) drug therapy: Secondary | ICD-10-CM | POA: Diagnosis not present

## 2016-12-16 NOTE — Progress Notes (Signed)
Subjective:     Patient ID: Claudia Rogers, female   DOB: 09/16/1985, 31 y.o.   MRN: [id number]  HPI Here for medication follow up for anxiety and depression postpartum. Reports feeling 50-60 % better, and is starting to do things socially again. States infant is starting to sleep through the night and that is helping. Spouse also reports she seems much better and less depressed.  Does not nausea about 2-3 hours after taking medication(takes in the morning with breakfast) and also has had diarrhea each afternoon for last week.   Review of Systems Negative except stated in HPI  Dep[facility] 2[facility] 12/16/2016 11/18/2016  Decreased Interest 0 2  Down, Depressed, Hopeless 1 2  PHQ - 2 Score 1 4  Altered sleeping 1 3  Tired, decreased energy 1 3  Change in appetite 0 1  Feeling bad or failure about yourself  0 3  Trouble concentrating 1 2  Moving slowly or fidgety/restless 0 1  Suicidal thoughts 0 1  PHQ-9 Score 4 18      Objective:   Physical Exam A&Ox4 Well groomed female in no distress Blood pressure 115/69, pulse 86, height 5\' 3"  (1.6 m), weight 156 lb (70.8 kg), currently breastfeeding. PE not indicated    Assessment:     Depression and anxiety improved on SSRI    Plan:     Instructed to switch Lexapro to bedtime with small meal. To add immodium as needed.Will send me a message in 4-6 weeks on MyChart as update, sooner if needed.  RTC as needed.  >50% of 10 minute visit spent in counseling.  Melody KossuthShambley, CNM

## 2017-01-19 ENCOUNTER — Encounter: Payer: Self-pay | Admitting: Obstetrics and Gynecology

## 2017-06-20 ENCOUNTER — Other Ambulatory Visit: Payer: Self-pay | Admitting: Obstetrics and Gynecology

## 2017-06-29 ENCOUNTER — Encounter: Payer: Self-pay | Admitting: Obstetrics and Gynecology

## 2017-08-14 ENCOUNTER — Encounter: Payer: Self-pay | Admitting: Obstetrics and Gynecology

## 2017-08-14 ENCOUNTER — Ambulatory Visit: Payer: BLUE CROSS/BLUE SHIELD | Admitting: Obstetrics and Gynecology

## 2017-08-14 VITALS — BP 114/69 | HR 81 | Ht 63.0 in | Wt 168.2 lb

## 2017-08-14 DIAGNOSIS — Z79899 Other long term (current) drug therapy: Secondary | ICD-10-CM

## 2017-08-14 DIAGNOSIS — Z862 Personal history of diseases of the blood and blood-forming organs and certain disorders involving the immune mechanism: Secondary | ICD-10-CM | POA: Diagnosis not present

## 2017-08-14 DIAGNOSIS — R5383 Other fatigue: Secondary | ICD-10-CM

## 2017-08-14 DIAGNOSIS — E559 Vitamin D deficiency, unspecified: Secondary | ICD-10-CM | POA: Diagnosis not present

## 2017-08-14 NOTE — Patient Instructions (Addendum)
To wean off lexapro with 5mg  x 2 weeks, then 5mg  every other day for 2 weeks, then every 3rd day x 2 weeks then stop.

## 2017-08-14 NOTE — Progress Notes (Signed)
Subjective:     Patient ID: Martyn Ehrichiffany R Laughner, female   DOB: October 28, 1985, 32 y.o.   MRN: [id number]  HPI Here to discuss Lexapro use. Stopped Breastfeeding in December and menses back to 29 days and hormonally feeling well. Wants to stop lexapro now. Still taking vit D as it helped.  Planning another pregnancy in another year. Still losing hair and feels really tired. Not sleeping well.  Also started Floridix three days ago to see if it helps. Is anemic typically.  Review of Systems Negative except stated above    Objective:   Physical Exam A&Ox4 Well groomed female in no distress Blood pressure 114/69, pulse 81, height 5\' 3"  (1.6 m), weight 168 lb 3.2 oz (76.3 kg), last menstrual period 07/16/2017, not currently breastfeeding. Thyroid normal on exam     Assessment:     Vitamin d deficiency H/o anemia Fatigue Sleep disturbances.     Plan:     Labs obtained, will follow up accordingly. To continue vit D and floridex To wean off lexapro with 5mg  x 2 weeks, then 5mg  qod for 2 weeks, then every 3rd day x 2 weeks then stop.  RTC when annual due or sooner if needed.  Joon Pohle,CNM

## 2017-08-15 LAB — CBC
Hematocrit: 38.8 % (ref 34.0–46.6)
Hemoglobin: 12.7 g/dL (ref 11.1–15.9)
MCH: 30.7 pg (ref 26.6–33.0)
MCHC: 32.7 g/dL (ref 31.5–35.7)
MCV: 94 fL (ref 79–97)
PLATELETS: 140 10*3/uL — AB (ref 150–379)
RBC: 4.14 x10E6/uL (ref 3.77–5.28)
RDW: 12.1 % — AB (ref 12.3–15.4)
WBC: 7.3 10*3/uL (ref 3.4–10.8)

## 2017-08-15 LAB — COMPREHENSIVE METABOLIC PANEL
ALK PHOS: 48 IU/L (ref 39–117)
ALT: 9 IU/L (ref 0–32)
AST: 14 IU/L (ref 0–40)
Albumin/Globulin Ratio: 2 (ref 1.2–2.2)
Albumin: 4.3 g/dL (ref 3.5–5.5)
BUN/Creatinine Ratio: 26 — ABNORMAL HIGH (ref 9–23)
BUN: 19 mg/dL (ref 6–20)
Bilirubin Total: 0.3 mg/dL (ref 0.0–1.2)
CALCIUM: 9 mg/dL (ref 8.7–10.2)
CO2: 21 mmol/L (ref 20–29)
Chloride: 105 mmol/L (ref 96–106)
Creatinine, Ser: 0.72 mg/dL (ref 0.57–1.00)
GFR calc Af Amer: 130 mL/min/{1.73_m2} (ref >59)
GFR, EST NON AFRICAN AMERICAN: 113 mL/min/{1.73_m2} (ref >59)
GLUCOSE: 88 mg/dL (ref 65–99)
Globulin, Total: 2.1 g/dL (ref 1.5–4.5)
Potassium: 4.3 mmol/L (ref 3.5–5.2)
SODIUM: 140 mmol/L (ref 134–144)
Total Protein: 6.4 g/dL (ref 6.0–8.5)

## 2017-08-15 LAB — FERRITIN: FERRITIN: 30 ng/mL (ref 15–150)

## 2017-08-15 LAB — VITAMIN D 25 HYDROXY (VIT D DEFICIENCY, FRACTURES): Vit D, 25-Hydroxy: 40 ng/mL (ref 30.0–100.0)

## 2017-08-15 LAB — B12 AND FOLATE PANEL: VITAMIN B 12: 500 pg/mL (ref 232–1245)

## 2017-08-15 LAB — THYROID PANEL WITH TSH
FREE THYROXINE INDEX: 1.4 (ref 1.2–4.9)
T3 Uptake Ratio: 25 % (ref 24–39)
T4 TOTAL: 5.7 ug/dL (ref 4.5–12.0)
TSH: 1.39 u[IU]/mL (ref 0.450–4.500)

## 2017-09-30 ENCOUNTER — Ambulatory Visit (INDEPENDENT_AMBULATORY_CARE_PROVIDER_SITE_OTHER): Payer: BLUE CROSS/BLUE SHIELD | Admitting: Obstetrics and Gynecology

## 2017-09-30 ENCOUNTER — Encounter: Payer: Self-pay | Admitting: Obstetrics and Gynecology

## 2017-09-30 VITALS — BP 104/75 | HR 90 | Ht 63.0 in | Wt 172.8 lb

## 2017-09-30 DIAGNOSIS — Z01419 Encounter for gynecological examination (general) (routine) without abnormal findings: Secondary | ICD-10-CM

## 2017-09-30 NOTE — Progress Notes (Signed)
Subjective:   Claudia Rogers is a 32 y.o. G69P0010 Caucasian female here for a routine well-woman exam.  Patient's last menstrual period was 09/14/2017.    Current complaints: seasonal allergies- zyrtec and flonase help. Reports doing well weaning off lexapro except with menses. Very moody with panic attack during last menses.  PCP: me       Doesn't need labs  Social History: Sexual: heterosexual Marital Status: married Living situation: with family Occupation: homemaker Tobacco/alcohol: no tobacco use Illicit drugs: no history of illicit drug use Is exercising regularly.  The following portions of the patient's history were reviewed and updated as appropriate: allergies, current medications, past family history, past medical history, past social history, past surgical history and problem list.  Past Medical History Past Medical History:  Diagnosis Date  . Anxiety   . H/O viral meningitis   . History of chicken pox     Past Surgical History Past Surgical History:  Procedure Laterality Date  . EYE MUSCLE SURGERY Bilateral 1996  . EYE MUSCLE SURGERY Bilateral 2000  . Odontoma Removal/implant  2003  . WISDOM TOOTH EXTRACTION      Gynecologic History G2P0010  Patient's last menstrual period was 09/14/2017. Contraception: condoms Last Pap: 2018. Results were: normal   Obstetric History OB History  Gravida Para Term Preterm AB Living  2       1    SAB TAB Ectopic Multiple Live Births  1            # Outcome Date GA Lbr Len/2nd Weight Sex Delivery Anes PTL Lv  2 Gravida           1 SAB 07/20/14        FD    Current Medications Current Outpatient Medications on File Prior to Visit  Medication Sig Dispense Refill  . cetirizine (ZYRTEC) 10 MG tablet Take 10 mg by mouth daily.    Marland Kitchen escitalopram (LEXAPRO) 10 MG tablet TAKE 1 TABLET BY MOUTH EVERY DAY 30 tablet 6   No current facility-administered medications on file prior to visit.     Review of Systems Patient  denies any headaches, blurred vision, shortness of breath, chest pain, abdominal pain, problems with bowel movements, urination, or intercourse.  Objective:  BP 104/75   Pulse 90   Ht  (1.6 m)   Wt 172 lb 12.8 oz (78.4 kg)   LMP 09/14/2017   BMI 30.61 kg/m  Physical Exam  General:  Well developed, well nourished, no acute distress. She is alert and oriented x3. Skin:  Warm and dry Neck:  Midline trachea, no thyromegaly or nodules Cardiovascular: Regular rate and rhythm, no murmur heard Lungs:  Effort normal, all lung fields clear to auscultation bilaterally Breasts:  No dominant palpable mass, retraction, or nipple discharge Abdomen:  Soft, non tender, no hepatosplenomegaly or masses Pelvic:  External genitalia is normal in appearance.  The vagina is normal in appearance. The cervix is bulbous, no CMT.  Thin prep pap is not done . Uterus is felt to be normal size, shape, and contour.  No adnexal masses or tenderness noted. Extremities:  No swelling or varicosities noted Psych:  She has a normal mood and affect  Assessment:   Healthy well-woman exam PPD-weaning off lexapro. PMS  Plan:  Discussed using lexapro for PMS only. Will consider. Plans to wait another year for pregancy. F/U 1 year for AE, or sooner if needed   Kristiane Morsch Suzan Nailer, CNM

## 2017-09-30 NOTE — Patient Instructions (Signed)
Preventive Care 18-39 Years, Female Preventive care refers to lifestyle choices and visits with your health care provider that can promote health and wellness. What does preventive care include?  A yearly physical exam. This is also called an annual well check.  Dental exams once or twice a year.  Routine eye exams. Ask your health care provider how often you should have your eyes checked.  Personal lifestyle choices, including: ? Daily care of your teeth and gums. ? Regular physical activity. ? Eating a healthy diet. ? Avoiding tobacco and drug use. ? Limiting alcohol use. ? Practicing safe sex. ? Taking vitamin and mineral supplements as recommended by your health care provider. What happens during an annual well check? The services and screenings done by your health care provider during your annual well check will depend on your age, overall health, lifestyle risk factors, and family history of disease. Counseling Your health care provider may ask you questions about your:  Alcohol use.  Tobacco use.  Drug use.  Emotional well-being.  Home and relationship well-being.  Sexual activity.  Eating habits.  Work and work Statistician.  Method of birth control.  Menstrual cycle.  Pregnancy history.  Screening You may have the following tests or measurements:  Height, weight, and BMI.  Diabetes screening. This is done by checking your blood sugar (glucose) after you have not eaten for a while (fasting).  Blood pressure.  Lipid and cholesterol levels. These may be checked every 5 years starting at age 38.  Skin check.  Hepatitis C blood test.  Hepatitis B blood test.  Sexually transmitted disease (STD) testing.  BRCA-related cancer screening. This may be done if you have a family history of breast, ovarian, tubal, or peritoneal cancers.  Pelvic exam and Pap test. This may be done every 3 years starting at age 38. Starting at age 30, this may be done  every 5 years if you have a Pap test in combination with an HPV test.  Discuss your test results, treatment options, and if necessary, the need for more tests with your health care provider. Vaccines Your health care provider may recommend certain vaccines, such as:  Influenza vaccine. This is recommended every year.  Tetanus, diphtheria, and acellular pertussis (Tdap, Td) vaccine. You may need a Td booster every 10 years.  Varicella vaccine. You may need this if you have not been vaccinated.  HPV vaccine. If you are 39 or younger, you may need three doses over 6 months.  Measles, mumps, and rubella (MMR) vaccine. You may need at least one dose of MMR. You may also need a second dose.  Pneumococcal 13-valent conjugate (PCV13) vaccine. You may need this if you have certain conditions and were not previously vaccinated.  Pneumococcal polysaccharide (PPSV23) vaccine. You may need one or two doses if you smoke cigarettes or if you have certain conditions.  Meningococcal vaccine. One dose is recommended if you are age 68-21 years and a first-year college student living in a residence hall, or if you have one of several medical conditions. You may also need additional booster doses.  Hepatitis A vaccine. You may need this if you have certain conditions or if you travel or work in places where you may be exposed to hepatitis A.  Hepatitis B vaccine. You may need this if you have certain conditions or if you travel or work in places where you may be exposed to hepatitis B.  Haemophilus influenzae type b (Hib) vaccine. You may need this  if you have certain risk factors.  Talk to your health care provider about which screenings and vaccines you need and how often you need them. This information is not intended to replace advice given to you by your health care provider. Make sure you discuss any questions you have with your health care provider. Document Released: 06/24/2001 Document Revised:  01/16/2016 Document Reviewed: 02/27/2015 Elsevier Interactive Patient Education  2018 Elsevier Inc.  

## 2017-10-21 ENCOUNTER — Encounter: Payer: Self-pay | Admitting: Obstetrics and Gynecology

## 2017-12-23 ENCOUNTER — Other Ambulatory Visit: Payer: Self-pay | Admitting: Obstetrics and Gynecology

## 2017-12-23 MED ORDER — NORETHIN ACE-ETH ESTRAD-FE 1-20 MG-MCG(24) PO CAPS: 1.0000 | ORAL_CAPSULE | Freq: Every day | ORAL | 6 refills | Status: DC

## 2018-01-04 ENCOUNTER — Other Ambulatory Visit: Payer: Self-pay | Admitting: *Deleted

## 2018-01-04 MED ORDER — NORETHIN ACE-ETH ESTRAD-FE 1-20 MG-MCG(24) PO CAPS: 1.0000 | ORAL_CAPSULE | Freq: Every day | ORAL | 6 refills | Status: DC

## 2018-02-21 ENCOUNTER — Other Ambulatory Visit: Payer: Self-pay | Admitting: Obstetrics and Gynecology

## 2018-05-21 ENCOUNTER — Other Ambulatory Visit: Payer: PRIVATE HEALTH INSURANCE

## 2018-05-21 DIAGNOSIS — N912 Amenorrhea, unspecified: Secondary | ICD-10-CM

## 2018-05-22 LAB — BETA HCG QUANT (REF LAB): hCG Quant: <1 m[IU]/mL

## 2018-05-25 ENCOUNTER — Other Ambulatory Visit: Payer: Self-pay | Admitting: Obstetrics and Gynecology

## 2018-05-26 ENCOUNTER — Other Ambulatory Visit: Payer: Self-pay | Admitting: Obstetrics and Gynecology

## 2018-05-26 ENCOUNTER — Ambulatory Visit (INDEPENDENT_AMBULATORY_CARE_PROVIDER_SITE_OTHER): Payer: PRIVATE HEALTH INSURANCE

## 2018-05-26 DIAGNOSIS — R102 Pelvic and perineal pain: Secondary | ICD-10-CM | POA: Diagnosis not present

## 2018-05-26 DIAGNOSIS — N926 Irregular menstruation, unspecified: Secondary | ICD-10-CM

## 2018-05-26 DIAGNOSIS — N83202 Unspecified ovarian cyst, left side: Secondary | ICD-10-CM | POA: Diagnosis not present

## 2018-05-27 ENCOUNTER — Encounter: Payer: Self-pay | Admitting: Obstetrics and Gynecology

## 2018-05-27 ENCOUNTER — Ambulatory Visit (INDEPENDENT_AMBULATORY_CARE_PROVIDER_SITE_OTHER): Payer: PRIVATE HEALTH INSURANCE | Admitting: Obstetrics and Gynecology

## 2018-05-27 VITALS — BP 104/52 | HR 75 | Ht 63.0 in | Wt 176.0 lb

## 2018-05-27 DIAGNOSIS — N83202 Unspecified ovarian cyst, left side: Secondary | ICD-10-CM | POA: Diagnosis not present

## 2018-05-27 NOTE — Patient Instructions (Signed)
Ovarian Cyst         An ovarian cyst is a fluid-filled sac that forms on an ovary. The ovaries are small organs that produce eggs in women. Various types of cysts can form on the ovaries. Some may cause symptoms and require treatment. Most ovarian cysts go away on their own, are not cancerous (are benign), and do not cause problems.  Common types of ovarian cysts include:  · Functional (follicle) cysts.  ? Occur during the menstrual cycle, and usually go away with the next menstrual cycle if you do not get pregnant.  ? Usually cause no symptoms.  · Endometriomas.  ? Are cysts that form from the tissue that lines the uterus (endometrium).  ? Are sometimes called “chocolate cysts” because they become filled with blood that turns brown.  ? Can cause pain in the lower abdomen during intercourse and during your period.  · Cystadenoma cysts.  ? Develop from cells on the outside surface of the ovary.  ? Can get very large and cause lower abdomen pain and pain with intercourse.  ? Can cause severe pain if they twist or break open (rupture).  · Dermoid cysts.  ? Are sometimes found in both ovaries.  ? May contain different kinds of body tissue, such as skin, teeth, hair, or cartilage.  ? Usually do not cause symptoms unless they get very big.  · Theca lutein cysts.  ? Occur when too much of a certain hormone (human chorionic gonadotropin) is produced and overstimulates the ovaries to produce an egg.  ? Are most common after having procedures used to assist with the conception of a baby (in vitro fertilization).  What are the causes?  Ovarian cysts may be caused by:  · Ovarian hyperstimulation syndrome. This is a condition that can develop from taking fertility medicines. It causes multiple large ovarian cysts to form.  · Polycystic ovarian syndrome (PCOS). This is a common hormonal disorder that can cause ovarian cysts, as well as problems with your period or fertility.  What increases the risk?  The following factors may  make you more likely to develop ovarian cysts:  · Being overweight or obese.  · Taking fertility medicines.  · Taking certain forms of hormonal birth control.  · Smoking.  What are the signs or symptoms?  Many ovarian cysts do not cause symptoms. If symptoms are present, they may include:  · Pelvic pain or pressure.  · Pain in the lower abdomen.  · Pain during sex.  · Abdominal swelling.  · Abnormal menstrual periods.  · Increasing pain with menstrual periods.  How is this diagnosed?  These cysts are commonly found during a routine pelvic exam. You may have tests to find out more about the cyst, such as:  · Ultrasound.  · X-ray of the pelvis.  · CT scan.  · MRI.  · Blood tests.  How is this treated?  Many ovarian cysts go away on their own without treatment. Your health care provider may want to check your cyst regularly for 2-3 months to see if it changes. If you are in menopause, it is especially important to have your cyst monitored closely because menopausal women have a higher rate of ovarian cancer.  When treatment is needed, it may include:  · Medicines to help relieve pain.  · A procedure to drain the cyst (aspiration).  · Surgery to remove the whole cyst.  · Hormone treatment or birth control pills. These methods are sometimes used   to help dissolve a cyst.  Follow these instructions at home:  · Take over-the-counter and prescription medicines only as told by your health care provider.  · Do not drive or use heavy machinery while taking prescription pain medicine.  · Get regular pelvic exams and Pap tests as often as told by your health care provider.  · Return to your normal activities as told by your health care provider. Ask your health care provider what activities are safe for you.  · Do not use any products that contain nicotine or tobacco, such as cigarettes and e-cigarettes. If you need help quitting, ask your health care provider.  · Keep all follow-up visits as told by your health care provider.  This is important.  Contact a health care provider if:  · Your periods are late, irregular, or painful, or they stop.  · You have pelvic pain that does not go away.  · You have pressure on your bladder or trouble emptying your bladder completely.  · You have pain during sex.  · You have any of the following in your abdomen:  ? A feeling of fullness.  ? Pressure.  ? Discomfort.  ? Pain that does not go away.  ? Swelling.  · You feel generally ill.  · You become constipated.  · You lose your appetite.  · You develop severe acne.  · You start to have more body hair and facial hair.  · You are gaining weight or losing weight without changing your exercise and eating habits.  · You think you may be pregnant.  Get help right away if:  · You have abdominal pain that is severe or gets worse.  · You cannot eat or drink without vomiting.  · You suddenly develop a fever.  · Your menstrual period is much heavier than usual.  This information is not intended to replace advice given to you by your health care provider. Make sure you discuss any questions you have with your health care provider.  Document Released: 04/28/2005 Document Revised: 11/16/2015 Document Reviewed: 09/30/2015  Elsevier Interactive Patient Education © 2019 [employer] Inc.

## 2018-05-27 NOTE — Progress Notes (Signed)
Here to review the following ultrasound:   Date of Service: 05/26/2018     Indications:Pelvic Pain Findings:  The uterus is anteverted and measures 7.6x45.5cm. Echo texture is homogenous without evidence of focal masses.  The Endometrium measures 9.2 mm.  Right Ovary measures 5.1 cm. It is normal in appearance. Left Ovary measures 11.1 cm. It has two simple cysts = 16.5x16.25mm (preovulatory follicle vs CL) and 8x56mm (follicle ) Survey of the adnexa demonstrates no adnexal masses. There is no free fluid in the cul de sac.  Impression: 1. Left Ovary measures 11.1 cm. It has two simple cysts = 16.5x16.4mm (preovulatory follicle vs CL) and 8x31mm (follicle )   Assessment: left ovarian cyst, missed menses  Plan: counseled on findings. Was trying for pregnancy but desires waiting until the summer to try again, will restart OCPs and desires LoLoEstrin as it worked well in the past. 2 sample packs given to start now.   RTC as needed.  Fawne Hughley,CNM

## 2018-06-09 ENCOUNTER — Other Ambulatory Visit: Payer: PRIVATE HEALTH INSURANCE

## 2018-06-09 ENCOUNTER — Encounter: Payer: PRIVATE HEALTH INSURANCE | Admitting: Obstetrics and Gynecology

## 2018-07-09 ENCOUNTER — Other Ambulatory Visit: Payer: Self-pay | Admitting: Obstetrics and Gynecology

## 2018-07-09 MED ORDER — ESCITALOPRAM OXALATE 10 MG PO TABS: 10.0000 mg | ORAL_TABLET | Freq: Every day | ORAL | 6 refills | Status: DC

## 2018-10-07 ENCOUNTER — Encounter: Payer: BLUE CROSS/BLUE SHIELD | Admitting: Obstetrics and Gynecology

## 2018-12-14 ENCOUNTER — Other Ambulatory Visit: Payer: Self-pay

## 2018-12-14 ENCOUNTER — Encounter: Payer: Self-pay | Admitting: Obstetrics and Gynecology

## 2018-12-14 ENCOUNTER — Ambulatory Visit: Payer: PRIVATE HEALTH INSURANCE | Admitting: Obstetrics and Gynecology

## 2018-12-14 VITALS — BP 111/61 | HR 73 | Ht 63.0 in | Wt 171.5 lb

## 2018-12-14 DIAGNOSIS — Z3169 Encounter for other general counseling and advice on procreation: Secondary | ICD-10-CM

## 2018-12-14 DIAGNOSIS — F329 Major depressive disorder, single episode, unspecified: Secondary | ICD-10-CM

## 2018-12-14 DIAGNOSIS — R300 Dysuria: Secondary | ICD-10-CM

## 2018-12-14 DIAGNOSIS — F32A Depression, unspecified: Secondary | ICD-10-CM

## 2018-12-14 DIAGNOSIS — Z01419 Encounter for gynecological examination (general) (routine) without abnormal findings: Secondary | ICD-10-CM

## 2018-12-14 DIAGNOSIS — F419 Anxiety disorder, unspecified: Secondary | ICD-10-CM

## 2018-12-14 NOTE — Progress Notes (Unsigned)
SUBJECTIVE:  33 y.o. caucasian female G2P002for annual routine Pap and checkup.  Sexual: heterosexual Marital Status: married Living situation: with family Occupation: homemaker Tobacco/alcohol: no tobacco use Illicit drugs: no history of illicit drug use Is exercising regularly.  Currently taking Lexapro 10mg  PO daily for depression and anxiety. Discussed possible increase to 20mg  daily. Pt will consider increasing dose as she reports still feeling like she has depressed episodes intermittently.   Currently trying for pregnancy since beginning of April 2020. Reports regular cycles, tracking with app. Took up to 9 months to conceive previous pregnancy. 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   Concerned for possible UTI with urinary urgency and frequency. Denies pain with urination. S/s started yesterday. Reports generalized abdominal cramping in between menses. Denies abnormal bleeding.    Current Outpatient Medications  Medication Sig Dispense Refill  . escitalopram (LEXAPRO) 10 MG tablet Take 1 tablet (10 mg total) by mouth daily. 30 tablet 6  . cetirizine (ZYRTEC) 10 MG tablet Take 10 mg by mouth daily.    . Norethin Ace-Eth Estrad-FE (TAYTULLA) 1-20 MG-MCG(24) CAPS Take 1 tablet by mouth daily. 28 capsule 6   No current facility-administered medications for this visit.    Allergies: Patient has no known allergies.  No LMP recorded. (Menstrual status: Other).  ROS:  Feeling well. No dyspnea or chest pain on exertion.  No abdominal pain, change in bowel habits, black or bloody stools.  No urinary tract symptoms. GYN ROS: normal menses, no abnormal bleeding, pelvic pain or discharge, no breast pain or new or enlarging lumps on self exam. No neurological complaints.  OBJECTIVE:  The patient appears well, alert, oriented x 3, in no distress. BP 111/61   Pulse 73   Ht 5\' 3"  (1.6 m)   Wt 77.8 kg   BMI  30.38 kg/m  ENT normal.  Neck supple. No adenopathy or thyromegaly. PERLA. Lungs are clear, good air entry, no wheezes, rhonchi or rales. S1 and S2 normal, no murmurs, regular rate and rhythm. Abdomen soft without tenderness, guarding, mass or organomegaly. Extremities show no edema, normal peripheral pulses. Neurological is normal, no focal findings.  BREAST EXAM: breasts appear normal, no suspicious masses, no skin or nipple changes or axillary nodes  PELVIC EXAM: normal external genitalia, vulva, vagina, cervix, uterus and adnexa. Bimanual exam performed.   ASSESSMENT:  well woman Depression Anxiety Preconception counseling Urinary frequency  PLAN:  Return in 1 yr or sooner if needed UA sent to lab  Continue Lexapro 10mg  and consider increase to 20mg  Will notify CNM when + UPT for progesterone  Silvestre Mesi, SNM

## 2018-12-16 LAB — URINE CULTURE

## 2019-02-08 ENCOUNTER — Other Ambulatory Visit: Payer: Self-pay | Admitting: Obstetrics and Gynecology

## 2019-02-08 MED ORDER — ESCITALOPRAM OXALATE 20 MG PO TABS: 20.0000 mg | ORAL_TABLET | Freq: Every day | ORAL | 6 refills | Status: DC

## 2019-04-04 ENCOUNTER — Telehealth: Payer: Self-pay | Admitting: Certified Nurse Midwife

## 2019-04-04 NOTE — Telephone Encounter (Signed)
Pt states hx low progesterone & miscarriage. Pt was told by MS to come immediately at positive pregnancy test for progesterone level.  LMP 03/01/19.  Confirmation appt given tomorrow 04/05/19 due to hx.  Please confirm and advise.

## 2019-04-05 ENCOUNTER — Ambulatory Visit (INDEPENDENT_AMBULATORY_CARE_PROVIDER_SITE_OTHER): Payer: PRIVATE HEALTH INSURANCE | Admitting: Certified Nurse Midwife

## 2019-04-05 ENCOUNTER — Other Ambulatory Visit: Payer: Self-pay

## 2019-04-05 ENCOUNTER — Telehealth: Payer: Self-pay

## 2019-04-05 ENCOUNTER — Other Ambulatory Visit: Payer: Self-pay | Admitting: Certified Nurse Midwife

## 2019-04-05 ENCOUNTER — Other Ambulatory Visit: Payer: PRIVATE HEALTH INSURANCE

## 2019-04-05 ENCOUNTER — Encounter: Payer: Self-pay | Admitting: Certified Nurse Midwife

## 2019-04-05 VITALS — BP 99/61 | HR 88 | Ht 63.0 in | Wt 166.3 lb

## 2019-04-05 DIAGNOSIS — N912 Amenorrhea, unspecified: Secondary | ICD-10-CM

## 2019-04-05 DIAGNOSIS — Z3201 Encounter for pregnancy test, result positive: Secondary | ICD-10-CM | POA: Diagnosis not present

## 2019-04-05 LAB — POCT URINE PREGNANCY: Preg Test, Ur: POSITIVE — AB

## 2019-04-05 MED ORDER — PROGESTERONE 200 MG VA SUPP: 200.0000 mg | Freq: Every day | VAGINAL | 2 refills | Status: DC

## 2019-04-05 NOTE — Telephone Encounter (Signed)
Pt states [facility] is not available at [facility] [place].   Needs it sent to another pharmacy. ty  CM

## 2019-04-05 NOTE — Telephone Encounter (Signed)
Can we find out what pharmacy she wants it to go to? She may need to call around and find out who has it.   Thanks  Deneise Lever

## 2019-04-05 NOTE — Patient Instructions (Signed)

## 2019-04-05 NOTE — Progress Notes (Signed)
Subjective:    Claudia Rogers is a 33 y.o. female who presents for evaluation of amenorrhea. She believes she could be pregnant. Pregnancy is desired. Sexual Activity: single partner, contraception: none. Current symptoms also include: back pain . Last period was normal.   No LMP recorded. (Menstrual status: Other). The following portions of the patient's history were reviewed and updated as appropriate: allergies, current medications, past family history, past medical history, past social history, past surgical history and problem list.  Review of Systems Pertinent items are noted in HPI.     Objective:    There were no vitals taken for this visit. General: alert, cooperative, appears stated age, mild distress and no acute distress    Lab Review Urine HCG: positive    Assessment:    Absence of menstruation.     Plan:  Positive: EDC: 12/06/19. Briefly discussed pre-natal care options. Discussed midwifey or MD care. PT request Midwives. Started on progesterone. Pt has history of loss with low progesterone level. Discussed that it was used during last pregnancy and request to do again. . Encouraged well-balanced diet, plenty of rest when needed, pre-natal vitamins daily and walking for exercise. Discussed self-help for nausea, avoiding OTC medications until consulting provider or pharmacist, other than Tylenol as needed, minimal caffeine (1-2 cups daily) and avoiding alcohol. She will schedule her u/s for dating 1-2 wks, her nurse visit @ 10 wks and her nitial NOB physical exam @ 12-[redacted] wks pregnant.  Feel free to call with any questions.    Philip Aspen, CNM

## 2019-04-05 NOTE — Telephone Encounter (Signed)
Voicemail message left for patient- any particular pharmacy for the progesterone?

## 2019-04-06 LAB — PROGESTERONE: Progesterone: 15.9 ng/mL

## 2019-04-17 IMAGING — US US THYROID
1 series · 14 of 25 positions shown · non-contrast
Comparison: None.

CLINICAL DATA: Other.  Thyroid nodule.

EXAM:
THYROID ULTRASOUND
TECHNIQUE: Ultrasound examination of the thyroid gland and adjacent soft
tissues was performed.

[Series 1: us thyroid · 0.06mm/px · 14 of 50 slices shown]
[im 1/50]
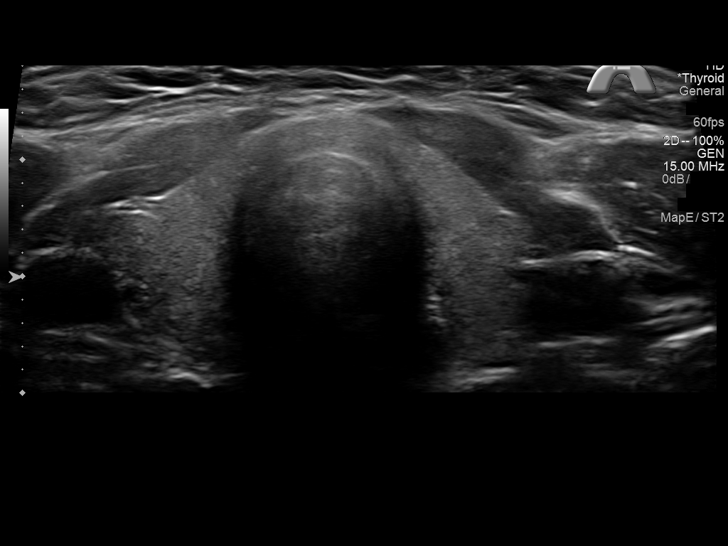
[im 5/50]
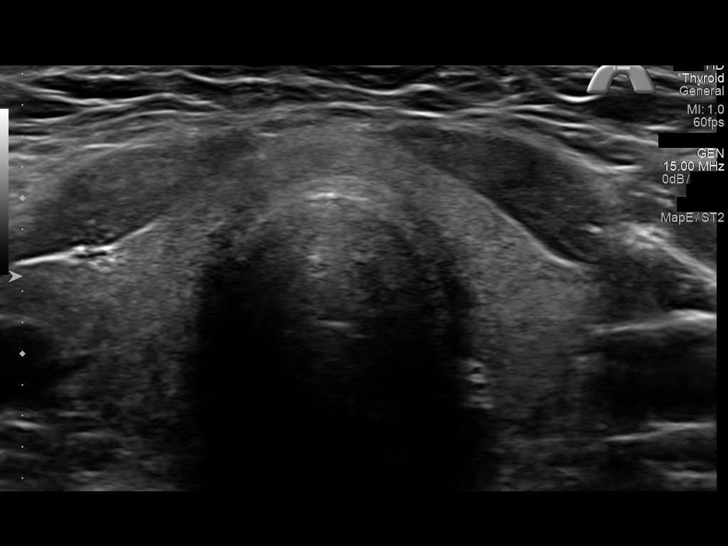
[im 9/50]
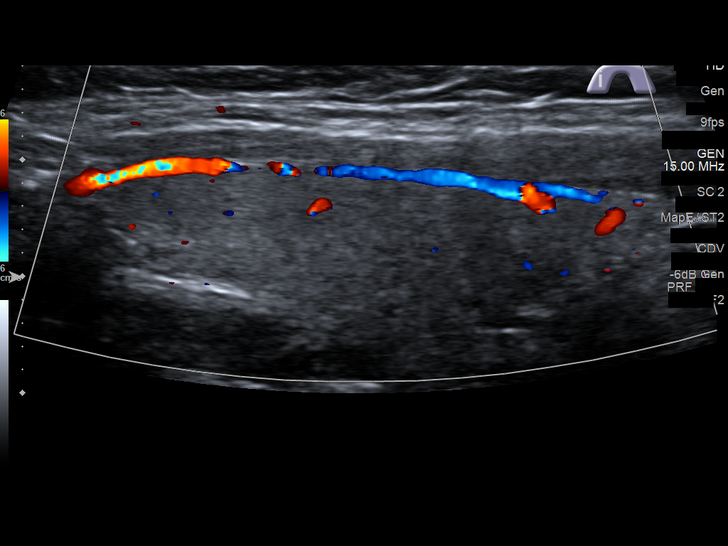
[im 13/50]
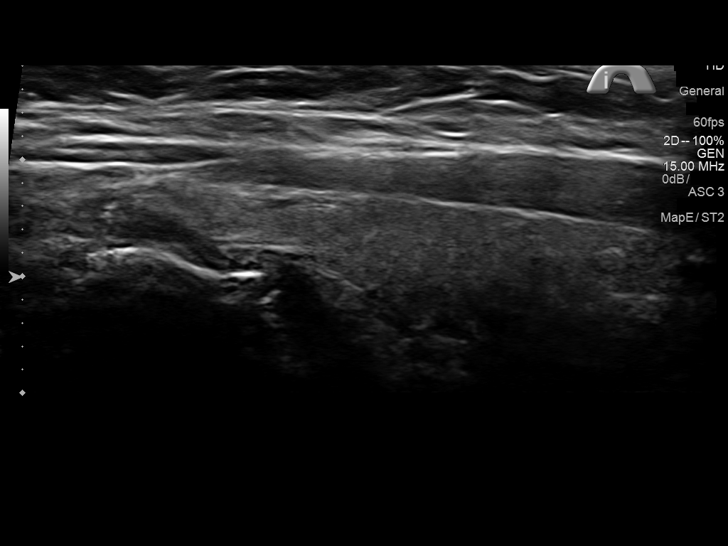
[im 17/50]
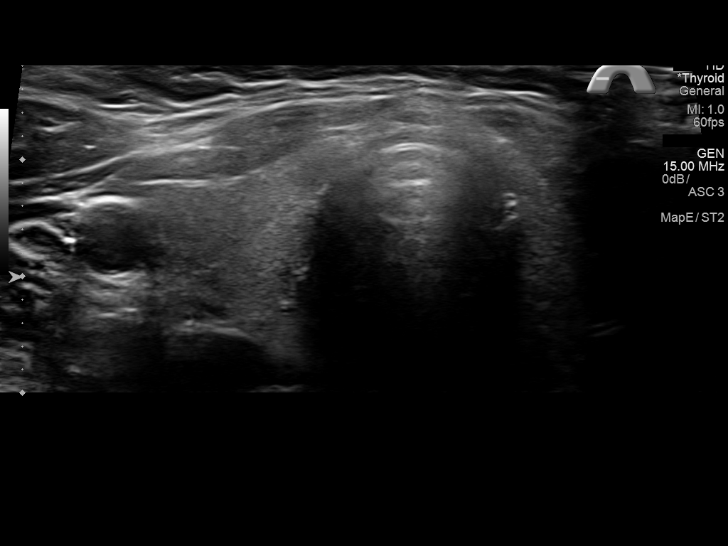
[im 19/50]
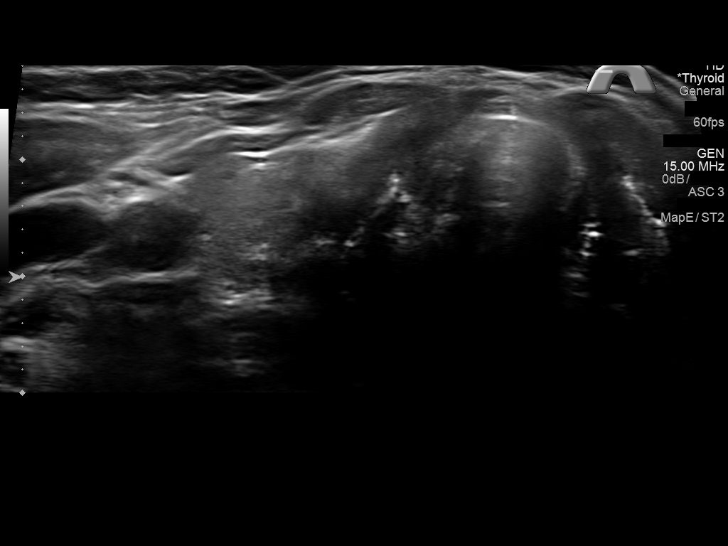
[im 23/50]
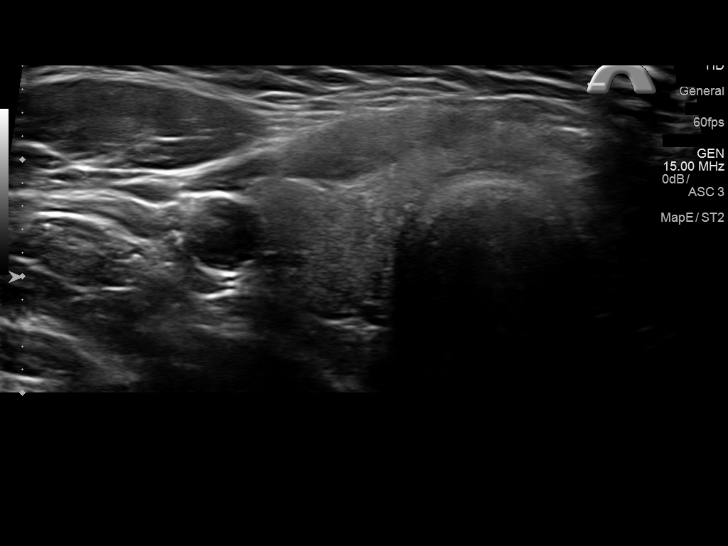
[im 27/50]
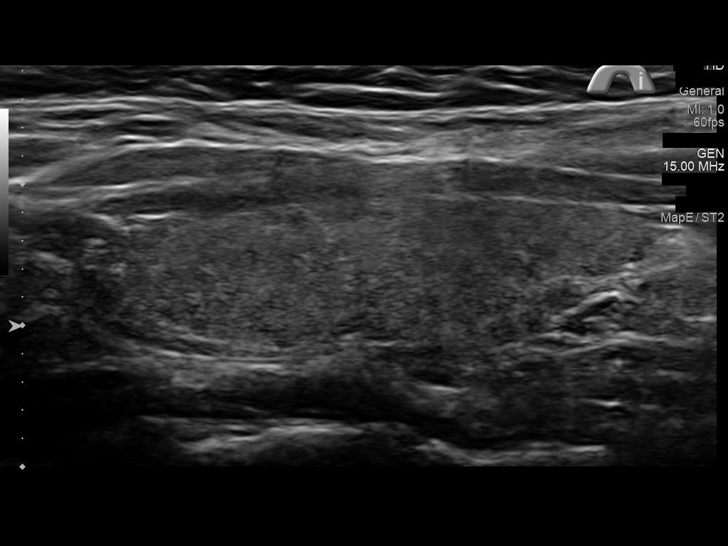
[im 31/50]
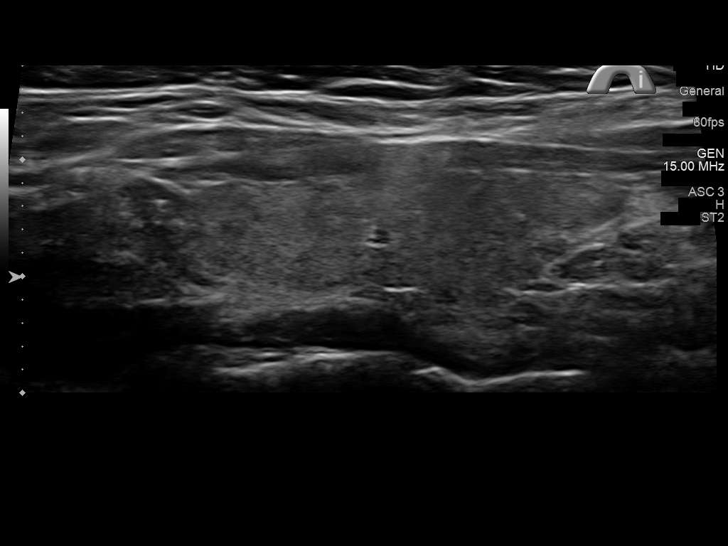
[im 33/50]
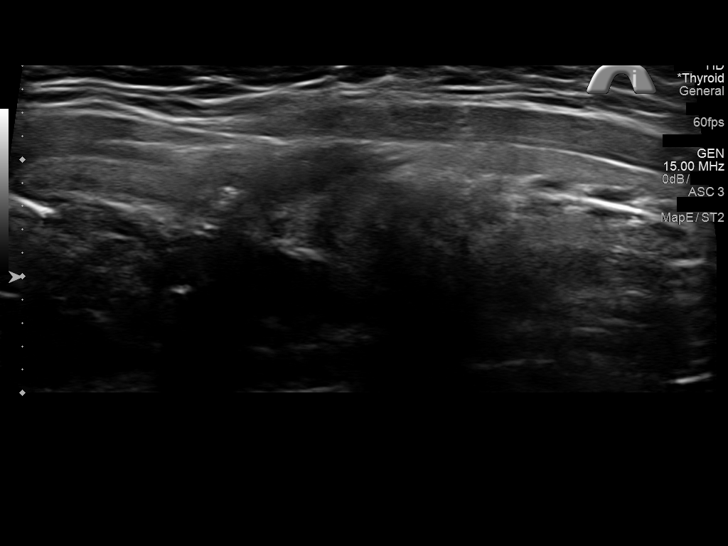
[im 37/50]
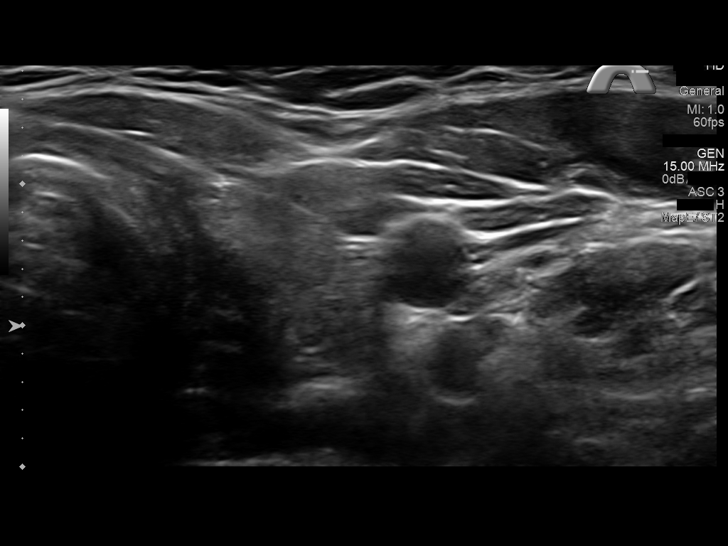
[im 41/50]
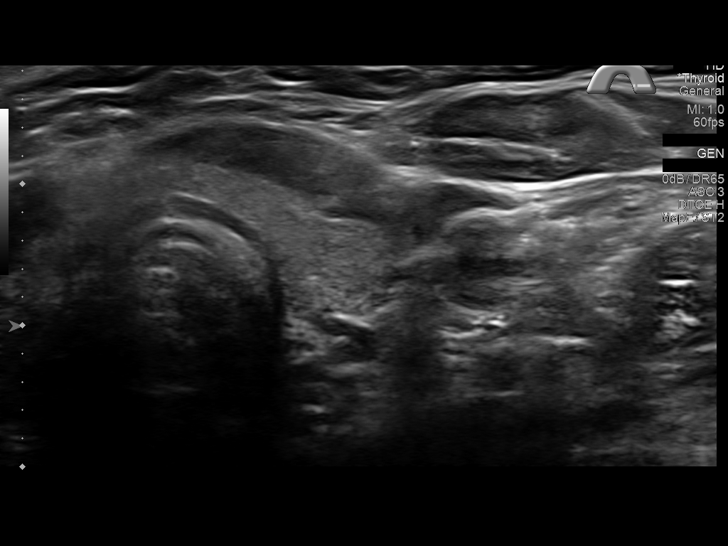
[im 45/50]
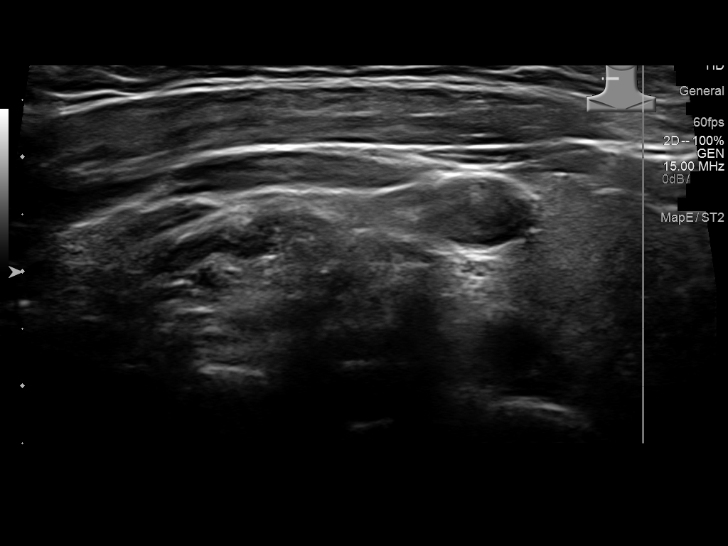
[im 50/50]
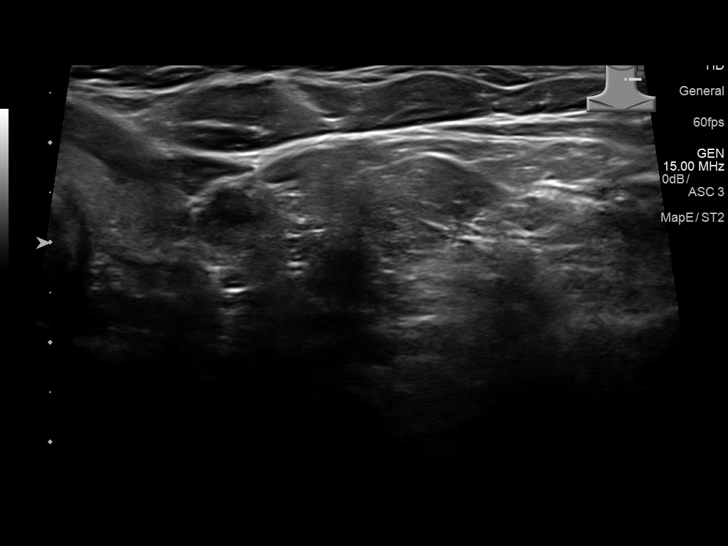

[14 of 25 positions shown; findings below may reference images not displayed]

FINDINGS: Parenchymal Echotexture: Normal

Isthmus: Normal in size measuring 0.3 cm in diameter

Right lobe: Normal in size measuring 5.0 x 1.2 x 1.5 cm

Left lobe: Normal in size measuring 3.9 x 1.0 x 1.2 cm.

_________________________________________________________

Estimated total number of nodules >/= 1 cm: 0

Number of spongiform nodules >/=  2 cm not described below (TR1): 0

Number of mixed cystic and solid nodules >/= 1.5 cm not described
below (TR2): 0

_________________________________________________________

No discrete nodules are seen within the thyroid gland.
IMPRESSION: Normal thyroid gland without evidence of thyromegaly or discrete
thyroid nodule or mass.

## 2019-04-19 ENCOUNTER — Ambulatory Visit (INDEPENDENT_AMBULATORY_CARE_PROVIDER_SITE_OTHER): Payer: PRIVATE HEALTH INSURANCE

## 2019-04-19 ENCOUNTER — Other Ambulatory Visit: Payer: Self-pay

## 2019-04-19 ENCOUNTER — Other Ambulatory Visit: Payer: Self-pay | Admitting: Certified Nurse Midwife

## 2019-04-19 DIAGNOSIS — N8312 Corpus luteum cyst of left ovary: Secondary | ICD-10-CM | POA: Diagnosis not present

## 2019-04-19 DIAGNOSIS — O3481 Maternal care for other abnormalities of pelvic organs, first trimester: Secondary | ICD-10-CM | POA: Diagnosis not present

## 2019-04-19 DIAGNOSIS — N912 Amenorrhea, unspecified: Secondary | ICD-10-CM

## 2019-04-19 DIAGNOSIS — Z3A01 Less than 8 weeks gestation of pregnancy: Secondary | ICD-10-CM

## 2019-05-01 ENCOUNTER — Other Ambulatory Visit: Payer: Self-pay

## 2019-05-01 ENCOUNTER — Inpatient Hospital Stay (HOSPITAL_COMMUNITY): Payer: PRIVATE HEALTH INSURANCE

## 2019-05-01 ENCOUNTER — Inpatient Hospital Stay (HOSPITAL_COMMUNITY)
Admission: AD | Admit: 2019-05-01 | Discharge: 2019-05-01 | Disposition: A | Discharge status: Home / Self Care | Payer: PRIVATE HEALTH INSURANCE | Attending: Obstetrics and Gynecology | Admitting: Obstetrics and Gynecology

## 2019-05-01 ENCOUNTER — Encounter (HOSPITAL_COMMUNITY): Payer: Self-pay | Admitting: Obstetrics and Gynecology

## 2019-05-01 DIAGNOSIS — O99341 Other mental disorders complicating pregnancy, first trimester: Secondary | ICD-10-CM | POA: Diagnosis not present

## 2019-05-01 DIAGNOSIS — Z79899 Other long term (current) drug therapy: Secondary | ICD-10-CM | POA: Insufficient documentation

## 2019-05-01 DIAGNOSIS — Z3A01 Less than 8 weeks gestation of pregnancy: Secondary | ICD-10-CM | POA: Diagnosis not present

## 2019-05-01 DIAGNOSIS — F419 Anxiety disorder, unspecified: Secondary | ICD-10-CM | POA: Insufficient documentation

## 2019-05-01 DIAGNOSIS — O039 Complete or unspecified spontaneous abortion without complication: Secondary | ICD-10-CM | POA: Insufficient documentation

## 2019-05-01 DIAGNOSIS — O209 Hemorrhage in early pregnancy, unspecified: Secondary | ICD-10-CM

## 2019-05-01 LAB — URINALYSIS, ROUTINE W REFLEX MICROSCOPIC
Bacteria, UA: NONE SEEN
Bilirubin Urine: NEGATIVE
Glucose, UA: NEGATIVE mg/dL
Ketones, ur: NEGATIVE mg/dL
Nitrite: NEGATIVE
Protein, ur: 100 mg/dL — AB
RBC / HPF: >50 RBC/hpf — ABNORMAL HIGH (ref 0–5)
Specific Gravity, Urine: 1.031 — ABNORMAL HIGH (ref 1.005–1.030)
pH: 6 (ref 5.0–8.0)

## 2019-05-01 LAB — CBC
HCT: 34.1 % — ABNORMAL LOW (ref 36.0–46.0)
Hemoglobin: 11.9 g/dL — ABNORMAL LOW (ref 12.0–15.0)
MCH: 31.9 pg (ref 26.0–34.0)
MCHC: 34.9 g/dL (ref 30.0–36.0)
MCV: 91.4 fL (ref 80.0–100.0)
Platelets: 333 10*3/uL (ref 150–400)
RBC: 3.73 MIL/uL — ABNORMAL LOW (ref 3.87–5.11)
RDW: 11.1 % — ABNORMAL LOW (ref 11.5–15.5)
WBC: 9.6 10*3/uL (ref 4.0–10.5)
nRBC: 0 % (ref 0.0–0.2)

## 2019-05-01 LAB — HCG, QUANTITATIVE, PREGNANCY: hCG, Beta Chain, Quant, S: 4508 m[IU]/mL — ABNORMAL HIGH (ref <5)

## 2019-05-01 MED ORDER — IBUPROFEN 600 MG PO TABS: 600.0000 mg | ORAL_TABLET | Freq: Four times a day (QID) | ORAL | 1 refills | Status: DC | PRN

## 2019-05-01 MED ORDER — TRAMADOL HCL 50 MG PO TABS
100.0000 mg | ORAL_TABLET | Freq: Once | ORAL | Status: AC
  Administered 2019-05-01: 100 mg via ORAL
  Filled 2019-05-01: qty 2

## 2019-05-01 MED ORDER — TRAMADOL HCL 50 MG PO TABS: 50.0000 mg | ORAL_TABLET | Freq: Four times a day (QID) | ORAL | 0 refills | Status: DC | PRN

## 2019-05-01 NOTE — MAU Note (Signed)
Pt reports she started spotting and cramping yesterday.  Pt reports she passed a "sac" around 0700 this morning.  Pt rating abd pain 5/10.  Reports she took tylenol around 0500.

## 2019-05-01 NOTE — MAU Provider Note (Signed)
Chief Complaint: Vaginal Bleeding and Abdominal Pain   First Provider Initiated Contact with Patient 05/01/19 [phone]      SUBJECTIVE HPI: Claudia Rogers is a 33 y.o. G3P1011 at [redacted]w[redacted]d by early [place] who presents to maternity admissions reporting vaginal bleeding, abdominal pain, and passing tissue at home.  She reports back pain with this pregnancy x 2-3 weeks.  With hx of miscarriage x 1, she is doing vaginal progesterone suppositories daily. She reports an increase in back pain and onset of spotting yesterday. Then at 3 am, she went to the bathroom and saw bright red bleeding when wiping.  After this episode of bleeding, she passed a small sac of fluid with tissue inside with minimal bleeding and brought this in to MAU today.  Currently there is mild cramping abdominal and back pain and minimal bleeding.  There are no other symptoms. She has not tried any treatments.    HPI  Past Medical History:  Diagnosis Date  . Anxiety   . H/O viral meningitis   . History of chicken pox    Past Surgical History:  Procedure Laterality Date  . EYE MUSCLE SURGERY Bilateral 1996  . EYE MUSCLE SURGERY Bilateral 2000  . Odontoma Removal/implant  2003  . WISDOM TOOTH EXTRACTION     Social History   Socioeconomic History  . Marital status: Married    Spouse name: Not on file  . Number of children: Not on file  . Years of education: Not on file  . Highest education level: Not on file  Occupational History  . Not on file  Tobacco Use  . Smoking status: Never Smoker  . Smokeless tobacco: Never Used  Substance and Sexual Activity  . Alcohol use: Not Currently    Alcohol/week: 0.0 standard drinks    Comment: Social  . Drug use: No  . Sexual activity: Yes    Birth control/protection: None  Other Topics Concern  . Not on file  Social History Narrative  . Not on file   Social Determinants of Health   Financial Resource Strain:   . Difficulty of Paying Living Expenses: Not on file  Food  Insecurity:   . Worried About Programme researcher, broadcasting/film/video in the Last Year: Not on file  . Ran Out of Food in the Last Year: Not on file  Transportation Needs:   . Lack of Transportation (Medical): Not on file  . Lack of Transportation (Non-Medical): Not on file  Physical Activity:   . Days of Exercise per Week: Not on file  . Minutes of Exercise per Session: Not on file  Stress:   . Feeling of Stress : Not on file  Social Connections:   . Frequency of Communication with Friends and Family: Not on file  . Frequency of Social Gatherings with Friends and Family: Not on file  . Attends Religious Services: Not on file  . Active Member of Clubs or Organizations: Not on file  . Attends Banker Meetings: Not on file  . Marital Status: Not on file  Intimate Partner Violence:   . Fear of Current or Ex-Partner: Not on file  . Emotionally Abused: Not on file  . Physically Abused: Not on file  . Sexually Abused: Not on file   No current facility-administered medications on file prior to encounter.   Current Outpatient Medications on File Prior to Encounter  Medication Sig Dispense Refill  . Bacillus Coagulans-Inulin (PROBIOTIC) 1-250 BILLION-MG CAPS     . cetirizine (ZYRTEC)  10 MG tablet Take 10 mg by mouth daily.    Marland Kitchen. escitalopram (LEXAPRO) 20 MG tablet Take 1 tablet (20 mg total) by mouth daily. 30 tablet 6  . Loratadine (CLARITIN) 10 MG CAPS     . Prenatal Vit-Fe Fumarate-FA (PRENATAL VITAMIN) 27-0.8 MG TABS Prenatal Vitamin     No Known Allergies  ROS:  Review of Systems  Constitutional: Negative for chills, fatigue and fever.  Respiratory: Negative for shortness of breath.   Cardiovascular: Negative for chest pain.  Gastrointestinal: Positive for abdominal pain. Negative for nausea and vomiting.  Genitourinary: Positive for vaginal bleeding. Negative for difficulty urinating, dysuria, flank pain, pelvic pain, vaginal discharge and vaginal pain.  Musculoskeletal: Positive for  back pain.  Neurological: Negative for dizziness and headaches.  Psychiatric/Behavioral: Negative.      I have reviewed patient's Past Medical Hx, Surgical Hx, Family Hx, Social Hx, medications and allergies.   Physical Exam   Patient Vitals for the past 24 hrs:  BP Temp Pulse Resp SpO2  05/01/19 1159 119/60 -- -- 18 --  05/01/19 0909 113/67 98.7 F (37.1 C) 87 18 100 %   Constitutional: Well-developed, well-nourished female in no acute distress.  Cardiovascular: normal rate Respiratory: normal effort GI: Abd soft, non-tender. Pos BS x 4 MS: Extremities nontender, no edema, normal ROM Neurologic: Alert and oriented x 4.  GU: Neg CVAT.  PELVIC EXAM: Deferred   LAB RESULTS Results for orders placed or performed during the hospital encounter of 05/01/19 (from the past 24 hour(s))  Urinalysis, Routine w reflex microscopic     Status: Abnormal   Collection Time: 05/01/19  9:13 AM  Result Value Ref Range   Color, Urine YELLOW YELLOW   APPearance CLOUDY (A) CLEAR   Specific Gravity, Urine 1.031 (H) 1.005 - 1.030   pH 6.0 5.0 - 8.0   Glucose, UA NEGATIVE NEGATIVE mg/dL   Hgb urine dipstick LARGE (A) NEGATIVE   Bilirubin Urine NEGATIVE NEGATIVE   Ketones, ur NEGATIVE NEGATIVE mg/dL   Protein, ur 161100 (A) NEGATIVE mg/dL   Nitrite NEGATIVE NEGATIVE   Leukocytes,Ua TRACE (A) NEGATIVE   RBC / HPF >50 (H) 0 - 5 RBC/hpf   Bacteria, UA NONE SEEN NONE SEEN   Squamous Epithelial / LPF 21-50 0 - 5   Mucus PRESENT   CBC     Status: Abnormal   Collection Time: 05/01/19  9:51 AM  Result Value Ref Range   WBC 9.6 4.0 - 10.5 K/uL   RBC 3.73 (L) 3.87 - 5.11 MIL/uL   Hemoglobin 11.9 (L) 12.0 - 15.0 g/dL   HCT 09.634.1 (L) 04.536.0 - 40.946.0 %   MCV 91.4 80.0 - 100.0 fL   MCH 31.9 26.0 - 34.0 pg   MCHC 34.9 30.0 - 36.0 g/dL   RDW 81.111.1 (L) 91.411.5 - 78.215.5 %   Platelets 333 150 - 400 K/uL   nRBC 0.0 0.0 - 0.2 %  hCG, quantitative, pregnancy     Status: Abnormal   Collection Time: 05/01/19  9:51 AM   Result Value Ref Range   hCG, Beta Chain, Quant, S 4,508 (H) <5 mIU/mL       IMAGING US OB Comp Less 14 Wks  Result Date: 05/01/2019 CLINICAL DATA:  33 year old pregnant female with 1st trimester vaginal bleeding. EXAM: OBSTETRIC <14 WK ULTRASOUND TECHNIQUE: Transabdominal ultrasound was performed for evaluation of the gestation as well as the maternal uterus and adnexal regions. COMPARISON:  04/19/2019 FINDINGS: Intrauterine gestational sac: None Yolk sac:  Not Visualized. Embryo:  Not Visualized. Subchorionic hemorrhage:  None visualized. Maternal uterus/adnexae: The ovaries bilaterally are unremarkable. IMPRESSION: No evidence of intrauterine pregnancy. The IUP identified on 04/19/2019 is no longer visualized, compatible with 1st trimester pregnancy failure. Electronically Signed   By: Margarette Canada M.D.   On: 05/01/2019 11:17   US OB Transvaginal  Result Date: 04/21/2019 Patient Name: KAARI ZEIGLER DOB: [birth year] MRN: [id number] ULTRASOUND REPORT Location: [facility] Date of Service: 04/19/2019 Indications:dating Findings: Nelda Marseille intrauterine pregnancy is visualized with a CRL consistent with [redacted]w[redacted]d gestation, giving an (U/S) EDD of 12/13/2019.. FHR: 115 BPM CRL measurement: 4.3 mm Yolk sac is visualized and appears normal and early anatomy is normal. Amnion: visualized and appears normal Right Ovary is normal in appearance. Left Ovary is normal appearance. Corpus luteal cyst:  Left ovary Survey of the adnexa demonstrates no adnexal masses. There is no free peritoneal fluid in the cul de sac. Impression: 1. [redacted]w[redacted]d Viable Singleton Intrauterine pregnancy by U/S. 2. (U/S) EDD is 12/13/2019.Marland Kitchen Recommendations: 1.Clinical correlation with the patient's History and Physical Exam. Jenine M. Albertine Grates     RDMS The ultrasound images and findings were reviewed by me and I agree with the above report. Finis Bud, M.D. 04/21/2019 2:09 PM   MAU Management/MDM: Orders Placed This Encounter   Procedures  . US OB Comp Less 14 Wks  . Urinalysis, Routine w reflex microscopic  . CBC  . hCG, quantitative, pregnancy  . Discharge patient    Meds ordered this encounter  Medications  . traMADol (ULTRAM) tablet 100 mg  . ibuprofen (ADVIL) 600 MG tablet    Sig: Take 1 tablet (600 mg total) by mouth every 6 (six) hours as needed.    Dispense:  30 tablet    Refill:  1    Order Specific Question:   Supervising Provider    Answer:   Aletha Halim [id number]  . traMADol (ULTRAM) 50 MG tablet    Sig: Take 1 tablet (50 mg total) by mouth every 6 (six) hours as needed.    Dispense:  10 tablet    Refill:  0    Order Specific Question:   Supervising Provider    Answer:   Aletha Halim [[id number]]    US shows previous IUP is not present, definitive for SAB.  Bleeding minimal and pain mild.  Rx for ibuprofen and Tramadol 50 mg Q 6 hours x 10 tabs.  F/U in office as scheduled in 1 week. Return to MAU as needed for emergencies.    ASSESSMENT 1. Complete miscarriage   2. Vaginal bleeding in pregnancy, first trimester     PLAN Discharge home Allergies as of 05/01/2019   No Known Allergies     Medication List    STOP taking these medications   Magnesium Carbonate Powd   Norethin Ace-Eth Estrad-FE 1-20 MG-MCG(24) Caps Commonly known as: Taytulla   progesterone 200 MG Supp     TAKE these medications   cetirizine 10 MG tablet Commonly known as: ZYRTEC Take 10 mg by mouth daily.   Claritin 10 MG Caps Generic drug: Loratadine   escitalopram 20 MG tablet Commonly known as: LEXAPRO Take 1 tablet (20 mg total) by mouth daily.   ibuprofen 600 MG tablet Commonly known as: ADVIL Take 1 tablet (600 mg total) by mouth every 6 (six) hours as needed.   Prenatal Vitamin 27-0.8 MG Tabs Prenatal Vitamin   Probiotic 1-250 BILLION-MG Caps   traMADol 50 MG tablet Commonly known as: Veatrice Bourbon  Take 1 tablet (50 mg total) by mouth every 6 (six) hours as needed.      Follow-up  Information    Your OB provider in [place] Follow up in 1 week(s).   Why: Return to MAU as needed for heavy bleeding or abdominal pain.        [facility] [employer] [facility]. Schedule an appointment as soon as possible for a visit.   Specialty: Obstetrics and Gynecology Contact information: [address] [place] [address] [phone]          Sharen Counter Certified Nurse-Midwife 05/01/2019  7:35 PM

## 2019-05-02 ENCOUNTER — Other Ambulatory Visit: Payer: Self-pay

## 2019-05-02 ENCOUNTER — Telehealth: Payer: Self-pay | Admitting: Certified Nurse Midwife

## 2019-05-02 MED ORDER — ESCITALOPRAM OXALATE 20 MG PO TABS: 20.0000 mg | ORAL_TABLET | Freq: Every day | ORAL | 6 refills | Status: DC

## 2019-05-04 ENCOUNTER — Telehealth: Payer: Self-pay | Admitting: Certified Nurse Midwife

## 2019-05-04 ENCOUNTER — Telehealth: Payer: Self-pay

## 2019-05-04 NOTE — Telephone Encounter (Signed)
mychart message sent to patient

## 2019-05-04 NOTE — Telephone Encounter (Signed)
Pt called in pt is having a lot of pain lower right side sharpe pain. Pt stated she is using heating pad, pt requesting a call back just for a peace of mind. Please advise

## 2019-05-09 ENCOUNTER — Other Ambulatory Visit: Payer: PRIVATE HEALTH INSURANCE

## 2019-05-09 ENCOUNTER — Other Ambulatory Visit: Payer: Self-pay

## 2019-05-09 DIAGNOSIS — O039 Complete or unspecified spontaneous abortion without complication: Secondary | ICD-10-CM

## 2019-05-10 ENCOUNTER — Other Ambulatory Visit: Payer: Self-pay | Admitting: Certified Nurse Midwife

## 2019-05-10 DIAGNOSIS — N912 Amenorrhea, unspecified: Secondary | ICD-10-CM

## 2019-05-10 LAB — BETA HCG QUANT (REF LAB): hCG Quant: 36 m[IU]/mL

## 2019-05-10 NOTE — Progress Notes (Signed)
orders placed for repeat hcg.   Philip Aspen, CNM

## 2019-05-13 NOTE — L&D Delivery Note (Signed)
Delivery Note Claudia Rogers is a 34 y.o. [id number] at [redacted]w[redacted]d admitted for IOL d/t A1DM.   GBS Status: Negative/-- (08/31 0851) Maximum Maternal Temperature: 98.5  Labor course: Initial SVE: 4/70/-2. Augmentation with: AROM, Pitocin and OP Foley. She then progressed to complete.  ROM: 3h 68m with clear fluid  Birth: At 0037 a viable female was delivered via spontaneous vaginal delivery (Presentation: LOA). Nuchal cord present: No.  Shoulders and body delivered in usual fashion. Infant placed directly on mom's abdomen for bonding/skin-to-skin, baby dried and stimulated. Cord clamped x 2 after 1 minute and cut by FOB.  Cord blood collected.  The placenta separated spontaneously and delivered via gentle cord traction.  Pitocin infused rapidly IV per protocol.  Fundus firm with massage.  Placenta inspected and appears to be intact with a 3 VC.  Placenta/Cord with the following complications: none .  Cord pH: not done Sponge and instrument count were correct x2.  Intrapartum complications:  None Anesthesia:  epidural Episiotomy: none Lacerations:  2nd degree Suture Repair: 3.0 vicryl EBL (mL): 100   Infant: APGAR (1 MIN): 7   APGAR (5 MINS): 8   APGAR (10 MINS):    Infant weight: pending  Mom to postpartum.  Baby to Couplet care / Skin to Skin. Placenta to L&D   Plans to Breastfeed Contraception: condoms Circumcision: wants inpatient  Note sent to [facility]: [place] for pp visit.  Cheral Marker CNM, [facility] 02/01/2020 12:55 AM

## 2019-05-16 ENCOUNTER — Other Ambulatory Visit: Payer: PRIVATE HEALTH INSURANCE

## 2019-05-16 ENCOUNTER — Other Ambulatory Visit: Payer: Self-pay

## 2019-05-16 DIAGNOSIS — N912 Amenorrhea, unspecified: Secondary | ICD-10-CM

## 2019-05-17 LAB — BETA HCG QUANT (REF LAB): hCG Quant: 4 m[IU]/mL

## 2019-05-20 ENCOUNTER — Encounter: Payer: PRIVATE HEALTH INSURANCE | Admitting: Certified Nurse Midwife

## 2019-05-23 NOTE — Telephone Encounter (Signed)
Error

## 2019-05-27 ENCOUNTER — Other Ambulatory Visit: Payer: Self-pay

## 2019-05-27 ENCOUNTER — Other Ambulatory Visit: Payer: PRIVATE HEALTH INSURANCE

## 2019-05-27 DIAGNOSIS — N912 Amenorrhea, unspecified: Secondary | ICD-10-CM

## 2019-05-28 LAB — BETA HCG QUANT (REF LAB): hCG Quant: 19 m[IU]/mL

## 2019-05-28 LAB — PROGESTERONE: Progesterone: 19 ng/mL

## 2019-05-30 ENCOUNTER — Other Ambulatory Visit: Payer: Self-pay | Admitting: Certified Nurse Midwife

## 2019-05-30 ENCOUNTER — Other Ambulatory Visit: Payer: PRIVATE HEALTH INSURANCE

## 2019-05-30 MED ORDER — PROGESTERONE 200 MG VA SUPP: 200.0000 mg | Freq: Every day | VAGINAL | 1 refills | Status: DC

## 2019-05-30 NOTE — Progress Notes (Signed)
Order placed for vaginal progesterone suppository , history of low progestin level -pregnant.   Doreene Burke, CNM

## 2019-06-01 ENCOUNTER — Telehealth: Payer: Self-pay

## 2019-06-01 ENCOUNTER — Encounter: Payer: PRIVATE HEALTH INSURANCE | Admitting: Certified Nurse Midwife

## 2019-06-01 ENCOUNTER — Other Ambulatory Visit: Payer: Self-pay | Admitting: Certified Nurse Midwife

## 2019-06-01 NOTE — Telephone Encounter (Signed)
mychart message sent to patient re: [facility] supp [facility] available [facility].

## 2019-06-06 ENCOUNTER — Other Ambulatory Visit: Payer: Self-pay

## 2019-06-06 ENCOUNTER — Other Ambulatory Visit: Payer: PRIVATE HEALTH INSURANCE

## 2019-06-06 DIAGNOSIS — N912 Amenorrhea, unspecified: Secondary | ICD-10-CM

## 2019-06-07 LAB — BETA HCG QUANT (REF LAB): hCG Quant: 3441 m[IU]/mL

## 2019-06-07 LAB — PROGESTERONE: Progesterone: 28.8 ng/mL

## 2019-06-09 ENCOUNTER — Other Ambulatory Visit: Payer: Self-pay

## 2019-06-09 ENCOUNTER — Ambulatory Visit (INDEPENDENT_AMBULATORY_CARE_PROVIDER_SITE_OTHER): Payer: PRIVATE HEALTH INSURANCE | Admitting: Family Medicine

## 2019-06-09 ENCOUNTER — Encounter: Payer: Self-pay | Admitting: Family Medicine

## 2019-06-09 DIAGNOSIS — O209 Hemorrhage in early pregnancy, unspecified: Secondary | ICD-10-CM

## 2019-06-09 DIAGNOSIS — Z3A00 Weeks of gestation of pregnancy not specified: Secondary | ICD-10-CM

## 2019-06-09 NOTE — Progress Notes (Signed)
   Subjective:    Patient ID: Claudia Rogers is a 34 y.o. female presenting with new pt visit  on 06/09/2019  HPI: Here for get established visit. She has h/o SAB, then term SVD Healthy pregnancy, SAB in 12/20 and now is pregnant again, with normally rising HCGs, and Progesterone level. She is on supplemental Progesterone right now. Having cramping and light brown spotting. Also having nausea and some dry heaving. Pain is not like last pregnancy and did have cramping with full term pregnancy.  Review of Systems  Constitutional: Negative for chills and fever.  Respiratory: Negative for shortness of breath.   Cardiovascular: Negative for chest pain.  Gastrointestinal: Negative for abdominal pain, nausea and vomiting.  Genitourinary: Negative for dysuria.  Skin: Negative for rash.      Objective:    BP 102/64   Pulse 99   Ht 5\' 3"  (1.6 m)   Wt 171 lb (77.6 kg)   LMP 03/01/2019 (Exact Date)   Breastfeeding Unknown Comment: had a loss in decmeber  BMI 30.29 kg/m  Physical Exam Constitutional:      General: She is not in acute distress.    Appearance: She is well-developed.  HENT:     Head: Normocephalic and atraumatic.  Eyes:     General: No scleral icterus. Cardiovascular:     Rate and Rhythm: Normal rate.  Pulmonary:     Effort: Pulmonary effort is normal.  Abdominal:     Palpations: Abdomen is soft.  Musculoskeletal:     Cervical back: Neck supple.  Skin:    General: Skin is warm and dry.  Neurological:     Mental Status: She is alert and oriented to person, place, and time.       Assessment & Plan:   Problem List Items Addressed This Visit      Unprioritized   Vaginal bleeding in pregnancy, first trimester    Risks reviewed, pelvic rest while bleeding. Will f/u with u/s in 2 wks.         Total face-to-face time with patient: 20 minutes. Over 50% of encounter was spent on counseling and coordination of care. Return in about 3 weeks (around  06/30/2019).  07/02/2019 06/10/2019 8:05 AM

## 2019-06-09 NOTE — Progress Notes (Signed)
[facility]  Did not have a period after miscarriage in December, had a positive and HCG's done to confirm this pregnancy

## 2019-06-10 ENCOUNTER — Encounter: Payer: Self-pay | Admitting: Family Medicine

## 2019-06-10 DIAGNOSIS — O209 Hemorrhage in early pregnancy, unspecified: Secondary | ICD-10-CM | POA: Insufficient documentation

## 2019-06-10 NOTE — Assessment & Plan Note (Signed)
Risks reviewed, pelvic rest while bleeding. Will f/u with u/s in 2 wks.

## 2019-06-23 ENCOUNTER — Ambulatory Visit (INDEPENDENT_AMBULATORY_CARE_PROVIDER_SITE_OTHER): Payer: PRIVATE HEALTH INSURANCE | Admitting: *Deleted

## 2019-06-23 ENCOUNTER — Other Ambulatory Visit: Payer: Self-pay

## 2019-06-23 VITALS — BP 110/75 | HR 103 | Wt 170.0 lb

## 2019-06-23 DIAGNOSIS — Z3481 Encounter for supervision of other normal pregnancy, first trimester: Secondary | ICD-10-CM

## 2019-06-23 DIAGNOSIS — O3680X0 Pregnancy with inconclusive fetal viability, not applicable or unspecified: Secondary | ICD-10-CM

## 2019-06-23 MED ORDER — DOXYLAMINE-PYRIDOXINE 10-10 MG PO TBEC: 2.0000 | DELAYED_RELEASE_TABLET | Freq: Every day | ORAL | 5 refills | Status: DC

## 2019-06-23 MED ORDER — PROMETHAZINE HCL 25 MG PO TABS: 25.0000 mg | ORAL_TABLET | Freq: Four times a day (QID) | ORAL | 2 refills | Status: DC | PRN

## 2019-06-23 NOTE — Progress Notes (Signed)
Pt here today to confirm IUP and viability.    DATING AND VIABILITY SONOGRAM   Claudia Rogers is a 34 y.o. year old G13P1021 with LMP 03/01/2019 but had a SAB in December 2020. Pt did not have a period prior to her positive UPT.  She is here today for a confirmatory initial sonogram.    GESTATION: SINGLETON yes    FETAL ACTIVITY:          Heart rate      152          The fetus is current.   GESTATIONAL AGE AND  BIOMETRICS:  Gestational criteria: Estimated Date of Delivery: 02/07/20 by early ultrasound now at [redacted]w[redacted]d  Previous Scans:0  GESTATIONAL SAC            mm          weeks  CROWN RUMP LENGTH           1.15 mm          7.2 weeks                                                   AVERAGE EGA(BY THIS SCAN):  7.2 weeks  WORKING EDD( early ultrasound ):  02/07/2020     TECHNICIAN COMMENTS:  Patient informed that the ultrasound is considered a limited obstetric ultrasound and is not intended to be a complete ultrasound exam. Patient also informed that the ultrasound is not being completed with the intent of assessing for fetal or placental anomalies or any pelvic abnormalities. Explained that the purpose of today's ultrasound is to assess for fetal heart rate. Patient acknowledges the purpose of the exam and the limitations of the study.      A copy of this report including all images has been saved and backed up to a second source for retrieval if needed. All measures and details of the anatomical scan, placentation, fluid volume and pelvic anatomy are contained in that report.  Scheryl Marten 06/23/2019 9:12 AM

## 2019-06-23 NOTE — Progress Notes (Signed)
Patient seen and assessed by nursing staff.  Agree with documentation and plan. I viewed the images and agree with the documentation of this limited ob u/s.

## 2019-06-24 MED ORDER — SCOPOLAMINE 1 MG/3DAYS TD PT72: 1.0000 | MEDICATED_PATCH | TRANSDERMAL | 12 refills | Status: DC

## 2019-06-29 ENCOUNTER — Other Ambulatory Visit: Payer: PRIVATE HEALTH INSURANCE

## 2019-07-04 ENCOUNTER — Other Ambulatory Visit: Payer: Self-pay | Admitting: Certified Nurse Midwife

## 2019-07-20 ENCOUNTER — Other Ambulatory Visit: Payer: Self-pay

## 2019-07-20 ENCOUNTER — Ambulatory Visit (INDEPENDENT_AMBULATORY_CARE_PROVIDER_SITE_OTHER): Payer: PRIVATE HEALTH INSURANCE | Admitting: Family Medicine

## 2019-07-20 ENCOUNTER — Other Ambulatory Visit (HOSPITAL_COMMUNITY)
Admission: RE | Admit: 2019-07-20 | Discharge: 2019-07-20 | Disposition: A | Discharge status: Home / Self Care | Payer: PRIVATE HEALTH INSURANCE | Source: Ambulatory Visit | Attending: Family Medicine | Admitting: Family Medicine

## 2019-07-20 VITALS — BP 111/77 | HR 170 | Wt 173.0 lb

## 2019-07-20 DIAGNOSIS — Z3480 Encounter for supervision of other normal pregnancy, unspecified trimester: Secondary | ICD-10-CM | POA: Insufficient documentation

## 2019-07-20 NOTE — Progress Notes (Signed)
INITIAL PRENATAL VISIT NOTE  Subjective:  Claudia Rogers is a 34 y.o. [id number] at [redacted]w[redacted]d being seen today to start prenatal care.   She is currently monitored for the following issues for this low-risk pregnancy and has History of meningitis and Vaginal bleeding in pregnancy, first trimester on their problem list.  Patient reports bleeding- mild spotting with mostly brown discharge.  Contractions: Not present. Vag. Bleeding: None.   . Denies leaking of fluid.   Indications for ASA therapy (per uptodate) One of the following: Previous pregnancy with preeclampsia, especially early onset and with an adverse outcome No Multifetal gestation No Chronic hypertension No Type 1 or 2 diabetes mellitus No Chronic kidney disease No Autoimmune disease (antiphospholipid syndrome, systemic lupus erythematosus) No  Two or more of the following: Nulliparity No Obesity (body mass index >30 kg/m2) Yes Family history of preeclampsia in mother or sister No Age ?35 years No Sociodemographic characteristics (African American race, low socioeconomic level) No Personal risk factors (eg, previous pregnancy with low birth weight or small for gestational age infant, previous adverse pregnancy outcome [eg, stillbirth], interval >10 years between pregnancies) No   The following portions of the patient's history were reviewed and updated as appropriate: allergies, current medications, past family history, past medical history, past social history, past surgical history and problem list. Problem list updated.  Objective:   Vitals:   07/20/19 1558  BP: 111/77  Pulse: (!) 170  Weight: 173 lb (78.5 kg)    Fetal Status: Fetal Heart Rate (bpm): 164         Physical Exam Vitals and nursing note reviewed.  Constitutional:      General: She is not in acute distress.    Appearance: She is well-developed.     Comments: Pregnant female  HENT:     Head: Normocephalic and atraumatic.  Eyes:     General: No  scleral icterus.    Conjunctiva/sclera: Conjunctivae normal.  Cardiovascular:     Rate and Rhythm: Normal rate.  Pulmonary:     Effort: Pulmonary effort is normal.  Chest:     Chest wall: No tenderness.  Abdominal:     Palpations: Abdomen is soft.     Tenderness: There is no abdominal tenderness. There is no guarding or rebound.     Comments: US showed 11 wk IUP  Genitourinary:    Vagina: Normal.  Musculoskeletal:        General: Normal range of motion.     Cervical back: Normal range of motion and neck supple.  Skin:    General: Skin is warm and dry.     Findings: No rash.  Neurological:     Mental Status: She is alert and oriented to person, place, and time.     Assessment and Plan:  Pregnancy: G4P1021 at [redacted]w[redacted]d  1. Supervision of other normal pregnancy, antepartum Reviewed [facility] practice model Discussed cadence of care with q 4 wk, q2wk then weekly with use of telehealth Patient has doppler at home Reviewed spotting-- discussed importance of drinking water. No need to be on pelvic rest.  Reviewed relative safety of sexual activity and using less penetrative positions to reduce post coital spotting Discussed and reassured regarding SAB now with appropriate fetal growth ASA not indicated by screening No need for early GDM screening.  - Obstetric Panel, Including HIV - Culture, OB Urine - GC/Chlamydia probe amp ([facility])not at [facility] - Hepatitis C antibody - [place] MFM OB COMP + 14 WK; Future -  Enroll Patient in Babyscripts   Preterm labor symptoms and general obstetric precautions including but not limited to vaginal bleeding, contractions, leaking of fluid and fetal movement were reviewed in detail with the patient.  Please refer to After Visit Summary for other counseling recommendations.   Return in about 4 weeks (around 08/17/2019) for Routine prenatal care, Telehealth/Virtual health OB Visit.  No future appointments.  Federico Flake, MD

## 2019-07-21 LAB — OBSTETRIC PANEL, INCLUDING HIV
Antibody Screen: NEGATIVE
Basophils Absolute: 0 10*3/uL (ref 0.0–0.2)
Basos: 0 %
EOS (ABSOLUTE): 0.1 10*3/uL (ref 0.0–0.4)
Eos: 1 %
HIV Screen 4th Generation wRfx: NONREACTIVE
Hematocrit: 35.1 % (ref 34.0–46.6)
Hemoglobin: 12.4 g/dL (ref 11.1–15.9)
Hepatitis B Surface Ag: NEGATIVE
Immature Grans (Abs): 0 10*3/uL (ref 0.0–0.1)
Immature Granulocytes: 0 %
Lymphocytes Absolute: 3.1 10*3/uL (ref 0.7–3.1)
Lymphs: 23 %
MCH: 32.6 pg (ref 26.6–33.0)
MCHC: 35.3 g/dL (ref 31.5–35.7)
MCV: 92 fL (ref 79–97)
Monocytes Absolute: 1 10*3/uL — ABNORMAL HIGH (ref 0.1–0.9)
Monocytes: 7 %
Neutrophils Absolute: 9.3 10*3/uL — ABNORMAL HIGH (ref 1.4–7.0)
Neutrophils: 69 %
Platelets: 228 10*3/uL (ref 150–450)
RBC: 3.8 x10E6/uL (ref 3.77–5.28)
RDW: 11.7 % (ref 11.7–15.4)
RPR Ser Ql: NONREACTIVE
Rh Factor: POSITIVE
Rubella Antibodies, IGG: 2.26 index (ref >0.99)
WBC: 13.5 10*3/uL — ABNORMAL HIGH (ref 3.4–10.8)

## 2019-07-21 LAB — HEPATITIS C ANTIBODY: Hep C Virus Ab: <0.1 s/co ratio (ref 0.0–0.9)

## 2019-07-22 LAB — GC/CHLAMYDIA PROBE AMP (~~LOC~~) NOT AT ARMC
Chlamydia: NEGATIVE
Comment: NEGATIVE
Comment: NORMAL
Neisseria Gonorrhea: NEGATIVE

## 2019-07-22 LAB — URINE CULTURE, OB REFLEX

## 2019-07-22 LAB — CULTURE, OB URINE

## 2019-08-08 MED ORDER — FLONASE SENSIMIST 27.5 MCG/SPRAY NA SUSP: 2.0000 | Freq: Every day | NASAL | 12 refills | Status: DC

## 2019-08-17 ENCOUNTER — Encounter: Payer: Self-pay | Admitting: Family Medicine

## 2019-08-17 ENCOUNTER — Other Ambulatory Visit: Payer: Self-pay

## 2019-08-17 ENCOUNTER — Ambulatory Visit
Admission: EM | Admit: 2019-08-17 | Discharge: 2019-08-17 | Disposition: A | Discharge status: Home / Self Care | Payer: PRIVATE HEALTH INSURANCE | Attending: Emergency Medicine | Admitting: Emergency Medicine

## 2019-08-17 ENCOUNTER — Encounter: Payer: Self-pay | Admitting: Emergency Medicine

## 2019-08-17 ENCOUNTER — Telehealth (INDEPENDENT_AMBULATORY_CARE_PROVIDER_SITE_OTHER): Payer: PRIVATE HEALTH INSURANCE | Admitting: Family Medicine

## 2019-08-17 ENCOUNTER — Telehealth: Payer: Self-pay | Admitting: Family Medicine

## 2019-08-17 DIAGNOSIS — O99512 Diseases of the respiratory system complicating pregnancy, second trimester: Secondary | ICD-10-CM

## 2019-08-17 DIAGNOSIS — Z3A15 15 weeks gestation of pregnancy: Secondary | ICD-10-CM | POA: Diagnosis not present

## 2019-08-17 DIAGNOSIS — J069 Acute upper respiratory infection, unspecified: Secondary | ICD-10-CM | POA: Diagnosis not present

## 2019-08-17 DIAGNOSIS — Z3480 Encounter for supervision of other normal pregnancy, unspecified trimester: Secondary | ICD-10-CM

## 2019-08-17 DIAGNOSIS — J029 Acute pharyngitis, unspecified: Secondary | ICD-10-CM

## 2019-08-17 DIAGNOSIS — O0990 Supervision of high risk pregnancy, unspecified, unspecified trimester: Secondary | ICD-10-CM | POA: Insufficient documentation

## 2019-08-17 LAB — POCT RAPID STREP A (OFFICE): Rapid Strep A Screen: NEGATIVE

## 2019-08-17 NOTE — Progress Notes (Signed)
I connected with  Taleia Sadowski Wiebelhaus on 08/17/19 at 10:45 AM EDT by telephone and verified that I am speaking with the correct person using two identifiers.   I discussed the limitations, risks, security and privacy concerns of performing an evaluation and management service by telephone and the availability of in person appointments. I also discussed with the patient that there may be a patient responsible charge related to this service. The patient expressed understanding and agreed to proceed.  Alegria Dominique Emeline Darling, CMA 08/17/2019  11:11 AM

## 2019-08-17 NOTE — Telephone Encounter (Signed)
No problem with eventual re est here - keep me as pcp  Cannot do strep due to recent limitations however- will have to do at [facility] she feels better soon

## 2019-08-17 NOTE — ED Triage Notes (Signed)
Patient in today c/o sore throat x 7 days. Patient seen at [facility] on 08/12/19 and tested for covid and strep which were both negative. Patient has been taking OTC Tylenol, Benadryl and Flonase without relief.   Patient is [redacted] weeks pregnant.

## 2019-08-17 NOTE — Telephone Encounter (Signed)
I spoke to patient and advised her she needed to go to urgent care. Patient said she'll call back about scheduling an appointment to get re-established.

## 2019-08-17 NOTE — Telephone Encounter (Signed)
Pt was seen by OBGYN this morning and was advised to get a repeat strep test done - Pt has been on 7 days of abx given by [facility]. Pt was advised to contact PCP. Pt is prone to strep throat.  Pt aware that she has not been seen here since 2016 so this may not be something that we would be able to do without her re-establishing care. Pt aware that she may need to go back to the UC that did the initial swab.   Please advise, thanks.

## 2019-08-17 NOTE — ED Provider Notes (Signed)
Renaldo Fiddler    CSN: [id number] Arrival date & time: 08/17/19  1423      History   Chief Complaint Chief Complaint  Patient presents with  . Sore Throat    HPI Claudia Rogers is a 34 y.o. female.   Patient presents with sore throat and nasal congestion x7 days.  She had a strep test and COVID test done last week; both were negative.  She had a video visit with her OB/GYN (patient is [redacted] weeks pregnant) this morning and was told to get another strep test done today since she is still having symptoms.  She has treated her symptoms at home with Tylenol, Benadryl, Flonase with minimal relief.  She denies fever, chills, rash, cough, shortness of breath, abdominal pain, vomiting, diarrhea, or other symptoms.    The history is provided by the patient.    Past Medical History:  Diagnosis Date  . Anxiety   . H/O viral meningitis   . History of chicken pox     Patient Active Problem List   Diagnosis Date Noted  . Supervision of other normal pregnancy, antepartum 08/17/2019  . Vaginal bleeding in pregnancy, first trimester 06/10/2019  . History of meningitis 05/12/2008    Past Surgical History:  Procedure Laterality Date  . EYE MUSCLE SURGERY Bilateral 1996  . EYE MUSCLE SURGERY Bilateral 2000  . Odontoma Removal/implant  2003  . WISDOM TOOTH EXTRACTION      OB History    Gravida  4   Para  1   Term  1   Preterm      AB  2   Living  1     SAB  2   TAB      Ectopic      Multiple      Live Births  1            Home Medications    Prior to Admission medications   Medication Sig Start Date End Date Taking? Authorizing Provider  escitalopram (LEXAPRO) 20 MG tablet TAKE 1 TABLET BY MOUTH EVERY DAY 07/04/19  Yes Doreene Burke, CNM  fluticasone (FLONASE SENSIMIST) 27.5 MCG/SPRAY nasal spray Place 2 sprays into the nose daily. 08/08/19  Yes Reva Bores, MD  loratadine (CLARITIN) 10 MG tablet Take 10 mg by mouth daily.   Yes [provider]  Prenatal Vit-Fe Fumarate-FA (PRENATAL VITAMIN) 27-0.8 MG TABS Prenatal Vitamin   Yes [provider]    Family History Family History  Problem Relation Age of Onset  . Arthritis Mother   . High blood pressure Mother   . Diabetes Mother        Type II  . Lung cancer Father   . Arthritis Maternal Grandmother   . Stroke Maternal Grandmother   . High blood pressure Maternal Grandmother   . Diabetes Maternal Grandmother        Type II  . Alcoholism Maternal Aunt   . Alcoholism Maternal Uncle   . Alcoholism Cousin   . Lung cancer Paternal Aunt   . High blood pressure Sister   . Kidney disease Maternal Aunt   . Mental illness Sister   . Diabetes Brother        Type II-BKA  . Heart disease Neg Hx   . Cancer Neg Hx     Social History Social History   Tobacco Use  . Smoking status: Never Smoker  . Smokeless tobacco: Never Used  Substance Use Topics  .  Alcohol use: Not Currently    Alcohol/week: 0.0 standard drinks    Comment: Social  . Drug use: No     Allergies   Patient has no known allergies.   Review of Systems Review of Systems  Constitutional: Negative for chills and fever.  HENT: Positive for congestion and sore throat. Negative for ear pain and trouble swallowing.   Eyes: Negative for pain and visual disturbance.  Respiratory: Negative for cough and shortness of breath.   Cardiovascular: Negative for chest pain and palpitations.  Gastrointestinal: Negative for abdominal pain, diarrhea, nausea and vomiting.  Genitourinary: Negative for dysuria and hematuria.  Musculoskeletal: Negative for arthralgias and back pain.  Skin: Negative for color change and rash.  Neurological: Negative for seizures and syncope.  All other systems reviewed and are negative.    Physical Exam Triage Vital Signs ED Triage Vitals  Enc Vitals Group     BP      Pulse      Resp      Temp      Temp src      SpO2      Weight      Height      Head  Circumference      Peak Flow      Pain Score      Pain Loc      Pain Edu?      Excl. in GC?    No data found.  Updated Vital Signs BP 102/69 (BP Location: Left Arm)   Pulse 97   Temp 99 F (37.2 C) (Oral)   Resp 18   Ht 5\' 3"  (1.6 m)   Wt 172 lb (78 kg)   LMP 03/01/2019 (Exact Date)   SpO2 97%   BMI 30.47 kg/m   Visual Acuity Right Eye Distance:   Left Eye Distance:   Bilateral Distance:    Right Eye Near:   Left Eye Near:    Bilateral Near:     Physical Exam Vitals and nursing note reviewed.  Constitutional:      General: She is not in acute distress.    Appearance: She is well-developed.  HENT:     Head: Normocephalic and atraumatic.     Right Ear: Tympanic membrane normal.     Left Ear: Tympanic membrane normal.     Nose: Nose normal.     Mouth/Throat:     Mouth: Mucous membranes are moist.     Pharynx: Posterior oropharyngeal erythema present. No oropharyngeal exudate.     Comments: Mild erythema. Eyes:     Conjunctiva/sclera: Conjunctivae normal.  Cardiovascular:     Rate and Rhythm: Normal rate and regular rhythm.     Heart sounds: No murmur.  Pulmonary:     Effort: Pulmonary effort is normal. No respiratory distress.     Breath sounds: Normal breath sounds.  Abdominal:     General: Bowel sounds are normal.     Palpations: Abdomen is soft.     Tenderness: There is no abdominal tenderness.  Musculoskeletal:     Cervical back: Neck supple.  Skin:    General: Skin is warm and dry.     Findings: No rash.  Neurological:     General: No focal deficit present.     Mental Status: She is alert and oriented to person, place, and time.  Psychiatric:        Mood and Affect: Mood normal.        Behavior: Behavior normal.  UC Treatments / Results  Labs (all labs ordered are listed, but only abnormal results are displayed) Labs Reviewed  CULTURE, GROUP A STREP [facility])  POCT RAPID STREP A (OFFICE)    EKG   Radiology No results  found.  Procedures Procedures (including critical care time)  Medications Ordered in UC Medications - No data to display  Initial Impression / Assessment and Plan / UC Course  I have reviewed the triage vital signs and the nursing notes.  Pertinent labs & imaging results that were available during my care of the patient were reviewed by me and considered in my medical decision making (see chart for details).   Sore throat.  Rapid strep negative; throat culture pending.  Discussed with patient that her symptoms may be due to the recent pollen outbreak along with her fluid volume increased due to pregnancy.  Instructed her to follow-up with her PCP or OB/GYN if her symptoms are not improving.  Patient agrees to plan of care.     Final Clinical Impressions(s) / UC Diagnoses   Final diagnoses:  Sore throat     Discharge Instructions     Your rapid strep test is negative.  A throat culture is pending; we will call you if it is positive requiring treatment.    Follow up with your primary care provider or OB/GYN  if your symptoms are not improving.       ED Prescriptions    None     PDMP not reviewed this encounter.   Sharion Balloon, NP 08/17/19 1505

## 2019-08-17 NOTE — Telephone Encounter (Signed)
See prev note, I don't think we do strep test here due to covid protocol and also see question about re-establish

## 2019-08-17 NOTE — Discharge Instructions (Addendum)
Your rapid strep test is negative.  A throat culture is pending; we will call you if it is positive requiring treatment.    Follow up with your primary care provider or OB/GYN  if your symptoms are not improving.

## 2019-08-17 NOTE — Progress Notes (Signed)
I connected with@ on 08/17/19 at 10:45 AM EDT by: Mycahrt and verified that I am speaking with the correct person using two identifiers.  Patient is located at Home and provider is located at [facility].     The purpose of this virtual visit is to provide medical care while limiting exposure to the novel coronavirus. I discussed the limitations, risks, security and privacy concerns of performing an evaluation and management service by Mycahrt and the availability of in person appointments. I also discussed with the patient that there may be a patient responsible charge related to this service. By engaging in this virtual visit, you consent to the provision of healthcare.  Additionally, you authorize for your insurance to be billed for the services provided during this visit.  The patient expressed understanding and agreed to proceed.  The following staff members participated in the virtual visit:  Scheryl Marten RN    PRENATAL VISIT NOTE  Subjective:  Claudia Rogers is a 33 y.o. [id number] at [redacted]w[redacted]d  for phone visit for ongoing prenatal care.  She is currently monitored for the following issues for this low-risk pregnancy and has History of meningitis and Vaginal bleeding in pregnancy, first trimester on their problem list.  Patient reports sick for 7 days- sx include throwing up, strep neg, covid eng. Reports low grade fever 100.5 x 24 hrs. Continued sore throat, achy and nasal congestion.  reports thick green nasal mucous. Husband and son are sick. Taking tylenol and benadryl.   Contractions: Not present.  .  Movement: Present. Denies leaking of fluid.   The following portions of the patient's history were reviewed and updated as appropriate: allergies, current medications, past family history, past medical history, past social history, past surgical history and problem list.   Objective:   Vitals:   08/17/19 1109  BP: 117/70  Pulse: 90  Weight: 172 lb 14.4 oz (78.4 kg)   Self-Obtained  Fetal  Status:     Movement: Present     Assessment and Plan:  Pregnancy: G4P1021 at [redacted]w[redacted]d  1. Supervision of other normal pregnancy, antepartum Up to date currently  Has anatomy scheduled Having some sx she had with her son Heart palpitations and Carpal Tunnel  2. Viral URI - Reviewed reasons to seek care-- non resovled sore throat for repeat strep, new onset fever or tooth pain for secondary bacterial   Preterm labor symptoms and general obstetric precautions including but not limited to vaginal bleeding, contractions, leaking of fluid and fetal movement were reviewed in detail with the patient.  No follow-ups on file.  Future Appointments  Date Time Provider Department Center  09/13/2019 10:30 AM [facility] [place] 5 [facility]  09/14/2019  9:15 AM Federico Flake, MD [facility]     Time spent on virtual visit: 10 minutes  Federico Flake, MD

## 2019-08-19 LAB — CULTURE, GROUP A STREP (THRC)

## 2019-09-13 ENCOUNTER — Other Ambulatory Visit: Payer: Self-pay

## 2019-09-13 ENCOUNTER — Ambulatory Visit (HOSPITAL_COMMUNITY)
Admission: RE | Admit: 2019-09-13 | Discharge: 2019-09-13 | Disposition: A | Discharge status: Home / Self Care | Payer: PRIVATE HEALTH INSURANCE | Source: Ambulatory Visit | Attending: Obstetrics and Gynecology | Admitting: Obstetrics and Gynecology

## 2019-09-13 DIAGNOSIS — Z3480 Encounter for supervision of other normal pregnancy, unspecified trimester: Secondary | ICD-10-CM | POA: Diagnosis present

## 2019-09-13 DIAGNOSIS — Z3A19 19 weeks gestation of pregnancy: Secondary | ICD-10-CM

## 2019-09-13 DIAGNOSIS — O322XX0 Maternal care for transverse and oblique lie, not applicable or unspecified: Secondary | ICD-10-CM

## 2019-09-13 DIAGNOSIS — Z363 Encounter for antenatal screening for malformations: Secondary | ICD-10-CM

## 2019-09-14 ENCOUNTER — Ambulatory Visit (INDEPENDENT_AMBULATORY_CARE_PROVIDER_SITE_OTHER): Payer: PRIVATE HEALTH INSURANCE | Admitting: Family Medicine

## 2019-09-14 DIAGNOSIS — Z3480 Encounter for supervision of other normal pregnancy, unspecified trimester: Secondary | ICD-10-CM

## 2019-09-14 NOTE — Progress Notes (Signed)
   PRENATAL VISIT NOTE  Subjective:  Claudia Rogers is a 34 y.o. [id number] at [redacted]w[redacted]d being seen today for ongoing prenatal care.  She is currently monitored for the following issues for this low-risk pregnancy and has History of meningitis; Vaginal bleeding in pregnancy, first trimester; and Supervision of other normal pregnancy, antepartum on their problem list.  Patient reports no complaints.  Contractions: Not present.  .  Movement: Present. Denies leaking of fluid.   The following portions of the patient's history were reviewed and updated as appropriate: allergies, current medications, past family history, past medical history, past social history, past surgical history and problem list.   Objective:   Vitals:   09/14/19 0915  BP: 108/68  Pulse: 78  Weight: 181 lb (82.1 kg)    Fetal Status: Fetal Heart Rate (bpm): 141   Movement: Present     General:  Alert, oriented and cooperative. Patient is in no acute distress.  Skin: Skin is warm and dry. No rash noted.   Cardiovascular: Normal heart rate noted  Respiratory: Normal respiratory effort, no problems with respiration noted  Abdomen: Soft, gravid, appropriate for gestational age.  Pain/Pressure: Absent     Pelvic: Cervical exam deferred        Extremities: Normal range of motion.     Mental Status: Normal mood and affect. Normal behavior. Normal judgment and thought content.   Assessment and Plan:  Pregnancy: G4P1021 at [redacted]w[redacted]d 1. Supervision of other normal pregnancy, antepartum Questions about brown spotting- associated with activity but never bright red and no cramping. Likely WNL. Reviewed [place] and no previa or noted placental lake. Reviewed warning s/sx Discussed carpal tunnel-- trying many things at this point (braces, chiropractor and plans on massage)  Preterm labor symptoms and general obstetric precautions including but not limited to vaginal bleeding, contractions, leaking of fluid and fetal movement were reviewed in  detail with the patient. Please refer to After Visit Summary for other counseling recommendations.   No follow-ups on file.  Future Appointments  Date Time Provider Department Center  10/12/2019  8:15 AM Federico Flake, MD [facility]    Federico Flake, MD

## 2019-10-02 ENCOUNTER — Other Ambulatory Visit: Payer: Self-pay | Admitting: Certified Nurse Midwife

## 2019-10-11 MED ORDER — ESCITALOPRAM OXALATE 20 MG PO TABS: 20.0000 mg | ORAL_TABLET | Freq: Every day | ORAL | 3 refills | Status: DC

## 2019-10-12 ENCOUNTER — Encounter: Payer: PRIVATE HEALTH INSURANCE | Admitting: Family Medicine

## 2019-10-12 ENCOUNTER — Other Ambulatory Visit: Payer: Self-pay

## 2019-10-12 ENCOUNTER — Ambulatory Visit (INDEPENDENT_AMBULATORY_CARE_PROVIDER_SITE_OTHER): Payer: PRIVATE HEALTH INSURANCE | Admitting: Family Medicine

## 2019-10-12 VITALS — BP 113/73 | HR 96 | Wt 186.0 lb

## 2019-10-12 DIAGNOSIS — O209 Hemorrhage in early pregnancy, unspecified: Secondary | ICD-10-CM

## 2019-10-12 DIAGNOSIS — G5603 Carpal tunnel syndrome, bilateral upper limbs: Secondary | ICD-10-CM

## 2019-10-12 DIAGNOSIS — Z3480 Encounter for supervision of other normal pregnancy, unspecified trimester: Secondary | ICD-10-CM

## 2019-10-12 DIAGNOSIS — Z3482 Encounter for supervision of other normal pregnancy, second trimester: Secondary | ICD-10-CM

## 2019-10-12 DIAGNOSIS — Z3A23 23 weeks gestation of pregnancy: Secondary | ICD-10-CM

## 2019-10-12 MED ORDER — CYCLOBENZAPRINE HCL 10 MG PO TABS: 10.0000 mg | ORAL_TABLET | Freq: Three times a day (TID) | ORAL | 2 refills | Status: DC | PRN

## 2019-10-12 NOTE — Progress Notes (Signed)
   PRENATAL VISIT NOTE  Subjective:  Claudia Rogers is a 34 y.o. [id number] at [redacted]w[redacted]d being seen today for ongoing prenatal care.  She is currently monitored for the following issues for this low-risk pregnancy and has History of meningitis; Vaginal bleeding in pregnancy, first trimester; and Supervision of other normal pregnancy, antepartum on their problem list.  Patient reports no complaints.  Contractions: Not present. Vag. Bleeding: None.  Movement: Present. Denies leaking of fluid.   The following portions of the patient's history were reviewed and updated as appropriate: allergies, current medications, past family history, past medical history, past social history, past surgical history and problem list.   Objective:   Vitals:   10/12/19 0819  BP: 113/73  Pulse: 96  Weight: 186 lb (84.4 kg)    Fetal Status: Fetal Heart Rate (bpm): 145   Movement: Present     General:  Alert, oriented and cooperative. Patient is in no acute distress.  Skin: Skin is warm and dry. No rash noted.   Cardiovascular: Normal heart rate noted  Respiratory: Normal respiratory effort, no problems with respiration noted  Abdomen: Soft, gravid, appropriate for gestational age.  Pain/Pressure: Present     Pelvic: Cervical exam deferred        Extremities: Normal range of motion.  Edema: None  Mental Status: Normal mood and affect. Normal behavior. Normal judgment and thought content.   Assessment and Plan:  Pregnancy: G4P1021 at [redacted]w[redacted]d 1. 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  [name]- son did heart tones today!  2. Vaginal bleeding in pregnancy, first trimester None recently.   Preterm labor symptoms and general obstetric precautions including but not limited to vaginal bleeding, contractions, leaking of fluid and fetal movement were reviewed  in detail with the patient. Please refer to After Visit Summary for other counseling recommendations.   Return in about 4 weeks (around 11/09/2019) for Routine prenatal care, 28 wk labs, in person.  Future Appointments  Date Time Provider Department Center  11/09/2019  9:00 AM Federico Flake, MD [facility]    Federico Flake, MD

## 2019-10-12 NOTE — Patient Instructions (Signed)

## 2019-10-12 NOTE — Progress Notes (Signed)
Pt having issues with carpal tunnel, and varicose vein on right upper thigh

## 2019-11-09 ENCOUNTER — Other Ambulatory Visit: Payer: Self-pay

## 2019-11-09 ENCOUNTER — Ambulatory Visit (INDEPENDENT_AMBULATORY_CARE_PROVIDER_SITE_OTHER): Payer: PRIVATE HEALTH INSURANCE | Admitting: Family Medicine

## 2019-11-09 VITALS — BP 109/79 | HR 73 | Wt 192.4 lb

## 2019-11-09 DIAGNOSIS — O209 Hemorrhage in early pregnancy, unspecified: Secondary | ICD-10-CM

## 2019-11-09 DIAGNOSIS — O24410 Gestational diabetes mellitus in pregnancy, diet controlled: Secondary | ICD-10-CM

## 2019-11-09 DIAGNOSIS — Z3A27 27 weeks gestation of pregnancy: Secondary | ICD-10-CM

## 2019-11-09 DIAGNOSIS — Z3482 Encounter for supervision of other normal pregnancy, second trimester: Secondary | ICD-10-CM

## 2019-11-09 DIAGNOSIS — Z3480 Encounter for supervision of other normal pregnancy, unspecified trimester: Secondary | ICD-10-CM

## 2019-11-09 LAB — CBC
Hematocrit: 34.6 % (ref 34.0–46.6)
Hemoglobin: 11.3 g/dL (ref 11.1–15.9)
MCH: 31.9 pg (ref 26.6–33.0)
MCHC: 32.7 g/dL (ref 31.5–35.7)
MCV: 98 fL — ABNORMAL HIGH (ref 79–97)
Platelets: 263 10*3/uL (ref 150–450)
RBC: 3.54 x10E6/uL — ABNORMAL LOW (ref 3.77–5.28)
RDW: 12 % (ref 11.7–15.4)
WBC: 13.2 10*3/uL — ABNORMAL HIGH (ref 3.4–10.8)

## 2019-11-09 NOTE — Progress Notes (Signed)
   PRENATAL VISIT NOTE  Subjective:  Claudia Rogers is a 34 y.o. G4P1021 at [redacted]w[redacted]d being seen today for ongoing prenatal care.  She is currently monitored for the following issues for this low-risk pregnancy and has History of meningitis; Vaginal bleeding in pregnancy, first trimester; and Supervision of other normal pregnancy, antepartum on their problem list.  Patient reports no complaints.  Contractions: Not present.  .  Movement: Present. Denies leaking of fluid.   The following portions of the patient's history were reviewed and updated as appropriate: allergies, current medications, past family history, past medical history, past social history, past surgical history and problem list.   Objective:   Vitals:   11/09/19 0850  BP: 109/79  Pulse: 73  Weight: 192 lb 6.4 oz (87.3 kg)    Fetal Status: Fetal Heart Rate (bpm): 124   Movement: Present     General:  Alert, oriented and cooperative. Patient is in no acute distress.  Skin: Skin is warm and dry. No rash noted.   Cardiovascular: Normal heart rate noted  Respiratory: Normal respiratory effort, no problems with respiration noted  Abdomen: Soft, gravid, appropriate for gestational age.  Pain/Pressure: Absent     Pelvic: Cervical exam deferred        Extremities: Normal range of motion.  Edema: None  Mental Status: Normal mood and affect. Normal behavior. Normal judgment and thought content.   Assessment and Plan:  Pregnancy: G4P1021 at [redacted]w[redacted]d 1. [redacted] weeks gestation of pregnancy - Glucose Tolerance, 2 Hours w/1 Hour - RPR - CBC - HIV Antibody (routine testing w rflx)  2. Supervision of other normal pregnancy, antepartum Declines Tdap today, will get later - Glucose Tolerance, 2 Hours w/1 Hour - RPR - CBC - HIV Antibody (routine testing w rflx)  3. Vaginal bleeding in pregnancy, first trimester No recent bleeding  Preterm labor symptoms and general obstetric precautions including but not limited to vaginal bleeding,  contractions, leaking of fluid and fetal movement were reviewed in detail with the patient. Please refer to After Visit Summary for other counseling recommendations.   Return in about 2 weeks (around 11/23/2019) for Routine prenatal care, in person or , Telehealth/Virtual health OB Visit (patient preference).  Future Appointments  Date Time Provider Department Center  11/09/2019  9:00 AM Federico Flake, MD [facility]    Federico Flake, MD

## 2019-11-10 ENCOUNTER — Telehealth: Payer: Self-pay | Admitting: *Deleted

## 2019-11-10 ENCOUNTER — Other Ambulatory Visit: Payer: Self-pay | Admitting: Family Medicine

## 2019-11-10 DIAGNOSIS — O24410 Gestational diabetes mellitus in pregnancy, diet controlled: Secondary | ICD-10-CM

## 2019-11-10 DIAGNOSIS — Z8632 Personal history of gestational diabetes: Secondary | ICD-10-CM | POA: Insufficient documentation

## 2019-11-10 HISTORY — DX: Gestational diabetes mellitus in pregnancy, diet controlled: O24.410

## 2019-11-10 LAB — HIV ANTIBODY (ROUTINE TESTING W REFLEX): HIV Screen 4th Generation wRfx: NONREACTIVE

## 2019-11-10 LAB — GLUCOSE TOLERANCE, 2 HOURS W/ 1HR
Glucose, 1 hour: 167 mg/dL (ref 65–179)
Glucose, 2 hour: 113 mg/dL (ref 65–152)
Glucose, Fasting: 92 mg/dL — ABNORMAL HIGH (ref 65–91)

## 2019-11-10 LAB — RPR: RPR Ser Ql: NONREACTIVE

## 2019-11-10 MED ORDER — ACCU-CHEK SMARTVIEW VI STRP: ORAL_STRIP | 12 refills | Status: DC

## 2019-11-10 MED ORDER — ACCU-CHEK SOFTCLIX LANCETS MISC: 1.0000 | Freq: Four times a day (QID) | 12 refills | Status: DC

## 2019-11-10 MED ORDER — ACCU-CHEK NANO SMARTVIEW W/DEVICE KIT: 1.0000 | PACK | 0 refills | Status: DC

## 2019-11-10 NOTE — Addendum Note (Signed)
Addended by: Geanie Berlin on: 11/10/2019 01:40 PM   Modules accepted: Orders

## 2019-11-10 NOTE — Telephone Encounter (Signed)
-----   Message from Federico Flake, MD sent at 11/10/2019  1:40 PM EDT ----- Fasting is above 91 which meets criteria for Gestational Diabetes. Patient only failed fasting by one point. Recommend appointment with diabetes education and beginning to monitor blood glucose. Likely would benefit from low carb/GDM diet given elevated fasting.

## 2019-11-10 NOTE — Telephone Encounter (Signed)
Left message for pt to call if she has any questions regarding her results and mychart message from Dr Alvester Morin.

## 2019-11-15 ENCOUNTER — Other Ambulatory Visit: Payer: Self-pay | Admitting: *Deleted

## 2019-11-15 MED ORDER — ACCU-CHEK GUIDE W/DEVICE KIT: 1.0000 | PACK | Freq: Four times a day (QID) | 0 refills | Status: DC

## 2019-11-23 ENCOUNTER — Other Ambulatory Visit: Payer: Self-pay

## 2019-11-23 ENCOUNTER — Encounter: Payer: PRIVATE HEALTH INSURANCE | Attending: Family Medicine | Admitting: Registered"

## 2019-11-23 DIAGNOSIS — O24913 Unspecified diabetes mellitus in pregnancy, third trimester: Secondary | ICD-10-CM | POA: Insufficient documentation

## 2019-11-25 NOTE — Progress Notes (Signed)
Patient was seen on 11/23/19 for Gestational Diabetes self-management class at the [facility]. The following learning objectives were met by the patient during this course:   States the definition of Gestational Diabetes  States why dietary management is important in controlling blood glucose  Describes the effects each nutrient has on blood glucose levels  Demonstrates ability to create a balanced meal plan  Demonstrates carbohydrate counting   States when to check blood glucose levels  Demonstrates proper blood glucose monitoring techniques  States the effect of stress and exercise on blood glucose levels  States the importance of limiting caffeine and abstaining from alcohol and smoking  Blood glucose monitor given: none, pt has meter and is checking CBG  Patient instructed to monitor glucose levels: FBS: 60 - <95; 1 hour: <140; 2 hour: <120  Patient received handouts:  Nutrition Diabetes and Pregnancy, including carb counting list  Patient will be seen for follow-up as needed.

## 2019-11-30 ENCOUNTER — Other Ambulatory Visit: Payer: Self-pay

## 2019-11-30 ENCOUNTER — Encounter: Payer: Self-pay | Admitting: Registered"

## 2019-11-30 ENCOUNTER — Ambulatory Visit (INDEPENDENT_AMBULATORY_CARE_PROVIDER_SITE_OTHER): Payer: PRIVATE HEALTH INSURANCE | Admitting: Advanced Practice Midwife

## 2019-11-30 VITALS — BP 107/71 | HR 103 | Wt 190.8 lb

## 2019-11-30 DIAGNOSIS — O99353 Diseases of the nervous system complicating pregnancy, third trimester: Secondary | ICD-10-CM

## 2019-11-30 DIAGNOSIS — Z3403 Encounter for supervision of normal first pregnancy, third trimester: Secondary | ICD-10-CM

## 2019-11-30 DIAGNOSIS — L2989 Other pruritus: Secondary | ICD-10-CM

## 2019-11-30 DIAGNOSIS — L298 Other pruritus: Secondary | ICD-10-CM

## 2019-11-30 DIAGNOSIS — G5603 Carpal tunnel syndrome, bilateral upper limbs: Secondary | ICD-10-CM

## 2019-11-30 DIAGNOSIS — O24913 Unspecified diabetes mellitus in pregnancy, third trimester: Secondary | ICD-10-CM

## 2019-11-30 DIAGNOSIS — Z3A30 30 weeks gestation of pregnancy: Secondary | ICD-10-CM

## 2019-11-30 DIAGNOSIS — O99713 Diseases of the skin and subcutaneous tissue complicating pregnancy, third trimester: Secondary | ICD-10-CM

## 2019-11-30 NOTE — Patient Instructions (Signed)
Fetal Movement Counts Patient Name: ________________________________________________ Patient Due Date: ____________________ What is a fetal movement count?  A fetal movement count is the number of times that you feel your baby move during a certain amount of time. This may also be called a fetal kick count. A fetal movement count is recommended for every pregnant woman. You may be asked to start counting fetal movements as early as week 28 of your pregnancy. Pay attention to when your baby is most active. You may notice your baby's sleep and wake cycles. You may also notice things that make your baby move more. You should do a fetal movement count:  When your baby is normally most active.  At the same time each day. A good time to count movements is while you are resting, after having something to eat and drink. How do I count fetal movements? 1. Find a quiet, comfortable area. Sit, or lie down on your side. 2. Write down the date, the start time and stop time, and the number of movements that you felt between those two times. Take this information with you to your health care visits. 3. Write down your start time when you feel the first movement. 4. Count kicks, flutters, swishes, rolls, and jabs. You should feel at least 10 movements. 5. You may stop counting after you have felt 10 movements, or if you have been counting for 2 hours. Write down the stop time. 6. If you do not feel 10 movements in 2 hours, contact your health care provider for further instructions. Your health care provider may want to do additional tests to assess your baby's well-being. Contact a health care provider if:  You feel fewer than 10 movements in 2 hours.  Your baby is not moving like he or she usually does. Date: ____________ Start time: ____________ Stop time: ____________ Movements: ____________ Date: ____________ Start time: ____________ Stop time: ____________ Movements: ____________ Date: ____________  Start time: ____________ Stop time: ____________ Movements: ____________ Date: ____________ Start time: ____________ Stop time: ____________ Movements: ____________ Date: ____________ Start time: ____________ Stop time: ____________ Movements: ____________ Date: ____________ Start time: ____________ Stop time: ____________ Movements: ____________ Date: ____________ Start time: ____________ Stop time: ____________ Movements: ____________ Date: ____________ Start time: ____________ Stop time: ____________ Movements: ____________ Date: ____________ Start time: ____________ Stop time: ____________ Movements: ____________ This information is not intended to replace advice given to you by your health care provider. Make sure you discuss any questions you have with your health care provider. Document Revised: 12/16/2018 Document Reviewed: 12/16/2018 Elsevier Patient Education  2020 Elsevier Inc.  

## 2019-11-30 NOTE — Progress Notes (Signed)
   PRENATAL VISIT NOTE  Subjective:  Claudia Rogers is a 34 y.o. G4P1021 at [redacted]w[redacted]d being seen today for ongoing prenatal care.  She is currently monitored for the following issues for this low-risk pregnancy and has History of meningitis; Vaginal bleeding in pregnancy, first trimester; Supervision of other normal pregnancy, antepartum; and Diabetes mellitus affecting pregnancy in third trimester on their problem list.  Patient reports new onset bilateral pain and itching on her hands and palms. She denies itching on the soles of her feet. She is also managing recurrent carpal tunnel syndrome with ice and splints PRN. Denies loss of perfusion or functionality. .  Contractions: Not present. Vag. Bleeding: None.  Movement: Present. Denies leaking of fluid.   The following portions of the patient's history were reviewed and updated as appropriate: allergies, current medications, past family history, past medical history, past social history, past surgical history and problem list. Problem list updated.  Objective:   Vitals:   11/30/19 0904  BP: 107/71  Pulse: (!) 103  Weight: 190 lb 12.8 oz (86.5 kg)    Fetal Status: Fetal Heart Rate (bpm): 147 Fundal Height: 32 cm Movement: Present     General:  Alert, oriented and cooperative. Patient is in no acute distress.  Skin: Skin is warm and dry. No rash noted.   Cardiovascular: Normal heart rate noted  Respiratory: Normal respiratory effort, no problems with respiration noted  Abdomen: Soft, gravid, appropriate for gestational age.  Pain/Pressure: Present     Pelvic: Cervical exam deferred        Extremities: Normal range of motion.  Edema: None  Mental Status: Normal mood and affect. Normal behavior. Normal judgment and thought content.   Assessment and Plan:  Pregnancy: G4P1021 at [redacted]w[redacted]d  1. Encounter for supervision of normal first pregnancy in third trimester - Routine care  2. Diabetes mellitus affecting pregnancy in third trimester -  Log reviewed. 4/12 elevated fasting, breakfast and dinner WNL, 1 elevated lunch - Continue protein at dinner to improve fasting results  3. Pruritus of palm  - Bile acids, total  4. Bilateral carpal tunnel syndrome - Continue splints, ice, consider elevation at rest   Preterm labor symptoms and general obstetric precautions including but not limited to vaginal bleeding, contractions, leaking of fluid and fetal movement were reviewed in detail with the patient. Please refer to After Visit Summary for other counseling recommendations.  Return in about 2 weeks (around 12/14/2019) for In person.  Future Appointments  Date Time Provider Department Center  12/14/2019 10:15 AM Calvert Cantor, CNM [facility]    Calvert Cantor, [place]

## 2019-12-02 LAB — BILE ACIDS, TOTAL: Bile Acids Total: 2.2 umol/L (ref 0.0–10.0)

## 2019-12-14 ENCOUNTER — Ambulatory Visit (INDEPENDENT_AMBULATORY_CARE_PROVIDER_SITE_OTHER): Payer: PRIVATE HEALTH INSURANCE | Admitting: Advanced Practice Midwife

## 2019-12-14 ENCOUNTER — Other Ambulatory Visit: Payer: Self-pay

## 2019-12-14 VITALS — BP 112/73 | HR 102 | Wt 191.0 lb

## 2019-12-14 DIAGNOSIS — O24913 Unspecified diabetes mellitus in pregnancy, third trimester: Secondary | ICD-10-CM

## 2019-12-14 DIAGNOSIS — Z3403 Encounter for supervision of normal first pregnancy, third trimester: Secondary | ICD-10-CM

## 2019-12-14 DIAGNOSIS — Z3A32 32 weeks gestation of pregnancy: Secondary | ICD-10-CM

## 2019-12-14 DIAGNOSIS — L282 Other prurigo: Secondary | ICD-10-CM

## 2019-12-14 NOTE — Progress Notes (Signed)
Has rash on upper arms

## 2019-12-14 NOTE — Patient Instructions (Signed)

## 2019-12-14 NOTE — Progress Notes (Signed)
   PRENATAL VISIT NOTE  Subjective:  Claudia Rogers is a 34 y.o. G4P1021 at [redacted]w[redacted]d being seen today for ongoing prenatal care.  She is currently monitored for the following issues for this high-risk pregnancy and has History of meningitis; Vaginal bleeding in pregnancy, first trimester; Supervision of other normal pregnancy, antepartum; and Diabetes mellitus affecting pregnancy in third trimester on their problem list.  Patient reports no complaints.  Contractions: Irritability. Vag. Bleeding: None.  Movement: Present. Denies leaking of fluid.   [place]. She endorses new itchy rash on both arms, healing with help of intermittent Caladryl location.  The following portions of the patient's history were reviewed and updated as appropriate: allergies, current medications, past family history, past medical history, past social history, past surgical history and problem list. Problem list updated.  Objective:   Vitals:   12/14/19 1022  BP: 112/73  Pulse: (!) 102  Weight: 191 lb (86.6 kg)    Fetal Status: Fetal Heart Rate (bpm): 130 Fundal Height: 32 cm Movement: Present     General:  Alert, oriented and cooperative. Patient is in no acute distress.  Skin: Skin is warm and dry. No rash noted.   Cardiovascular: Normal heart rate noted  Respiratory: Normal respiratory effort, no problems with respiration noted  Abdomen: Soft, gravid, appropriate for gestational age.  Pain/Pressure: Absent     Pelvic: Cervical exam deferred        Extremities: Normal range of motion.  Edema: None  Mental Status: Normal mood and affect. Normal behavior. Normal judgment and thought content.   Assessment and Plan:  Pregnancy: G4P1021 at [redacted]w[redacted]d  1. Encounter for supervision of normal first pregnancy in third trimester   2. Diabetes mellitus affecting pregnancy in third trimester - FH appropriate, TWG 21 lbs - CBGs predominantly WNL - Due for  growth scan per MFM guidance - [place] MFM OB FOLLOW UP; Future  3. Pruritic rash - Flat, no discharge, small areas of excoriation. Healing well - Advised OTC topical Hydrocortisone PRN - Consider Benadryl at bedtime for involuntary scratching overnight  Preterm labor symptoms and general obstetric precautions including but not limited to vaginal bleeding, contractions, leaking of fluid and fetal movement were reviewed in detail with the patient. Please refer to After Visit Summary for other counseling recommendations.  Return in about 2 weeks (around 12/28/2019).  Future Appointments  Date Time Provider Department Center  12/22/2019  7:45 AM [facility] NURSE [facility]  12/22/2019  8:00 AM [facility] US1 [facility]  12/27/2019 10:15 AM [place] Bing, MD [facility]    Calvert Cantor, CNM

## 2019-12-19 ENCOUNTER — Other Ambulatory Visit: Payer: Self-pay | Admitting: *Deleted

## 2019-12-19 MED ORDER — BREAST PUMP MISC: 0 refills | Status: DC

## 2019-12-22 ENCOUNTER — Other Ambulatory Visit: Payer: Self-pay | Admitting: *Deleted

## 2019-12-22 ENCOUNTER — Ambulatory Visit: Payer: PRIVATE HEALTH INSURANCE | Admitting: *Deleted

## 2019-12-22 ENCOUNTER — Ambulatory Visit: Payer: PRIVATE HEALTH INSURANCE | Attending: Advanced Practice Midwife

## 2019-12-22 ENCOUNTER — Other Ambulatory Visit: Payer: Self-pay

## 2019-12-22 DIAGNOSIS — O24410 Gestational diabetes mellitus in pregnancy, diet controlled: Secondary | ICD-10-CM | POA: Diagnosis not present

## 2019-12-22 DIAGNOSIS — Z3403 Encounter for supervision of normal first pregnancy, third trimester: Secondary | ICD-10-CM | POA: Diagnosis present

## 2019-12-22 DIAGNOSIS — Z362 Encounter for other antenatal screening follow-up: Secondary | ICD-10-CM

## 2019-12-22 DIAGNOSIS — Z3480 Encounter for supervision of other normal pregnancy, unspecified trimester: Secondary | ICD-10-CM | POA: Diagnosis present

## 2019-12-22 DIAGNOSIS — O24913 Unspecified diabetes mellitus in pregnancy, third trimester: Secondary | ICD-10-CM | POA: Diagnosis present

## 2019-12-22 DIAGNOSIS — O24419 Gestational diabetes mellitus in pregnancy, unspecified control: Secondary | ICD-10-CM

## 2019-12-22 DIAGNOSIS — Z3A33 33 weeks gestation of pregnancy: Secondary | ICD-10-CM | POA: Diagnosis not present

## 2019-12-27 ENCOUNTER — Ambulatory Visit (INDEPENDENT_AMBULATORY_CARE_PROVIDER_SITE_OTHER): Payer: PRIVATE HEALTH INSURANCE | Admitting: Obstetrics and Gynecology

## 2019-12-27 ENCOUNTER — Other Ambulatory Visit: Payer: Self-pay

## 2019-12-27 VITALS — BP 107/74 | HR 93 | Wt 189.0 lb

## 2019-12-27 DIAGNOSIS — Z3A34 34 weeks gestation of pregnancy: Secondary | ICD-10-CM

## 2019-12-27 DIAGNOSIS — O24410 Gestational diabetes mellitus in pregnancy, diet controlled: Secondary | ICD-10-CM

## 2019-12-27 DIAGNOSIS — O0990 Supervision of high risk pregnancy, unspecified, unspecified trimester: Secondary | ICD-10-CM

## 2019-12-27 DIAGNOSIS — Z20818 Contact with and (suspected) exposure to other bacterial communicable diseases: Secondary | ICD-10-CM

## 2019-12-27 NOTE — Progress Notes (Signed)
Prenatal Visit Note Date: 12/27/2019 Clinic: [facility]  Subjective:  Claudia Rogers is a 34 y.o. [phone] at [redacted]w[redacted]d being seen today for ongoing prenatal care.  She is currently monitored for the following issues for this high-risk pregnancy and has History of meningitis; Vaginal bleeding in pregnancy, first trimester; Supervision of high risk pregnancy, antepartum; and GDM (gestational diabetes mellitus), class A1 on their problem list.  Patient reports no complaints.   Contractions: Irritability. Vag. Bleeding: None.  Movement: Present. Denies leaking of fluid.   The following portions of the patient's history were reviewed and updated as appropriate: allergies, current medications, past family history, past medical history, past social history, past surgical history and problem list. Problem list updated.  Objective:   Vitals:   12/27/19 1024  BP: 107/74  Pulse: 93  Weight: 189 lb (85.7 kg)    Fetal Status: Fetal Heart Rate (bpm): 152   Movement: Present     General:  Alert, oriented and cooperative. Patient is in no acute distress.  Skin: Skin is warm and dry. No rash noted.   Cardiovascular: Normal heart rate noted  Respiratory: Normal respiratory effort, no problems with respiration noted  Abdomen: Soft, gravid, appropriate for gestational age. Pain/Pressure: Absent     Pelvic:  Cervical exam deferred        Extremities: Normal range of motion.  Edema: None  Mental Status: Normal mood and affect. Normal behavior. Normal judgment and thought content.   Urinalysis:      Assessment and Plan:  Pregnancy: G4P1021 at [redacted]w[redacted]d  1. GDM (gestational diabetes mellitus), class A1 Doing well. Okay to check am fasting and a few others during the day. Normal growth, ac/afi on 8/12. Has rpt on 9/10. D/w her re: IOL at 39-40wks  2. Supervision of high risk pregnancy, antepartum gbs nv  3. Exposure to strep throat No s/s. Son has it and is getting treated. D/w her  re: UC eval  Preterm labor symptoms and general obstetric precautions including but not limited to vaginal bleeding, contractions, leaking of fluid and fetal movement were reviewed in detail with the patient. Please refer to After Visit Summary for other counseling recommendations.  Return in about 2 weeks (around 01/10/2020) for high risk, in person.   Partridge Bing, MD

## 2020-01-02 ENCOUNTER — Ambulatory Visit
Admission: EM | Admit: 2020-01-02 | Discharge: 2020-01-02 | Disposition: A | Discharge status: Home / Self Care | Payer: PRIVATE HEALTH INSURANCE | Attending: Emergency Medicine | Admitting: Emergency Medicine

## 2020-01-02 DIAGNOSIS — J020 Streptococcal pharyngitis: Secondary | ICD-10-CM | POA: Insufficient documentation

## 2020-01-02 DIAGNOSIS — J029 Acute pharyngitis, unspecified: Secondary | ICD-10-CM | POA: Insufficient documentation

## 2020-01-02 LAB — POCT RAPID STREP A (OFFICE): Rapid Strep A Screen: NEGATIVE

## 2020-01-02 MED ORDER — AMOXICILLIN 500 MG PO TABS: 500.0000 mg | ORAL_TABLET | Freq: Three times a day (TID) | ORAL | 0 refills | Status: DC

## 2020-01-02 NOTE — Discharge Instructions (Signed)
Take the amoxicillin as directed.  The throat culture is pending.  We will call you if it indicates the need to discontinue your antibiotic.    Follow up with your primary care provider or OB/GYN if your symptoms are not improving.

## 2020-01-02 NOTE — ED Triage Notes (Signed)
Pt reports having sore throat and Johannes spots in the back of her throat since last night. Denies chills, fever.Reports her son have Strep.

## 2020-01-02 NOTE — ED Provider Notes (Signed)
Claudia Rogers    CSN: [id number] Arrival date & time: 01/02/20  1735      History   Chief Complaint Chief Complaint  Patient presents with   Sore Throat    HPI Claudia Rogers is a 34 y.o. female.   Patient presents with a sore throat since last night.  Her son was diagnosed with strep last week.  Patient is currently [redacted] weeks pregnant.  She denies fever, chills, cough, shortness of breath, vomiting, diarrhea, rash, or other symptoms.  No treatment attempted at home.  The history is provided by the patient.    Past Medical History:  Diagnosis Date   Anxiety    H/O viral meningitis    History of chicken pox     Patient Active Problem List   Diagnosis Date Noted   GDM (gestational diabetes mellitus), class A1 11/10/2019   Supervision of high risk pregnancy, antepartum 08/17/2019   Vaginal bleeding in pregnancy, first trimester 06/10/2019   History of meningitis 05/12/2008    Past Surgical History:  Procedure Laterality Date   EYE MUSCLE SURGERY Bilateral 1996   EYE MUSCLE SURGERY Bilateral 2000   Odontoma Removal/implant  2003   WISDOM TOOTH EXTRACTION      OB History    Gravida  4   Para  1   Term  1   Preterm      AB  2   Living  1     SAB  2   TAB      Ectopic      Multiple      Live Births  1            Home Medications    Prior to Admission medications   Medication Sig Start Date End Date Taking? Authorizing Provider  Accu-Chek Softclix Lancets lancets 1 each by Other route 4 (four) times daily. 11/10/19   Caren Macadam, MD  amoxicillin (AMOXIL) 500 MG tablet Take 1 tablet (500 mg total) by mouth 3 (three) times daily. 01/02/20   Sharion Balloon, NP  Blood Glucose Monitoring Suppl (ACCU-CHEK GUIDE) w/Device KIT 1 Device by Does not apply route 4 (four) times daily. 11/15/19   Caren Macadam, MD  Blood Glucose Monitoring Suppl ([place]) w/Device KIT 1 kit by Subdermal route as directed.  Check blood sugars for fasting, and two hours after breakfast, lunch and dinner (4 checks daily) 11/10/19   Caren Macadam, MD  cyclobenzaprine (FLEXERIL) 10 MG tablet Take 1 tablet (10 mg total) by mouth every 8 (eight) hours as needed for muscle spasms. Patient not taking: Reported on 11/09/2019 10/12/19   Caren Macadam, MD  escitalopram (LEXAPRO) 20 MG tablet Take 1 tablet (20 mg total) by mouth daily. 10/11/19   Donnamae Jude, MD  fluticasone (FLONASE SENSIMIST) 27.5 MCG/SPRAY nasal spray Place 2 sprays into the nose daily. Patient not taking: Reported on 09/14/2019 08/08/19   Donnamae Jude, MD  glucose blood (ACCU-CHEK SMARTVIEW) test strip Use as instructed to check blood sugars 11/10/19   Caren Macadam, MD  loratadine (CLARITIN) 10 MG tablet Take 10 mg by mouth daily. Patient not taking: Reported on 11/09/2019    [provider]  [place]. Devices (BREAST PUMP) MISC Dispense one breast pump for patient 12/19/19   Donnamae Jude, MD  Prenatal Vit-Fe Fumarate-FA (PRENATAL VITAMIN) 27-0.8 MG TABS Prenatal Vitamin    [provider]    Family History Family History  Problem  Relation Age of Onset   Arthritis Mother    High blood pressure Mother    Diabetes Mother        Type II   Lung cancer Father    Arthritis Maternal Grandmother    Stroke Maternal Grandmother    High blood pressure Maternal Grandmother    Diabetes Maternal Grandmother        Type II   Alcoholism Maternal Aunt    Alcoholism Maternal Uncle    Alcoholism Cousin    Lung cancer Paternal Aunt    High blood pressure Sister    Kidney disease Maternal Aunt    Mental illness Sister    Diabetes Brother        Type II-BKA   Heart disease Neg Hx    Cancer Neg Hx     Social History Social History   Tobacco Use   Smoking status: Never Smoker   Smokeless tobacco: Never Used  Scientific laboratory technician Use: Never used  Substance Use Topics   Alcohol use: Not Currently     Alcohol/week: 0.0 standard drinks    Comment: Social   Drug use: No     Allergies   Patient has no known allergies.   Review of Systems Review of Systems  Constitutional: Negative for chills and fever.  HENT: Positive for sore throat. Negative for ear pain.   Eyes: Negative for pain and visual disturbance.  Respiratory: Negative for cough and shortness of breath.   Cardiovascular: Negative for chest pain and palpitations.  Gastrointestinal: Negative for abdominal pain, diarrhea and vomiting.  Genitourinary: Negative for dysuria and hematuria.  Musculoskeletal: Negative for arthralgias and back pain.  Skin: Negative for color change and rash.  Neurological: Negative for seizures and syncope.  All other systems reviewed and are negative.    Physical Exam Triage Vital Signs ED Triage Vitals  Enc Vitals Group     BP      Pulse      Resp      Temp      Temp src      SpO2      Weight      Height      Head Circumference      Peak Flow      Pain Score      Pain Loc      Pain Edu?      Excl. in [place]?    No data found.  Updated Vital Signs BP 103/69 (BP Location: Left Arm)    Pulse 82    Temp 97.8 F (36.6 C) (Temporal)    Resp 18    LMP 03/01/2019 (Exact Date)    SpO2 97%   Visual Acuity Right Eye Distance:   Left Eye Distance:   Bilateral Distance:    Right Eye Near:   Left Eye Near:    Bilateral Near:     Physical Exam Vitals and nursing note reviewed.  Constitutional:      General: She is not in acute distress.    Appearance: She is well-developed. She is not ill-appearing.  HENT:     Head: Normocephalic and atraumatic.     Right Ear: Tympanic membrane normal.     Left Ear: Tympanic membrane normal.     Nose: Nose normal.     Mouth/Throat:     Mouth: Mucous membranes are moist.     Pharynx: Posterior oropharyngeal erythema present.     Tonsils: 0 on the right. 1+ on  the left.  Eyes:     Conjunctiva/sclera: Conjunctivae normal.  Cardiovascular:      Rate and Rhythm: Normal rate and regular rhythm.     Heart sounds: No murmur heard.   Pulmonary:     Effort: Pulmonary effort is normal. No respiratory distress.     Breath sounds: Normal breath sounds.  Abdominal:     Palpations: Abdomen is soft.     Tenderness: There is no abdominal tenderness.  Musculoskeletal:     Cervical back: Neck supple.  Skin:    General: Skin is warm and dry.     Findings: No rash.  Neurological:     General: No focal deficit present.     Mental Status: She is alert and oriented to person, place, and time.     Gait: Gait normal.  Psychiatric:        Mood and Affect: Mood normal.        Behavior: Behavior normal.      UC Treatments / Results  Labs (all labs ordered are listed, but only abnormal results are displayed) Labs Reviewed  CULTURE, GROUP A STREP [facility])  POCT RAPID STREP A (OFFICE)    EKG   Radiology No results found.  Procedures Procedures (including critical care time)  Medications Ordered in UC Medications - No data to display  Initial Impression / Assessment and Plan / UC Course  I have reviewed the triage vital signs and the nursing notes.  Pertinent labs & imaging results that were available during my care of the patient were reviewed by me and considered in my medical decision making (see chart for details).   Acute pharyngitis.  Patient's son is currently being treated for strep.  Patient is currently [redacted] weeks pregnant.  Treating with amoxicillin.  Discussed with patient that if the throat culture is negative we will call her to discontinue treatment.  Instructed her to follow-up with her PCP or OB/GYN if her symptoms are not improving.  Patient agrees to plan of care.   Final Clinical Impressions(s) / UC Diagnoses   Final diagnoses:  Acute pharyngitis, unspecified etiology     Discharge Instructions     Take the amoxicillin as directed.  The throat culture is pending.  We will call you if it indicates the need to  discontinue your antibiotic.    Follow up with your primary care provider or OB/GYN if your symptoms are not improving.       ED Prescriptions    Medication Sig Dispense Auth. Provider   amoxicillin (AMOXIL) 500 MG tablet Take 1 tablet (500 mg total) by mouth 3 (three) times daily. 21 tablet Sharion Balloon, NP     PDMP not reviewed this encounter.   Sharion Balloon, NP 01/02/20 [phone]

## 2020-01-05 LAB — CULTURE, GROUP A STREP (THRC)

## 2020-01-10 ENCOUNTER — Encounter: Payer: Self-pay | Admitting: Obstetrics and Gynecology

## 2020-01-10 ENCOUNTER — Ambulatory Visit (INDEPENDENT_AMBULATORY_CARE_PROVIDER_SITE_OTHER): Payer: PRIVATE HEALTH INSURANCE | Admitting: Obstetrics and Gynecology

## 2020-01-10 ENCOUNTER — Other Ambulatory Visit: Payer: Self-pay

## 2020-01-10 ENCOUNTER — Other Ambulatory Visit (HOSPITAL_COMMUNITY)
Admission: RE | Admit: 2020-01-10 | Discharge: 2020-01-10 | Disposition: A | Discharge status: Home / Self Care | Payer: PRIVATE HEALTH INSURANCE | Source: Ambulatory Visit | Attending: Obstetrics and Gynecology | Admitting: Obstetrics and Gynecology

## 2020-01-10 VITALS — BP 113/71 | HR 94 | Wt 191.0 lb

## 2020-01-10 DIAGNOSIS — O0990 Supervision of high risk pregnancy, unspecified, unspecified trimester: Secondary | ICD-10-CM

## 2020-01-10 DIAGNOSIS — O24410 Gestational diabetes mellitus in pregnancy, diet controlled: Secondary | ICD-10-CM

## 2020-01-10 NOTE — Progress Notes (Signed)
   PRENATAL VISIT NOTE  Subjective:  Claudia Rogers is a 34 y.o. G4P1021 at [redacted]w[redacted]d being seen today for ongoing prenatal care.  She is currently monitored for the following issues for this high-risk pregnancy and has History of meningitis; Vaginal bleeding in pregnancy, first trimester; Supervision of high risk pregnancy, antepartum; and GDM (gestational diabetes mellitus), class A1 on their problem list.  Patient reports no complaints.  Contractions: Irregular. Vag. Bleeding: None.  Movement: Present. Denies leaking of fluid.   The following portions of the patient's history were reviewed and updated as appropriate: allergies, current medications, past family history, past medical history, past social history, past surgical history and problem list.   Objective:   Vitals:   01/10/20 0819  BP: 113/71  Pulse: 94  Weight: 191 lb (86.6 kg)    Fetal Status: Fetal Heart Rate (bpm): 121 Fundal Height: 37 cm Movement: Present     General:  Alert, oriented and cooperative. Patient is in no acute distress.  Skin: Skin is warm and dry. No rash noted.   Cardiovascular: Normal heart rate noted  Respiratory: Normal respiratory effort, no problems with respiration noted  Abdomen: Soft, gravid, appropriate for gestational age.  Pain/Pressure: Absent     Pelvic: Cervical exam performed in the presence of a chaperone Dilation: 1 Effacement (%): Thick Station: Ballotable  Extremities: Normal range of motion.  Edema: None  Mental Status: Normal mood and affect. Normal behavior. Normal judgment and thought content.   Assessment and Plan:  Pregnancy: G4P1021 at [redacted]w[redacted]d 1. Supervision of high risk pregnancy, antepartum Patient is doing well without complaints Cultures today Patient remain undecided on contraception - Culture, beta strep (group b only) - Cervicovaginal ancillary only( [place])  2. GDM (gestational diabetes mellitus), class A1 CBGs reviewed and within range Continue diet  control Follow up growth ultrasound 9/10 IOL 39-40 weeks to be scheduled at a later visit  Preterm labor symptoms and general obstetric precautions including but not limited to vaginal bleeding, contractions, leaking of fluid and fetal movement were reviewed in detail with the patient. Please refer to After Visit Summary for other counseling recommendations.   Return in about 1 week (around 01/17/2020) for in person, ROB, High risk.  Future Appointments  Date Time Provider Department Center  01/17/2020 10:45 AM Reva Bores, MD [facility]  01/20/2020  9:45 AM [facility] NURSE [facility]  01/20/2020 10:00 AM [facility] US1 [facility]  01/24/2020  8:45 AM Anyanwu, Jethro Bastos, MD [facility]  01/31/2020  9:00 AM Reva Bores, MD [facility]    Catalina Antigua, MD

## 2020-01-11 LAB — GC/CHLAMYDIA PROBE AMP (~~LOC~~) NOT AT ARMC
Chlamydia: NEGATIVE
Comment: NEGATIVE
Comment: NORMAL
Neisseria Gonorrhea: NEGATIVE

## 2020-01-13 LAB — CULTURE, BETA STREP (GROUP B ONLY): Strep Gp B Culture: NEGATIVE

## 2020-01-17 ENCOUNTER — Encounter: Payer: PRIVATE HEALTH INSURANCE | Admitting: Family Medicine

## 2020-01-17 ENCOUNTER — Ambulatory Visit: Payer: PRIVATE HEALTH INSURANCE

## 2020-01-17 ENCOUNTER — Ambulatory Visit (INDEPENDENT_AMBULATORY_CARE_PROVIDER_SITE_OTHER): Payer: PRIVATE HEALTH INSURANCE | Admitting: Family Medicine

## 2020-01-17 ENCOUNTER — Other Ambulatory Visit: Payer: Self-pay

## 2020-01-17 VITALS — BP 107/75 | HR 99 | Wt 195.0 lb

## 2020-01-17 DIAGNOSIS — O24410 Gestational diabetes mellitus in pregnancy, diet controlled: Secondary | ICD-10-CM

## 2020-01-17 DIAGNOSIS — O0990 Supervision of high risk pregnancy, unspecified, unspecified trimester: Secondary | ICD-10-CM

## 2020-01-17 NOTE — Progress Notes (Signed)
   PRENATAL VISIT NOTE  Subjective:  Claudia Rogers is a 34 y.o. G4P1021 at [redacted]w[redacted]d being seen today for ongoing prenatal care.  She is currently monitored for the following issues for this high-risk pregnancy and has History of meningitis; Vaginal bleeding in pregnancy, first trimester; Supervision of high risk pregnancy, antepartum; and GDM (gestational diabetes mellitus), class A1 on their problem list.  Patient reports contractions since last few days with LOF.  Contractions: Irregular. Vag. Bleeding: None.  Movement: Present. Denies leaking of fluid.   The following portions of the patient's history were reviewed and updated as appropriate: allergies, current medications, past family history, past medical history, past social history, past surgical history and problem list.   Objective:   Vitals:   01/17/20 1057  BP: 107/75  Pulse: 99  Weight: 195 lb (88.5 kg)    Fetal Status:     Movement: Present  Presentation: Vertex  General:  Alert, oriented and cooperative. Patient is in no acute distress.  Skin: Skin is warm and dry. No rash noted.   Cardiovascular: Normal heart rate noted  Respiratory: Normal respiratory effort, no problems with respiration noted  Abdomen: Soft, gravid, appropriate for gestational age.  Pain/Pressure: Absent     Pelvic: Cervical exam performed in the presence of a chaperone Dilation: Closed Effacement (%): Thick Station: Ballotable neg pooling, neg nitrazine  Extremities: Normal range of motion.  Edema: None  Mental Status: Normal mood and affect. Normal behavior. Normal judgment and thought content.   Assessment and Plan:  Pregnancy: G4P1021 at [redacted]w[redacted]d 1. GDM (gestational diabetes mellitus), class A1 See babyscripts data, only checking fastings and evenings, which are mostly WNL For u/s for growth in 3 days  2. Supervision of high risk pregnancy, antepartum Likely prodromal labor No evidence of ROM on exam today--continue to monitor. GBS  negative.  Preterm labor symptoms and general obstetric precautions including but not limited to vaginal bleeding, contractions, leaking of fluid and fetal movement were reviewed in detail with the patient. Please refer to After Visit Summary for other counseling recommendations.   Return in 1 week (on 01/24/2020).  Future Appointments  Date Time Provider Department Center  01/20/2020  9:45 AM [facility] NURSE [facility]  01/20/2020 10:00 AM [facility] US1 [facility]  01/24/2020  8:45 AM Anyanwu, Jethro Bastos, MD [facility]  01/31/2020  6:30 AM [facility] SCHED ROOM [facility] None    Reva Bores, MD

## 2020-01-17 NOTE — Patient Instructions (Signed)

## 2020-01-18 ENCOUNTER — Encounter: Payer: PRIVATE HEALTH INSURANCE | Admitting: Student

## 2020-01-20 ENCOUNTER — Ambulatory Visit: Payer: PRIVATE HEALTH INSURANCE | Admitting: *Deleted

## 2020-01-20 ENCOUNTER — Other Ambulatory Visit: Payer: Self-pay

## 2020-01-20 ENCOUNTER — Ambulatory Visit: Payer: PRIVATE HEALTH INSURANCE | Attending: Obstetrics and Gynecology

## 2020-01-20 DIAGNOSIS — O0990 Supervision of high risk pregnancy, unspecified, unspecified trimester: Secondary | ICD-10-CM | POA: Diagnosis present

## 2020-01-20 DIAGNOSIS — Z362 Encounter for other antenatal screening follow-up: Secondary | ICD-10-CM

## 2020-01-20 DIAGNOSIS — Z3A37 37 weeks gestation of pregnancy: Secondary | ICD-10-CM | POA: Diagnosis not present

## 2020-01-20 DIAGNOSIS — O24410 Gestational diabetes mellitus in pregnancy, diet controlled: Secondary | ICD-10-CM | POA: Diagnosis not present

## 2020-01-20 DIAGNOSIS — O24419 Gestational diabetes mellitus in pregnancy, unspecified control: Secondary | ICD-10-CM | POA: Diagnosis not present

## 2020-01-24 ENCOUNTER — Encounter: Payer: Self-pay | Admitting: Obstetrics & Gynecology

## 2020-01-24 ENCOUNTER — Other Ambulatory Visit: Payer: Self-pay

## 2020-01-24 ENCOUNTER — Ambulatory Visit (INDEPENDENT_AMBULATORY_CARE_PROVIDER_SITE_OTHER): Payer: PRIVATE HEALTH INSURANCE | Admitting: Obstetrics & Gynecology

## 2020-01-24 VITALS — BP 116/77 | HR 89 | Wt 194.0 lb

## 2020-01-24 DIAGNOSIS — O0990 Supervision of high risk pregnancy, unspecified, unspecified trimester: Secondary | ICD-10-CM

## 2020-01-24 DIAGNOSIS — O24410 Gestational diabetes mellitus in pregnancy, diet controlled: Secondary | ICD-10-CM

## 2020-01-24 DIAGNOSIS — Z3A38 38 weeks gestation of pregnancy: Secondary | ICD-10-CM

## 2020-01-24 NOTE — Patient Instructions (Signed)
Return to office for any scheduled appointments. Call the office or go to the MAU at [facility] if:  You begin to have strong, frequent contractions  Your water breaks.  Sometimes it is a big gush of fluid, sometimes it is just a trickle that keeps getting your panties wet or running down your legs  You have vaginal bleeding.  It is normal to have a small amount of spotting if your cervix was checked.   You do not feel your baby moving like normal.  If you do not, get something to eat and drink and lay down and focus on feeling your baby move.   If your baby is still not moving like normal, you should call the office or go to MAU.  Any other obstetric concerns.  OUTPATIENT FOLEY BULB INDUCTION OF LABOR:  Information Sheet for Mothers and Family               What's a Foley Bulb Induction? A Foley bulb induction is a procedure where your provider inserts a catheter into your cervix. Once inside your womb, your provider inflates the balloon with a saline solution.   This puts pressure on your cervix and encourages dilation. The catheter falls out once your cervix dilates to 3-4 centimeters.     With any procedure, it's important that you know what to expect. The insertion of a Foley catheter can be a bit uncomfortable, and some women experience sharp pelvic pain. The pain may subside once the catheter is in place. You may experience some cramping when the Foley catheter is in place.  This is normal.     GO TO THE MATERNITY ADMISSIONS UNIT FOR THE FOLLOWING:  Heavy vaginal bleeding  Rupture of membranes (fluid that wets your underwear)  Painful uterine contractions every 5 minutes or less  Severe abdominal discomfort  Decreased movement of the baby     

## 2020-01-24 NOTE — Progress Notes (Signed)
PRENATAL VISIT NOTE  Subjective:  Claudia Rogers is a 34 y.o. G4P1021 at [redacted]w[redacted]d being seen today for ongoing prenatal care.  She is currently monitored for the following issues for this high-risk pregnancy and has History of meningitis; Vaginal bleeding in pregnancy, first trimester; Supervision of high risk pregnancy, antepartum; and GDM (gestational diabetes mellitus), class A1 on their problem list.  Patient reports occasional contractions.  Contractions: Irregular. Vag. Bleeding: None.  Movement: Present. Denies leaking of fluid.   The following portions of the patient's history were reviewed and updated as appropriate: allergies, current medications, past family history, past medical history, past social history, past surgical history and problem list.   Objective:   Vitals:   01/24/20 0902  BP: 116/77  Pulse: 89  Weight: 194 lb (88 kg)    Fetal Status: Fetal Heart Rate (bpm): 145 Fundal Height: 38 cm Movement: Present  Presentation: Vertex  General:  Alert, oriented and cooperative. Patient is in no acute distress.  Skin: Skin is warm and dry. No rash noted.   Cardiovascular: Normal heart rate noted  Respiratory: Normal respiratory effort, no problems with respiration noted  Abdomen: Soft, gravid, appropriate for gestational age.  Pain/Pressure: Present     Pelvic: Cervical exam performed in the presence of a chaperone Dilation: 1 Effacement (%): 50 Station: -3  Extremities: Normal range of motion.  Edema: None  Mental Status: Normal mood and affect. Normal behavior. Normal judgment and thought content.   [place] MFM OB FOLLOW UP  Result Date: 01/20/2020 ----------------------------------------------------------------------  OBSTETRICS REPORT                       (Signed Final 01/20/2020 05:14 pm) ---------------------------------------------------------------------- Patient Info  ID #:       [id number]                          D.O.B.:  Aug 17, 1985 (34 yrs)  Name:       [facility] R  Gantt                 Visit Date: 01/20/2020 10:14 am ---------------------------------------------------------------------- Performed By  Attending:        Ma Rings MD         Ref. Address:     [address]                                                             [address]  Performed By:     Sandi Mealy        [facility]  Referred By:      [facility] ---------------------------------------------------------------------- Orders  #  Description                           Code        Ordered By  1  [place] MFM OB FOLLOW UP                   [phone]    Noralee Space ----------------------------------------------------------------------  #  Order #                     Accession #                Episode #  1  [id number]                   [id number]                 [id number] ---------------------------------------------------------------------- Indications  Gestational diabetes in pregnancy, diet        O24.410  controlled  Encounter for other antenatal screening        Z36.2  follow-up  [redacted] weeks gestation of pregnancy                Z3A.37 ---------------------------------------------------------------------- Fetal Evaluation  Num Of Fetuses:         1  Fetal Heart Rate(bpm):  135  Cardiac Activity:       Observed  Presentation:           Cephalic  Placenta:               Posterior  P. Cord Insertion:      Previously Visualized  Amniotic Fluid  AFI FV:      Within normal limits  AFI Sum(cm)     %Tile       Largest Pocket(cm)  16.16           62          5.56  RUQ(cm)       RLQ(cm)       LUQ(cm)        LLQ(cm)  5.56          2.5           4.98           3.12 ---------------------------------------------------------------------- Biometry  BPD:      92.5  mm     G. Age:  37w 4d          73  %    CI:        76.24   %    70 - 86                                                          FL/HC:      21.3   %    20.8 - 22.6  HC:      335.7  mm     G. Age:  38w 3d         51  %    HC/AC:      0.98        0.92 - 1.05  AC:      341.1  mm     G. Age:  38w 0d         80  %    FL/BPD:     77.4   %    71 - 87  FL:       71.6  mm     G. Age:  36w 5d         31  %    FL/AC:      21.0   %    20 - 24  HUM:      64.5  mm     G. Age:  37w 3d         73  %  LV:        2.5  mm  Est. FW:    3283  gm      7 lb 4 oz     66  % ---------------------------------------------------------------------- OB History  Gravidity:    4         Term:   1        Prem:   0        SAB:   2  TOP:          0       Ectopic:  0        Living: 1 ---------------------------------------------------------------------- Gestational Age  LMP:           46w 3d        Date:  03/01/19                 EDD:   12/06/19  U/S Today:     37w 5d                                        EDD:   02/05/20  Best:          37w 3d     Det. By:  Marcella DubsEarly Ultrasound         EDD:   02/07/20                                      (06/23/19) ---------------------------------------------------------------------- Anatomy  Cranium:               Appears normal         Aortic Arch:            Previously seen  Cavum:                 Appears normal         Ductal Arch:            Previously seen  Ventricles:            Appears normal         Diaphragm:              Appears normal  Choroid Plexus:        Previously seen  Stomach:                Appears normal, left                                                                        sided  Cerebellum:            Previously seen        Abdomen:                Appears normal  Posterior Fossa:       Previously seen        Abdominal Wall:         Previously seen  Nuchal Fold:           Not applicable (>20    Cord Vessels:           Previously seen                         wks GA)  Face:                  Orbits and profile      Kidneys:                Appear normal                         previously seen  Lips:                  Previously seen        Bladder:                Appears normal  Thoracic:              Appears normal         Spine:                  Previously seen  Heart:                 Previously seen        Upper Extremities:      Previously seen  RVOT:                  Previously seen        Lower Extremities:      Previously seen  LVOT:                  Previously seen  Other:  Heels/feet and open hands/5th digits previously visualized. Nasal          bone prev visualized. ---------------------------------------------------------------------- Comments  This patient was seen for a follow up growth scan due to diet-  controlled gestational diabetes.  She denies any problems  since her last exam.  She was informed that the fetal growth and amniotic fluid  level appears appropriate for her gestational age.  Due to gestational diabetes, she already has an induction of  labor scheduled on January 31, 2020. ----------------------------------------------------------------------                   Ma Rings, MD Electronically Signed Final Report   01/20/2020 05:14 pm ----------------------------------------------------------------------  Assessment and Plan:  Pregnancy: G4P1021 at [redacted]w[redacted]d 1. GDM (gestational diabetes mellitus), class A1 2. [redacted] weeks gestation of pregnancy 3. Supervision of high risk pregnancy, antepartum BG stable, continue diet. EFW 66%.  IOL scheduled at 39 weeks.  Induction of labor scheduled, orders have been signed and held. Offered outpatient foley placement for cervical ripening, she agreed to this, it will be scheduled accordingly and information was given to her to review at home.   Term labor symptoms and general obstetric precautions including but not limited to vaginal bleeding, contractions, leaking of fluid and fetal movement were reviewed in detail with the patient. Please refer to After  Visit Summary for other counseling recommendations.   Return in about 6 days (around 01/30/2020) for Oupatient foley bulb placement, NST.  Future Appointments  Date Time Provider Department Center  01/31/2020  6:30 AM [facility] SCHED ROOM [facility] None    Jaynie Collins, MD

## 2020-01-25 ENCOUNTER — Telehealth (HOSPITAL_COMMUNITY): Payer: Self-pay | Admitting: *Deleted

## 2020-01-25 ENCOUNTER — Encounter (HOSPITAL_COMMUNITY): Payer: Self-pay | Admitting: *Deleted

## 2020-01-25 NOTE — Telephone Encounter (Signed)
Preadmission screen  

## 2020-01-26 ENCOUNTER — Other Ambulatory Visit (HOSPITAL_COMMUNITY): Payer: Self-pay | Admitting: Advanced Practice Midwife

## 2020-01-30 ENCOUNTER — Other Ambulatory Visit: Payer: Self-pay

## 2020-01-30 ENCOUNTER — Other Ambulatory Visit (HOSPITAL_COMMUNITY)
Admission: RE | Admit: 2020-01-30 | Discharge: 2020-01-30 | Disposition: A | Discharge status: Home / Self Care | Payer: PRIVATE HEALTH INSURANCE | Source: Ambulatory Visit | Attending: Family Medicine | Admitting: Family Medicine

## 2020-01-30 ENCOUNTER — Encounter: Payer: Self-pay | Admitting: Obstetrics & Gynecology

## 2020-01-30 ENCOUNTER — Ambulatory Visit (INDEPENDENT_AMBULATORY_CARE_PROVIDER_SITE_OTHER): Payer: PRIVATE HEALTH INSURANCE | Admitting: Obstetrics & Gynecology

## 2020-01-30 ENCOUNTER — Other Ambulatory Visit (HOSPITAL_COMMUNITY): Payer: Self-pay | Admitting: Advanced Practice Midwife

## 2020-01-30 VITALS — BP 119/75 | HR 94 | Wt 196.0 lb

## 2020-01-30 DIAGNOSIS — Z20822 Contact with and (suspected) exposure to covid-19: Secondary | ICD-10-CM | POA: Insufficient documentation

## 2020-01-30 DIAGNOSIS — Z01812 Encounter for preprocedural laboratory examination: Secondary | ICD-10-CM | POA: Insufficient documentation

## 2020-01-30 DIAGNOSIS — Z3A38 38 weeks gestation of pregnancy: Secondary | ICD-10-CM

## 2020-01-30 DIAGNOSIS — Z3490 Encounter for supervision of normal pregnancy, unspecified, unspecified trimester: Secondary | ICD-10-CM

## 2020-01-30 LAB — SARS CORONAVIRUS 2 (TAT 6-24 HRS): SARS Coronavirus 2: NEGATIVE

## 2020-01-30 NOTE — Patient Instructions (Signed)

## 2020-01-30 NOTE — Progress Notes (Signed)
    PRENATAL VISIT NOTE  Subjective:  Claudia Rogers is a 34 y.o. G4P1021 at [redacted]w[redacted]d being seen today for ongoing prenatal care.  She is currently monitored for the following issues for this high-risk pregnancy and has History of meningitis; Vaginal bleeding in pregnancy, first trimester; Supervision of high risk pregnancy, antepartum; and GDM (gestational diabetes mellitus), class A1 on their problem list.  Patient reports no complaints.  Contractions: Not present. Vag. Bleeding: None.  Movement: Present. Denies leaking of fluid.   The following portions of the patient's history were reviewed and updated as appropriate: allergies, current medications, past family history, past medical history, past social history, past surgical history and problem list. Problem list updated.  Objective:   Vitals:   01/30/20 1505  BP: 119/75  Pulse: 94  Weight: 196 lb (88.9 kg)    Fetal Status: Fetal Heart Rate (bpm): NST   Movement: Present     General:  Alert, oriented and cooperative. Patient is in no acute distress.  Skin: Skin is warm and dry. No rash noted.   Cardiovascular: Normal heart rate noted  Respiratory: Normal respiratory effort, no problems with respiration noted  Abdomen: Soft, gravid, appropriate for gestational age.  Pain/Pressure: Present     Pelvic: Cervical exam performed in the presence of chaperone 1/50/-3        Extremities: Normal range of motion.  Edema: None  Mental Status:  Normal mood and affect. Normal behavior. Normal judgment and thought content.  Procedure: Patient informed of R/B/A of procedure. NST was performed and was reactive prior to procedure. NST:  EFM: Baseline: 145 bpm, Variability: Good {> 6 bpm), Accelerations: Reactive and Decelerations: Absent Toco: none Procedure done to begin ripening of the cervix prior to admission for induction of labor. Appropriate time out taken. The patient was placed in the lithotomy position and the cervix brought into view  with sterile speculum. A ring forcep was used to guide the 63F foley through the internal os of the cervix. Foley Balloon filled with 60cc of sterile water. Plug inserted into end of the foley. Foley placed on tension and taped to medial thigh.  NST:  EFM Baseline: 130 bpm, Variability: Good {> 6 bpm), Accelerations: Reactive and Decelerations: Absent  Toco: none There were no signs of tachysystole or hypertonus. All equipment was removed and accounted for. The patient tolerated the procedure well.  Assessment and Plan:  Pregnancy: G4P1021 at [redacted]w[redacted]d 1. [redacted] weeks gestation of pregnancy 2. Encounter for planned induction of labor S/p Outpatient placement of foley balloon catheter for cervical ripening. Induction of labor scheduled for tomorrow morning. Reassuring FHR tracing with no concerns at present. Warning signs given to patient to include return to MAU for heavy vaginal bleeding, Rupture of membranes, painful uterine contractions q 5 mins or less, severe abdominal discomfort, decreased fetal movement.  Return for Postpartum check.   Jaynie Collins, MD 01/30/2020 4:21 PM

## 2020-01-31 ENCOUNTER — Other Ambulatory Visit: Payer: Self-pay

## 2020-01-31 ENCOUNTER — Inpatient Hospital Stay (HOSPITAL_COMMUNITY): Payer: PRIVATE HEALTH INSURANCE | Admitting: Anesthesiology

## 2020-01-31 ENCOUNTER — Encounter (HOSPITAL_COMMUNITY): Payer: Self-pay | Admitting: Obstetrics & Gynecology

## 2020-01-31 ENCOUNTER — Inpatient Hospital Stay (HOSPITAL_COMMUNITY)
Admission: AD | Admit: 2020-01-31 | Discharge: 2020-02-02 | DRG: 807 | Disposition: A | Discharge status: Home / Self Care | Payer: PRIVATE HEALTH INSURANCE | Attending: Family Medicine | Admitting: Family Medicine

## 2020-01-31 ENCOUNTER — Inpatient Hospital Stay (HOSPITAL_COMMUNITY): Payer: PRIVATE HEALTH INSURANCE

## 2020-01-31 ENCOUNTER — Encounter: Payer: PRIVATE HEALTH INSURANCE | Admitting: Family Medicine

## 2020-01-31 DIAGNOSIS — Z20822 Contact with and (suspected) exposure to covid-19: Secondary | ICD-10-CM | POA: Diagnosis present

## 2020-01-31 DIAGNOSIS — O99344 Other mental disorders complicating childbirth: Secondary | ICD-10-CM | POA: Diagnosis present

## 2020-01-31 DIAGNOSIS — O24420 Gestational diabetes mellitus in childbirth, diet controlled: Principal | ICD-10-CM | POA: Diagnosis present

## 2020-01-31 DIAGNOSIS — F419 Anxiety disorder, unspecified: Secondary | ICD-10-CM | POA: Diagnosis present

## 2020-01-31 DIAGNOSIS — Z3A39 39 weeks gestation of pregnancy: Secondary | ICD-10-CM

## 2020-01-31 LAB — TYPE AND SCREEN
ABO/RH(D): A POS
Antibody Screen: NEGATIVE

## 2020-01-31 LAB — COMPREHENSIVE METABOLIC PANEL
ALT: 14 U/L (ref 0–44)
AST: 20 U/L (ref 15–41)
Albumin: 2.8 g/dL — ABNORMAL LOW (ref 3.5–5.0)
Alkaline Phosphatase: 106 U/L (ref 38–126)
Anion gap: 9 (ref 5–15)
BUN: 10 mg/dL (ref 6–20)
CO2: 19 mmol/L — ABNORMAL LOW (ref 22–32)
Calcium: 8.8 mg/dL — ABNORMAL LOW (ref 8.9–10.3)
Chloride: 108 mmol/L (ref 98–111)
Creatinine, Ser: 0.56 mg/dL (ref 0.44–1.00)
GFR calc Af Amer: >60 mL/min (ref >60)
GFR calc non Af Amer: >60 mL/min (ref >60)
Glucose, Bld: 106 mg/dL — ABNORMAL HIGH (ref 70–99)
Potassium: 4.1 mmol/L (ref 3.5–5.1)
Sodium: 136 mmol/L (ref 135–145)
Total Bilirubin: 0.5 mg/dL (ref 0.3–1.2)
Total Protein: 5.2 g/dL — ABNORMAL LOW (ref 6.5–8.1)

## 2020-01-31 LAB — GLUCOSE, CAPILLARY
Glucose-Capillary: 77 mg/dL (ref 70–99)
Glucose-Capillary: 81 mg/dL (ref 70–99)
Glucose-Capillary: 80 mg/dL (ref 70–99)

## 2020-01-31 LAB — CBC
HCT: 37.3 % (ref 36.0–46.0)
Hemoglobin: 12.5 g/dL (ref 12.0–15.0)
MCH: 31.3 pg (ref 26.0–34.0)
MCHC: 33.5 g/dL (ref 30.0–36.0)
MCV: 93.5 fL (ref 80.0–100.0)
Platelets: 310 10*3/uL (ref 150–400)
RBC: 3.99 MIL/uL (ref 3.87–5.11)
RDW: 12.8 % (ref 11.5–15.5)
WBC: 12.8 10*3/uL — ABNORMAL HIGH (ref 4.0–10.5)
nRBC: 0 % (ref 0.0–0.2)

## 2020-01-31 MED ORDER — FLEET ENEMA 7-19 GM/118ML RE ENEM: 1.0000 | ENEMA | Freq: Every day | RECTAL | Status: DC | PRN

## 2020-01-31 MED ORDER — LACTATED RINGERS IV SOLN: 500.0000 mL | Freq: Once | INTRAVENOUS | Status: DC

## 2020-01-31 MED ORDER — LIDOCAINE HCL (PF) 1 % IJ SOLN
INTRAMUSCULAR | Status: DC | PRN
  Administered 2020-01-31: 8 mL via EPIDURAL
  Administered 2020-01-31: 4 mL via EPIDURAL

## 2020-01-31 MED ORDER — HYDROXYZINE HCL 50 MG PO TABS: 50.0000 mg | ORAL_TABLET | Freq: Four times a day (QID) | ORAL | Status: DC | PRN

## 2020-01-31 MED ORDER — OXYTOCIN BOLUS FROM INFUSION
333.0000 mL | Freq: Once | INTRAVENOUS | Status: AC
  Administered 2020-02-01: 333 mL via INTRAVENOUS

## 2020-01-31 MED ORDER — OXYCODONE-ACETAMINOPHEN 5-325 MG PO TABS: 2.0000 | ORAL_TABLET | ORAL | Status: DC | PRN

## 2020-01-31 MED ORDER — LACTATED RINGERS IV SOLN
INTRAVENOUS | Status: DC
  Administered 2020-01-31 (×2): 125 mL/h via INTRAVENOUS

## 2020-01-31 MED ORDER — ONDANSETRON HCL 4 MG/2ML IJ SOLN
4.0000 mg | Freq: Four times a day (QID) | INTRAMUSCULAR | Status: DC | PRN
  Administered 2020-01-31: 4 mg via INTRAVENOUS
  Filled 2020-01-31: qty 2

## 2020-01-31 MED ORDER — LIDOCAINE HCL (PF) 1 % IJ SOLN: 30.0000 mL | INTRAMUSCULAR | Status: DC | PRN

## 2020-01-31 MED ORDER — EPHEDRINE 5 MG/ML INJ: 10.0000 mg | INTRAVENOUS | Status: DC | PRN

## 2020-01-31 MED ORDER — ACETAMINOPHEN 325 MG PO TABS: 650.0000 mg | ORAL_TABLET | ORAL | Status: DC | PRN

## 2020-01-31 MED ORDER — PHENYLEPHRINE 40 MCG/ML (10ML) SYRINGE FOR IV PUSH (FOR BLOOD PRESSURE SUPPORT)
80.0000 ug | PREFILLED_SYRINGE | INTRAVENOUS | Status: DC | PRN
  Filled 2020-01-31: qty 10

## 2020-01-31 MED ORDER — LACTATED RINGERS IV SOLN
500.0000 mL | INTRAVENOUS | Status: DC | PRN
  Administered 2020-01-31: 500 mL via INTRAVENOUS

## 2020-01-31 MED ORDER — TERBUTALINE SULFATE 1 MG/ML IJ SOLN: 0.2500 mg | Freq: Once | INTRAMUSCULAR | Status: DC | PRN

## 2020-01-31 MED ORDER — ZOLPIDEM TARTRATE 5 MG PO TABS: 5.0000 mg | ORAL_TABLET | Freq: Every evening | ORAL | Status: DC | PRN

## 2020-01-31 MED ORDER — SOD CITRATE-CITRIC ACID 500-334 MG/5ML PO SOLN: 30.0000 mL | ORAL | Status: DC | PRN

## 2020-01-31 MED ORDER — OXYCODONE-ACETAMINOPHEN 5-325 MG PO TABS: 1.0000 | ORAL_TABLET | ORAL | Status: DC | PRN

## 2020-01-31 MED ORDER — DIPHENHYDRAMINE HCL 50 MG/ML IJ SOLN: 12.5000 mg | INTRAMUSCULAR | Status: DC | PRN

## 2020-01-31 MED ORDER — MISOPROSTOL 25 MCG QUARTER TABLET: 25.0000 ug | ORAL_TABLET | ORAL | Status: DC | PRN

## 2020-01-31 MED ORDER — OXYTOCIN-SODIUM CHLORIDE 30-0.9 UT/500ML-% IV SOLN: 2.5000 [IU]/h | INTRAVENOUS | Status: DC

## 2020-01-31 MED ORDER — OXYTOCIN-SODIUM CHLORIDE 30-0.9 UT/500ML-% IV SOLN
1.0000 m[IU]/min | INTRAVENOUS | Status: DC
  Filled 2020-01-31: qty 500

## 2020-01-31 MED ORDER — PHENYLEPHRINE 40 MCG/ML (10ML) SYRINGE FOR IV PUSH (FOR BLOOD PRESSURE SUPPORT)
80.0000 ug | PREFILLED_SYRINGE | INTRAVENOUS | Status: DC | PRN
  Administered 2020-01-31 (×2): 80 ug via INTRAVENOUS

## 2020-01-31 MED ORDER — FENTANYL CITRATE (PF) 100 MCG/2ML IJ SOLN: 50.0000 ug | INTRAMUSCULAR | Status: DC | PRN

## 2020-01-31 MED ORDER — BUPIVACAINE HCL (PF) 0.75 % IJ SOLN
INTRAMUSCULAR | Status: DC | PRN
Start: 2020-01-31 — End: 2020-02-01
  Administered 2020-01-31: 12 mL/h via EPIDURAL

## 2020-01-31 MED ORDER — FENTANYL-BUPIVACAINE-NACL 0.5-0.125-0.9 MG/250ML-% EP SOLN
12.0000 mL/h | EPIDURAL | Status: DC | PRN
  Filled 2020-01-31: qty 250

## 2020-01-31 NOTE — Anesthesia Procedure Notes (Signed)
Epidural Patient location during procedure: OB Start time: 01/31/2020 5:22 PM End time: 01/31/2020 5:25 PM  Staffing Anesthesiologist: Leilani Able, MD Performed: anesthesiologist   Preanesthetic Checklist Completed: patient identified, IV checked, site marked, risks and benefits discussed, surgical consent, monitors and equipment checked, pre-op evaluation and timeout performed  Epidural Patient position: sitting Prep: DuraPrep and site prepped and draped Patient monitoring: continuous pulse ox and blood pressure Approach: midline Location: L3-L4 Injection technique: LOR air  Needle:  Needle type: Tuohy  Needle gauge: 17 G Needle length: 9 cm and 9 Needle insertion depth: 6 cm Catheter type: closed end flexible Catheter size: 19 Gauge Catheter at skin depth: 11 cm Test dose: negative and Other  Assessment Events: blood not aspirated, injection not painful, no injection resistance, no paresthesia and negative IV test  Additional Notes Reason for block:procedure for pain

## 2020-01-31 NOTE — Anesthesia Preprocedure Evaluation (Signed)
Anesthesia Evaluation  Patient identified by MRN, date of birth, ID band Patient awake    Reviewed: Allergy & Precautions, H&P , NPO status , Patient's Chart, lab work & pertinent test results  History of Anesthesia Complications Negative for: history of anesthetic complications  Airway Mallampati: I       Dental no notable dental hx.    Pulmonary    Pulmonary exam normal        Cardiovascular negative cardio ROS Normal cardiovascular exam     Neuro/Psych PSYCHIATRIC DISORDERS Anxiety negative neurological ROS     GI/Hepatic negative GI ROS, Neg liver ROS,   Endo/Other  diabetes, Gestational  Renal/GU negative Renal ROS  negative genitourinary   Musculoskeletal negative musculoskeletal ROS (+)   Abdominal Normal abdominal exam  (+)   Peds  Hematology negative hematology ROS (+)   Anesthesia Other Findings    Reproductive/Obstetrics (+) Pregnancy                             Anesthesia Physical  Anesthesia Plan  ASA: II  Anesthesia Plan: Epidural   Post-op Pain Management:    Induction:   PONV Risk Score and Plan:   Airway Management Planned:   Additional Equipment:   Intra-op Plan:   Post-operative Plan:   Informed Consent: I have reviewed the patients History and Physical, chart, labs and discussed the procedure including the risks, benefits and alternatives for the proposed anesthesia with the patient or authorized representative who has indicated his/her understanding and acceptance.       Plan Discussed with:   Anesthesia Plan Comments:         Anesthesia Quick Evaluation

## 2020-01-31 NOTE — Progress Notes (Signed)
Patient ID: Claudia Rogers, female   DOB: June 14, 1985, 34 y.o.   MRN: [id number] Claudia Rogers is a 34 y.o. P5T6144 at [redacted]w[redacted]d admitted for induction of labor due to A1DM.  Subjective: Comfortable w/ epidural, no complaints  Objective: BP 117/67   Pulse 72   Temp 98.3 F (36.8 C) (Oral)   Resp 18   Ht 5\' 3"  (1.6 m)   Wt 89.2 kg   LMP 03/01/2019 (Exact Date)   SpO2 100%   BMI 34.84 kg/m  No intake/output data recorded.  FHT:  FHR: 115 bpm, variability: moderate,  accelerations:  Present,  decelerations:  Absent UC:   regular, every 2-3 minutes  SVE:   Dilation: 5.5 Effacement (%): 70 Station: -1 Exam by:: [email or web address], CNM AROM mod amt clear fluid  Pitocin @ 12 mu/min  Labs: Lab Results  Component Value Date   WBC 12.8 (H) 01/31/2020   HGB 12.5 01/31/2020   HCT 37.3 01/31/2020   MCV 93.5 01/31/2020   PLT 310 01/31/2020   CBG (last 3)  Recent Labs    01/31/20 1621 01/31/20 2005  GLUCAP 77 81    Assessment / Plan: IOL d/t A1DM, sugars stable, s/p outpatient foley, progressing on pitocin, now AROM'd  Labor: Progressing normally Fetal Wellbeing:  Category I Pain Control:  Epidural Pre-eclampsia: n/a I/D:  GBS neg Anticipated MOD:  NSVD  02/02/20 CNM, WHNP-BC 01/31/2020, 8:54 PM

## 2020-01-31 NOTE — H&P (Addendum)
LABOR AND DELIVERY ADMISSION HISTORY AND PHYSICAL NOTE  Claudia Rogers is a 34 y.o. female [phone] with IUP at 37w0dby 7 wk UKoreapresenting for management of IOL due to A1GDM.  She reports positive fetal movement. She denies leakage of fluid, vaginal bleeding.   She plans on breast feeding. Her contraception plan is: unsure.  [facility] History/Complications[facility] , [facility], normal anatomy, cephalic presentation, posterior placenta, 66%ile, EFW 3283 (7 lb 4 oz)  Pregnancy complications:  -[id number]-anxiety, on lexapro  Past Medical History: Past Medical History:  Diagnosis Date  . Anxiety   . H/O viral meningitis   . History of chicken pox     Past Surgical History: Past Surgical History:  Procedure Laterality Date  . EYE MUSCLE SURGERY Bilateral 1996  . EYE MUSCLE SURGERY Bilateral 2000  . Odontoma Removal/implant  2003  . WISDOM TOOTH EXTRACTION      Obstetrical History: OB History    Gravida  4   Para  1   Term  1   Preterm      AB  2   Living  1     SAB  2   TAB      Ectopic      Multiple      Live Births  1           Social History: Social History   Socioeconomic History  . Marital status: Married    Spouse name: Not on file  . Number of children: Not on file  . Years of education: Not on file  . Highest education level: Not on file  Occupational History  . Not on file  Tobacco Use  . Smoking status: Never Smoker  . Smokeless tobacco: Never Used  Vaping Use  . Vaping Use: Never used  Substance and Sexual Activity  . Alcohol use: Not Currently    Alcohol/week: 0.0 standard drinks    Comment: Social  . Drug use: No  . Sexual activity: Yes    Birth control/protection: None  Other Topics Concern  . Not on file  Social History Narrative  . Not on file   Social Determinants of Health   Financial Resource Strain:   . Difficulty of Paying Living Expenses: Not on file  Food Insecurity:   . Worried About  RCharity fundraiserin the Last Year: Not on file  . Ran Out of Food in the Last Year: Not on file  Transportation Needs:   . Lack of Transportation (Medical): Not on file  . Lack of Transportation (Non-Medical): Not on file  Physical Activity:   . Days of Exercise per Week: Not on file  . Minutes of Exercise per Session: Not on file  Stress:   . Feeling of Stress : Not on file  Social Connections:   . Frequency of Communication with Friends and Family: Not on file  . Frequency of Social Gatherings with Friends and Family: Not on file  . Attends Religious Services: Not on file  . Active Member of Clubs or Organizations: Not on file  . Attends CArchivistMeetings: Not on file  . Marital Status: Not on file    Family History: Family History  Problem Relation Age of Onset  . Arthritis Mother   . High blood pressure Mother   . Diabetes Mother        Type II  . Lung cancer Father   . Arthritis Maternal Grandmother   .  Stroke Maternal Grandmother   . High blood pressure Maternal Grandmother   . Diabetes Maternal Grandmother        Type II  . Alcoholism Maternal Aunt   . Alcoholism Maternal Uncle   . Alcoholism Cousin   . Lung cancer Paternal Aunt   . High blood pressure Sister   . Kidney disease Maternal Aunt   . Mental illness Sister   . Diabetes Brother        Type II-BKA  . Heart disease Neg Hx   . Cancer Neg Hx     Allergies: No Known Allergies  Medications Prior to Admission  Medication Sig Dispense Refill Last Dose  . Accu-Chek Softclix Lancets lancets 1 each by Other route 4 (four) times daily. 120 each 12   . Blood Glucose Monitoring Suppl (ACCU-CHEK GUIDE) w/Device KIT 1 Device by Does not apply route 4 (four) times daily. 1 kit 0   . escitalopram (LEXAPRO) 20 MG tablet Take 1 tablet (20 mg total) by mouth daily. 90 tablet 3   . glucose blood (ACCU-CHEK SMARTVIEW) test strip Use as instructed to check blood sugars 100 each 12   . Misc. Devices  (BREAST PUMP) MISC Dispense one breast pump for patient 1 each 0   . Prenatal Vit-Fe Fumarate-FA (PRENATAL VITAMIN) 27-0.8 MG TABS Prenatal Vitamin        Review of Systems  All systems reviewed and negative except as stated in HPI  Physical Exam Blood pressure 121/66, pulse 80, temperature 98 F (36.7 C), temperature source Oral, resp. rate 18, height _0  (1.6 m), weight 89.2 kg, last menstrual period 03/01/2019, unknown if currently breastfeeding. General appearance: alert, oriented, NAD Lungs: normal respiratory effort Abdomen: soft, non-tender; gravid Presentation: vertex by SVE  Fetal monitoring: Baseline: 135 bpm, Variability: moderate, Accelerations: Reactive and Decelerations: Absent Uterine activity: Frequency: irregular and not well picked up on toco  Dilation: 4 Effacement (%): 70 Station: -2 Exam by:: MD Latasha Buczkowski  Prenatal labs: ABO, Rh: --/--/A POS (09/21 1220) Antibody: PENDING (09/21 1220) Rubella: 2.26 (03/10 1615) RPR: Non Reactive (06/30 0855)  HBsAg: Negative (03/10 1615)  HIV: Non Reactive (06/30 0859)  GC/Chlamydia: neg GBS: Negative/-- (08/31 0851)  2-hr GTT: fasting: 92 Genetic screening:  declined  Prenatal Transfer Tool  Maternal Diabetes: Yes:  Diabetes Type:  Diet controlled  Genetic Screening: Declined Maternal Ultrasounds: Normal Referrals:  None Maternal Substance Abuse:  No Significant Maternal Medications:  Meds include: Other:  lexapro Significant Maternal Lab Results: None  Results for orders placed or performed during the hospital encounter of 01/31/20 (from the past 24 hour(s))  Type and screen   Collection Time: 01/31/20 12:20 PM  Result Value Ref Range   ABO/RH(D) A POS    Antibody Screen PENDING    Sample Expiration      02/03/2020,2359 Performed at [facility], [place] [address] [place], [place] [address]   CBC   Collection Time: 01/31/20 12:30 PM  Result Value Ref Range   WBC 12.8 (H) 4.0 - 10.5 K/uL   RBC  3.99 3.87 - 5.11 MIL/uL   Hemoglobin 12.5 12.0 - 15.0 g/dL   HCT 37.3 36 - 46 %   MCV 93.5 80.0 - 100.0 fL   MCH 31.3 26.0 - 34.0 pg   MCHC 33.5 30.0 - 36.0 g/dL   RDW 12.8 11.5 - 15.5 %   Platelets 310 150 - 400 K/uL   nRBC 0.0 0.0 - 0.2 %  Results for orders placed  or performed during the hospital encounter of 01/30/20 (from the past 24 hour(s))  SARS CORONAVIRUS 2 (TAT 6-24 HRS) Nasopharyngeal Nasopharyngeal Swab   Collection Time: 01/30/20  2:18 PM   Specimen: Nasopharyngeal Swab  Result Value Ref Range   SARS Coronavirus 2 NEGATIVE NEGATIVE    Patient Active Problem List   Diagnosis Date Noted  . Gestational diabetes mellitus (GDM) in third trimester 01/31/2020  . GDM (gestational diabetes mellitus), class A1 11/10/2019  . Supervision of high risk pregnancy, antepartum 08/17/2019  . Vaginal bleeding in pregnancy, first trimester 06/10/2019  . History of meningitis 05/12/2008    Assessment: Claudia Rogers is a 34 y.o. G4P1021 at 66w0dhere for IOL due to A1GDM.   #Labor: S/p outpaitent FB (out 0100 on 01/31/20). Bishop score 8, so will start pitocin. Consider AROMed if/when appropriate. #Fetal Wellbeing:  Category I, EFW (8-8#8 extrapolated by UKorea 7#9 by Leopold's); pelvis proven to 7#4. SD precautions discussed. #Pain Control: Per patient request #GBS/ID: neg #COVID: neg #Circ: desires #Anticipated MOD: vaginal #contraception: unsure  ATalitha GivensMD, PGY-1 Family Medicine Resident, OB Faculty Teaching Service  01/31/2020, 1:00 PM   GME ATTESTATION:  I saw and evaluated the patient. I agree with the findings and the plan of care as documented in the resident's note with addition of the following:  Risks and benefits of induction were reviewed, including failure of method, prolonged labor, need for further intervention, risk of cesarean.  Patient and family seem to understand these risks and wish to proceed. Options of AROM and pitocin reviewed, with use of each  discussed.  HMerilyn Baba DO OB Fellow, [place]9/21/2021 1:27 PM

## 2020-01-31 NOTE — Progress Notes (Signed)
Patient ID: Claudia Rogers, female   DOB: June 30, 1985, 34 y.o.   MRN: [id number] Claudia Rogers is a 34 y.o. [id number] at [redacted]w[redacted]d admitted for induction of labor due to Gestational diabetes.  Subjective: Comfortable, no complaints  Objective: BP 117/68   Pulse 83   Temp 98.1 F (36.7 C) (Oral)   Resp 20   Ht 5\' 3"  (1.6 m)   Wt 89.2 kg   LMP 03/01/2019 (Exact Date)   SpO2 98%   BMI 34.84 kg/m  Total I/O In: -  Out: 350 [Urine:350]  FHT:  FHR: 130 bpm, variability: moderate,  accelerations:  Present,  decelerations:  Absent UC:   regular, every 2-3 minutes  SVE:   Dilation: 10 Effacement (%): 100 Station: 0, Plus 1 Exam by:: [email or web address], RN  Pitocin @ 14 mu/min  Labs: Lab Results  Component Value Date   WBC 12.8 (H) 01/31/2020   HGB 12.5 01/31/2020   HCT 37.3 01/31/2020   MCV 93.5 01/31/2020   PLT 310 01/31/2020    Assessment / Plan: Induction of labor due to gestational diabetes,  progressing well on pitocin, complete, nurse had her try a few practice pushes w/o much movement of station, will labor down for a little bit  Labor: Progressing normally Fetal Wellbeing:  Category I Pain Control:  Epidural Pre-eclampsia: n/a I/D:  GBS neg Anticipated MOD:  NSVD  02/02/2020 CNM, WHNP-BC 01/31/2020, 11:56 PM

## 2020-01-31 NOTE — Progress Notes (Signed)
  Claudia Rogers is a 34 y.o. Y5W3893 at [redacted]w[redacted]d admitted for IOL 2/2 A1GDM  Subjective: Starting to feel cramping  Objective: BP 137/62   Pulse 87   Temp 98 F (36.7 C) (Oral)   Resp 16   Ht 5\' 3"  (1.6 m)   Wt 89.2 kg   LMP 03/01/2019 (Exact Date)   BMI 34.84 kg/m  No intake/output data recorded.  FHT:  FHR: 145 bpm, variability: moderate,  accelerations:  Present,  decelerations:  Absent UC:   regular, every 2-3 minutes  SVE:   Dilation: 5 Effacement (%): 70, 80 Station: -2 Exam by:: [email or web address] RN  Pitocin @ 8 mu/min  Labs: Lab Results  Component Value Date   WBC 12.8 (H) 01/31/2020   HGB 12.5 01/31/2020   HCT 37.3 01/31/2020   MCV 93.5 01/31/2020   PLT 310 01/31/2020    Assessment / Plan: 34 yo G4P1021 at 39.0 EGA here for IOL 2/2 A1GDM  Labor: S/p OP FB (out 0100 on 01/31/20). Now on 8 of pitocin. Considering AROM as she is just now starting to feel cramping. Fetal Wellbeing:  Category I Pain Control:  Per patient request A1GDM: CBG 77, continue CBG q4h, q2h when active I/D:  GBS neg Anticipated MOD:  vaginal  Marty Sadlowski L Sunil Hue DO OB Fellow, Faculty Practice 01/31/2020, 4:57 PM

## 2020-02-01 ENCOUNTER — Encounter (HOSPITAL_COMMUNITY): Payer: Self-pay | Admitting: Obstetrics & Gynecology

## 2020-02-01 DIAGNOSIS — O24420 Gestational diabetes mellitus in childbirth, diet controlled: Secondary | ICD-10-CM

## 2020-02-01 DIAGNOSIS — Z3A39 39 weeks gestation of pregnancy: Secondary | ICD-10-CM

## 2020-02-01 LAB — RPR: RPR Ser Ql: NONREACTIVE

## 2020-02-01 LAB — CBC
HCT: 37.4 % (ref 36.0–46.0)
Hemoglobin: 12.4 g/dL (ref 12.0–15.0)
MCH: 31.4 pg (ref 26.0–34.0)
MCHC: 33.2 g/dL (ref 30.0–36.0)
MCV: 94.7 fL (ref 80.0–100.0)
Platelets: 268 10*3/uL (ref 150–400)
RBC: 3.95 MIL/uL (ref 3.87–5.11)
RDW: 13 % (ref 11.5–15.5)
WBC: 17.5 10*3/uL — ABNORMAL HIGH (ref 4.0–10.5)
nRBC: 0 % (ref 0.0–0.2)

## 2020-02-01 MED ORDER — ONDANSETRON HCL 4 MG PO TABS: 4.0000 mg | ORAL_TABLET | ORAL | Status: DC | PRN

## 2020-02-01 MED ORDER — ZOLPIDEM TARTRATE 5 MG PO TABS: 5.0000 mg | ORAL_TABLET | Freq: Every evening | ORAL | Status: DC | PRN

## 2020-02-01 MED ORDER — DIPHENHYDRAMINE HCL 25 MG PO CAPS: 25.0000 mg | ORAL_CAPSULE | Freq: Four times a day (QID) | ORAL | Status: DC | PRN

## 2020-02-01 MED ORDER — IBUPROFEN 600 MG PO TABS
600.0000 mg | ORAL_TABLET | Freq: Four times a day (QID) | ORAL | Status: DC
  Administered 2020-02-01 – 2020-02-02 (×6): 600 mg via ORAL
  Filled 2020-02-01 (×6): qty 1

## 2020-02-01 MED ORDER — SENNOSIDES-DOCUSATE SODIUM 8.6-50 MG PO TABS
2.0000 | ORAL_TABLET | ORAL | Status: DC
  Administered 2020-02-01: 2 via ORAL
  Filled 2020-02-01: qty 2

## 2020-02-01 MED ORDER — SODIUM CHLORIDE 0.9% FLUSH: 3.0000 mL | Freq: Two times a day (BID) | INTRAVENOUS | Status: DC

## 2020-02-01 MED ORDER — SIMETHICONE 80 MG PO CHEW: 80.0000 mg | CHEWABLE_TABLET | ORAL | Status: DC | PRN

## 2020-02-01 MED ORDER — ESCITALOPRAM OXALATE 20 MG PO TABS
20.0000 mg | ORAL_TABLET | Freq: Every day | ORAL | Status: DC
  Administered 2020-02-02: 20 mg via ORAL
  Filled 2020-02-01: qty 1

## 2020-02-01 MED ORDER — FLEET ENEMA 7-19 GM/118ML RE ENEM: 1.0000 | ENEMA | Freq: Every day | RECTAL | Status: DC | PRN

## 2020-02-01 MED ORDER — DIBUCAINE (PERIANAL) 1 % EX OINT
1.0000 "application " | TOPICAL_OINTMENT | CUTANEOUS | Status: DC | PRN
  Administered 2020-02-02: 1 via RECTAL
  Filled 2020-02-01: qty 28

## 2020-02-01 MED ORDER — ACETAMINOPHEN 325 MG PO TABS
650.0000 mg | ORAL_TABLET | ORAL | Status: DC | PRN
  Administered 2020-02-01 – 2020-02-02 (×4): 650 mg via ORAL
  Filled 2020-02-01 (×4): qty 2

## 2020-02-01 MED ORDER — WITCH HAZEL-GLYCERIN EX PADS
1.0000 "application " | MEDICATED_PAD | CUTANEOUS | Status: DC | PRN
  Administered 2020-02-02: 1 via TOPICAL

## 2020-02-01 MED ORDER — PRENATAL MULTIVITAMIN CH
1.0000 | ORAL_TABLET | Freq: Every day | ORAL | Status: DC
  Administered 2020-02-01 – 2020-02-02 (×2): 1 via ORAL
  Filled 2020-02-01: qty 1

## 2020-02-01 MED ORDER — BENZOCAINE-MENTHOL 20-0.5 % EX AERO
1.0000 "application " | INHALATION_SPRAY | CUTANEOUS | Status: DC | PRN
  Administered 2020-02-01: 1 via TOPICAL
  Filled 2020-02-01: qty 56

## 2020-02-01 MED ORDER — OXYCODONE HCL 5 MG PO TABS
5.0000 mg | ORAL_TABLET | ORAL | Status: DC | PRN
  Administered 2020-02-01 – 2020-02-02 (×3): 5 mg via ORAL
  Filled 2020-02-01 (×3): qty 1

## 2020-02-01 MED ORDER — ONDANSETRON HCL 4 MG/2ML IJ SOLN: 4.0000 mg | INTRAMUSCULAR | Status: DC | PRN

## 2020-02-01 MED ORDER — SODIUM CHLORIDE 0.9% FLUSH: 3.0000 mL | INTRAVENOUS | Status: DC | PRN

## 2020-02-01 MED ORDER — SODIUM CHLORIDE 0.9 % IV SOLN: 250.0000 mL | INTRAVENOUS | Status: DC | PRN

## 2020-02-01 MED ORDER — MEASLES, MUMPS & RUBELLA VAC IJ SOLR: 0.5000 mL | Freq: Once | INTRAMUSCULAR | Status: DC

## 2020-02-01 MED ORDER — TETANUS-DIPHTH-ACELL PERTUSSIS 5-2.5-18.5 LF-MCG/0.5 IM SUSP: 0.5000 mL | Freq: Once | INTRAMUSCULAR | Status: DC

## 2020-02-01 MED ORDER — BISACODYL 10 MG RE SUPP: 10.0000 mg | Freq: Every day | RECTAL | Status: DC | PRN

## 2020-02-01 MED ORDER — COCONUT OIL OIL: 1.0000 "application " | TOPICAL_OIL | Status: DC | PRN

## 2020-02-01 NOTE — Discharge Summary (Signed)
Postpartum Discharge Summary      Patient Name: Claudia Rogers DOB: July 16, 1985 MRN: [id number]  Date of admission: 01/31/2020 Delivery date:02/01/2020  Delivering provider: Wells Guiles R  Date of discharge: 02/02/2020  Admitting diagnosis: Gestational diabetes mellitus (GDM) in third trimester [O24.419] Intrauterine pregnancy: [redacted]w[redacted]d    Secondary diagnosis:  Active Problems:   Gestational diabetes mellitus (GDM) in third trimester  Additional problems: none    Discharge diagnosis: Term Pregnancy Delivered and GDM A1                                       Post partum procedures:none Augmentation: AROM, Pitocin and OP Foley Complications: None  Hospital course: Induction of Labor With Vaginal Delivery   [name] admitted to the hospital 01/31/2020 for induction of labor.  Indication for induction: A1 DM.  [name] as follows: Membrane Rupture Time/Date: 8:42 PM ,01/31/2020   Delivery Method:Vaginal, Spontaneous  Episiotomy: None  Lacerations:  2nd degree  Details of delivery can be found in separate delivery note.  Patient had a routine postpartum course. Patient is discharged home 02/02/20.  Newborn Data: Birth date:02/01/2020  Birth time:12:37 AM  Gender:Female  Living status:Living  Apgars:7 ,8  Weight:3620 g   Magnesium Sulfate received: No BMZ received: No Rhophylac:N/A MMR:N/A T-DaP:declined prenatally Flu: N/A Transfusion:No  Physical exam  Vitals:   02/01/20 0820 02/01/20 1132 02/01/20 2019 02/02/20 0511  BP: 111/61 110/62 95/76 101/79  Pulse: 74 65 73 77  Resp: '18 17 15 18  ' Temp: 97.9 F (36.6 C) 98.6 F (37 C) 97.9 F (36.6 C) 98.1 F (36.7 C)  TempSrc: Oral Oral Oral Oral  SpO2: 99%   100%  Weight:      Height:       General: alert and cooperative Lochia: appropriate Uterine Fundus: firm Incision: N/A DVT Evaluation: No evidence of DVT seen on physical exam. Labs: Lab Results    Component Value Date   WBC 17.5 (H) 02/01/2020   HGB 12.4 02/01/2020   HCT 37.4 02/01/2020   MCV 94.7 02/01/2020   PLT 268 02/01/2020   CMP Latest Ref Rng & Units 01/31/2020  Glucose 70 - 99 mg/dL 106(H)  BUN 6 - 20 mg/dL 10  Creatinine 0.44 - 1.00 mg/dL 0.56  Sodium 135 - 145 mmol/L 136  Potassium 3.5 - 5.1 mmol/L 4.1  Chloride 98 - 111 mmol/L 108  CO2 22 - 32 mmol/L 19(L)  Calcium 8.9 - 10.3 mg/dL 8.8(L)  Total Protein 6.5 - 8.1 g/dL 5.2(L)  Total Bilirubin 0.3 - 1.2 mg/dL 0.5  Alkaline Phos 38 - 126 U/L 106  AST 15 - 41 U/L 20  ALT 0 - 44 U/L 14   Edinburgh Score: Edinburgh Postnatal Depression Scale Screening Tool 02/01/2020  I have been able to laugh and see the funny side of things. (No Data)     After visit meds:  Allergies as of 02/02/2020   No Known Allergies     Medication List    STOP taking these medications   Accu-Chek Guide w/Device Kit   Accu-Chek SmartView test strip Generic drug: glucose blood   Accu-Chek Softclix Lancets lancets   Breast Pump Misc     TAKE these medications   escitalopram 20 MG tablet Commonly known as: LEXAPRO Take 1 tablet (20 mg total) by mouth daily.  ibuprofen 600 MG tablet Commonly known as: ADVIL Take 1 tablet (600 mg total) by mouth every 6 (six) hours as needed.   Prenatal Vitamin 27-0.8 MG Tabs Prenatal Vitamin        Discharge home in stable condition Infant Feeding: Breast Infant Disposition:home with mother Discharge instruction: per After Visit Summary and Postpartum booklet. Activity: Advance as tolerated. Pelvic rest for 6 weeks.  Diet: routine diet Future Appointments: Future Appointments  Date Time Provider [place]  03/13/2020  8:15 AM Aletha Halim, MD [facility]   Follow up Visit: Roma Schanz, [place] Please schedule this patient for PP visit in: 4 weeks  High risk pregnancy complicated by: GDM  Delivery mode: SVD  Anticipated  Birth Control: Condoms  PP Procedures needed: 2 hour GTT  Schedule Integrated [place] visit: no  Provider: Any provider    02/02/2020 Myrtis Ser, CNM  8:39 AM

## 2020-02-01 NOTE — Anesthesia Postprocedure Evaluation (Signed)
Anesthesia Post Note  Patient: Claudia Rogers  Procedure(s) Performed: AN AD HOC LABOR EPIDURAL     Patient location during evaluation: Mother Baby Anesthesia Type: Epidural Level of consciousness: awake and alert Pain management: pain level controlled Vital Signs Assessment: post-procedure vital signs reviewed and stable Respiratory status: spontaneous breathing, nonlabored ventilation and respiratory function stable Cardiovascular status: stable Postop Assessment: no headache, no backache, epidural receding, no apparent nausea or vomiting, patient able to bend at knees, adequate PO intake and able to ambulate Anesthetic complications: no   No complications documented.  Last Vitals:  Vitals:   02/01/20 0245 02/01/20 0345  BP: 124/63 110/63  Pulse:  72  Resp: 17 16  Temp: 37.2 C 36.9 C  SpO2: 98% 99%    Last Pain:  Vitals:   02/01/20 0345  TempSrc: Oral  PainSc: 3    Pain Goal:                   [place]

## 2020-02-01 NOTE — Progress Notes (Signed)
MOB was referred for history of depression/anxiety. * Referral screened out by Clinical Social Worker because none of the following criteria appear to apply: ~ History of anxiety/depression during this pregnancy, or of post-partum depression following prior delivery. ~ Diagnosis of anxiety and/or depression within last 3 years. Per further chart review, MOB diagnosed with anxiety in 2017.  OR * MOB's symptoms currently being treated with medication and/or therapy. Per further chart review, MOB on Lexapro for anxiety.    Please contact the Clinical Social Worker if needs arise, by MOB request, or if MOB scores greater than 9/yes to question 10 on Edinburgh Postpartum Depression Screen.    Aayra Hornbaker S. Keimora Swartout, MSW, LCSW [facility] [phone]   

## 2020-02-02 MED ORDER — IBUPROFEN 600 MG PO TABS: 600.0000 mg | ORAL_TABLET | Freq: Four times a day (QID) | ORAL | 1 refills | Status: DC | PRN

## 2020-02-02 NOTE — Lactation Note (Signed)
This note was copied from a baby's chart. Lactation Consultation Note  Patient Name: Boy Floraine Buechler [id number] Date: 02/02/2020 Reason for consult: Follow-up assessment;Term;Infant weight loss;Other (Comment) (8 % weight loss - Bili level WNL , see LC note for LC's recommendations)  Baby is 32 hours old  LC reviewed doc flow sheets and updated several voids and stools from yesterday.  Baby was latched on the right breast when LC entered  And had been feeding 10 mins and stayed latched with swallows for about 5 mins more.  Mom mentioned she had been sore on the left nipple , but it was better.  LC discussed 8 % weight loss and recommended prior to every latch - breast massage, hand express, prepump with the hand pump, and latch with firm support.  Also discussed nutritive vs non - nutritive feeding patterns and the importance of watching the baby for hanging out latched.   Sore nipple and engorgement prevention and tx. Mom already has a hand pump and a DEBP at home.  LC recommended due to the 8 % weight loss, after 4 feedings a day when the baby is not cluster feeding to pump both breast for 10 mins and supplement back to baby. STS feedings.  LC provided the [facility] pamphlet with phone numbers.   Maternal Data Has patient been taught Hand Expression?:  (per mom has been leaking the last few months) Does the patient have breastfeeding experience prior to this delivery?: Yes  Feeding Feeding Type:  (baby latched with depth and swallows)  LATCH Score Latch:  (latched with depth)  Audible Swallowing:  (swallows noted)  Type of Nipple:  (nipple slightly slanted when the baby released)  Comfort (Breast/Nipple):  (per mom comfortable)        Interventions Interventions: Breast feeding basics reviewed;Breast compression;Support pillows;Hand pump  Lactation Tools Discussed/Used Tools: Pump Breast pump type: Manual Pump Review: Milk Storage   Consult Status      Matilde Sprang  Nikkita Adeyemi 02/02/2020, 8:47 AM

## 2020-02-02 NOTE — Progress Notes (Signed)
Patient ID: Claudia Rogers, female   DOB: 04-10-1986, 34 y.o.   MRN: [id number] Attending Circumcision Counseling Progress Note  Patient desires circumcision for her female infant.  Circumcision procedure details discussed, risks and benefits of procedure were also discussed.  These include but are not limited to: Benefits of circumcision in men include reduction in the rates of urinary tract infection (UTI), penile cancer, some sexually transmitted infections, penile inflammatory and retractile disorders, as well as easier hygiene.  Risks include bleeding , infection, injury of glans which may lead to penile deformity or urinary tract issues, unsatisfactory cosmetic appearance and other potential complications related to the procedure.  It was emphasized that this is an elective procedure.  Patient wants to proceed with circumcision; written informed consent obtained.  Will do circumcision soon, routine circumcision and post circumcision care ordered for the infant.  Claudia Rogers L. Alysia Penna, M.D. 02/02/2020 9:27 AM

## 2020-02-02 NOTE — Discharge Instructions (Signed)

## 2020-02-02 NOTE — Progress Notes (Signed)
Patient complains that she has no sensation to empty her bladder. She states that she is going to the bathroom about every hour just so she doesn't have an accident; however, when she sits down on the toilet, urine just flows without her even trying. Patient states she is unable to stop a stream of urine in the middle of voiding. She also states she has to rush to the bathroom so she doesn't have an accident because once she stands up, urine begins to flow. Patient stated that she mentioned this to Philipp Deputy, CNM. This RN called Fanny Dance, OB CRNA, who stated that this did not seem to be an issue that anesthesia would need to assess the patient for. Earl Gala, Linda Hedges [place]

## 2020-02-13 ENCOUNTER — Telehealth: Payer: Self-pay | Admitting: *Deleted

## 2020-02-13 ENCOUNTER — Other Ambulatory Visit: Payer: Self-pay | Admitting: Obstetrics and Gynecology

## 2020-02-13 MED ORDER — METHYLERGONOVINE MALEATE 0.2 MG PO TABS: 0.2000 mg | ORAL_TABLET | Freq: Four times a day (QID) | ORAL | 0 refills | Status: AC

## 2020-02-13 NOTE — Telephone Encounter (Signed)
Pt called regarding her bleeding. Pt states her bleeding is more of a dark red color, and has not had anymore clots. Denies any fever, chills, SOB, dizziness.Advised pt to put on a clean pad and to let me know how her bleeding is over the next hour and I will reach out to a provider get POC. Pt verbalizes and understands.

## 2020-02-13 NOTE — Telephone Encounter (Signed)
Called pt to follow up on bleeding, pt states it much better and will continue to monitor and let us know if it becomes heavy again.

## 2020-02-13 NOTE — Telephone Encounter (Signed)
Attempted to call pt regarding her mychart message, unable to leave voicemail

## 2020-03-13 ENCOUNTER — Ambulatory Visit (INDEPENDENT_AMBULATORY_CARE_PROVIDER_SITE_OTHER): Payer: PRIVATE HEALTH INSURANCE | Admitting: Obstetrics and Gynecology

## 2020-03-13 ENCOUNTER — Other Ambulatory Visit: Payer: Self-pay

## 2020-03-13 VITALS — BP 108/70 | HR 89 | Wt 172.0 lb

## 2020-03-13 DIAGNOSIS — O24410 Gestational diabetes mellitus in pregnancy, diet controlled: Secondary | ICD-10-CM

## 2020-03-13 DIAGNOSIS — O24430 Gestational diabetes mellitus in the puerperium, diet controlled: Secondary | ICD-10-CM | POA: Diagnosis not present

## 2020-03-13 DIAGNOSIS — R32 Unspecified urinary incontinence: Secondary | ICD-10-CM

## 2020-03-13 DIAGNOSIS — N952 Postmenopausal atrophic vaginitis: Secondary | ICD-10-CM

## 2020-03-13 DIAGNOSIS — O99893 Other specified diseases and conditions complicating puerperium: Secondary | ICD-10-CM

## 2020-03-13 MED ORDER — ESTRADIOL 0.1 MG/GM VA CREA: 1.0000 g | TOPICAL_CREAM | Freq: Every day | VAGINAL | 2 refills | Status: DC

## 2020-03-13 MED ORDER — ESTRADIOL 0.1 MG/GM VA CREA: 1.0000 g | TOPICAL_CREAM | Freq: Every day | VAGINAL | 2 refills | Status: AC

## 2020-03-13 NOTE — Progress Notes (Signed)
    Post Partum Visit Note  Claudia Rogers is a 34 y.o. [phone] female who presents for a postpartum visit. She is s/p 9/22 SVD/2nd degree @ 39wks  I have fully reviewed the prenatal and intrapartum course.   Patient having issues with her bladder. It is getting better but will just have urine come out. No SUI or OAB s/s. She also has incomplete voiding.   Anesthesia: epidural. Postpartum course has been uncomplicated . Baby is doing well. Baby is feeding by breast. Bleeding no bleeding. Bowel function is normal.  Patient is not sexually active. Contraception method is condoms. Postpartum depression screening: negative.     Edinburgh Postnatal Depression Scale - 03/13/20 0840      Edinburgh Postnatal Depression Scale:  In the Past 7 Days   I have been able to laugh and see the funny side of things. 0    I have looked forward with enjoyment to things. 0    I have blamed myself unnecessarily when things went wrong. 2    I have been anxious or worried for no good reason. 0    I have felt scared or panicky for no good reason. 0    Things have been getting on top of me. 1    I have been so unhappy that I have had difficulty sleeping. 0    I have felt sad or miserable. 0    I have been so unhappy that I have been crying. 0    The thought of harming myself has occurred to me. 0    Edinburgh Postnatal Depression Scale Total 3           Review of Systems Pertinent items are noted in HPI.    Objective:  Blood pressure 108/70, pulse 89, weight 172 lb (78 kg), unknown if currently breastfeeding.  NAD Abdomen: nttp EGBUS: normal. Well healed perineum, slight irritation at the posterior fourchette/perineum. Very weak pelvic floor muscles on contraction during bimanual exam. Uterus nttp, small Assessment:    Normal postpartum exam.   Plan:   Patient doing well. She is breast feeding only and amenorrheic. She has vaginal dryness which is normal with breastfeeding only due to low  estrogen which can also be contributing to her urinary incontinence.  Pt is amenable to pelvic floor PT. I also recommend regular, bladder emptying, and I also d/w her re: vaginal estrogen. R/b d/w her and she is amenable to this. Urine culture today too. Recommend 3 month course  GDMa1: 2h PP today  RTC 93m for annual   Bokeelia Bing, MD [facility] [employer], [facility]

## 2020-03-14 ENCOUNTER — Encounter: Payer: Self-pay | Admitting: Obstetrics and Gynecology

## 2020-03-14 LAB — GLUCOSE TOLERANCE, 2 HOURS
Glucose, 2 hour: 108 mg/dL (ref 65–139)
Glucose, GTT - Fasting: 84 mg/dL (ref 65–99)

## 2020-03-15 LAB — URINE CULTURE: Organism ID, Bacteria: NO GROWTH

## 2020-04-30 ENCOUNTER — Encounter: Payer: Self-pay | Admitting: Radiology

## 2020-05-17 ENCOUNTER — Other Ambulatory Visit: Payer: Self-pay

## 2020-05-20 ENCOUNTER — Other Ambulatory Visit: Payer: Self-pay | Admitting: Obstetrics and Gynecology

## 2020-05-20 DIAGNOSIS — R32 Unspecified urinary incontinence: Secondary | ICD-10-CM

## 2020-05-25 ENCOUNTER — Ambulatory Visit: Payer: PRIVATE HEALTH INSURANCE

## 2020-06-08 ENCOUNTER — Other Ambulatory Visit: Payer: Self-pay

## 2020-06-08 ENCOUNTER — Ambulatory Visit: Payer: PRIVATE HEALTH INSURANCE | Attending: Obstetrics and Gynecology

## 2020-06-08 DIAGNOSIS — M533 Sacrococcygeal disorders, not elsewhere classified: Secondary | ICD-10-CM | POA: Diagnosis present

## 2020-06-08 DIAGNOSIS — M62838 Other muscle spasm: Secondary | ICD-10-CM | POA: Diagnosis present

## 2020-06-08 DIAGNOSIS — R29810 Facial weakness: Secondary | ICD-10-CM | POA: Diagnosis present

## 2020-06-08 NOTE — Patient Instructions (Signed)
"  Pre-squeeze and sneeze"  Before you cough, sneeze, laugh etc. "pre-squeeze" the pelvic floor (kegel) and hold it until you finish coughing to retrain the muscles to hold the urine in during these activities.     EXhale on EXertion!!!!! (pushing, pulling, lifting, standing, etc.)  Whenever you exert force you need to exhale to allow the pressure to escape out of your vocal cords rather than be pressed down through your pelvic floor. Exhaling also helps engage your deep-core muscles to protect your back and pelvic floor.  Mini-Marches   (sleeping baby at feet) Exhale, drawing the lower tummy (TA) in toward the back bone and hold contraction while you lift one foot ~ 2 inches off the mat, then the other foot before relaxing and resetting. Try to keep your hips from rocking, using your hands to sense whether they are staying even as pictured.      Perform _2x15__ repetitions for _1-2__ sets. Do this _1-2_ times per day.

## 2020-06-08 NOTE — Therapy (Signed)
[facility] MAIN [facility] SERVICES [address] [place], [place], [address] Phone: [phone]   Fax:  [phone]  Physical Therapy Evaluation  The patient has been informed of current processes in place at Outpatient Rehab to protect patients from Covid-19 exposure including social distancing, schedule modifications, and new cleaning procedures. After discussing their particular risk with a therapist based on the patient's personal risk factors, the patient has decided to proceed with in-person therapy.  Patient Details  Name: Claudia Rogers MRN: [id number] Date of Birth: 07-01-1985 No data recorded  Encounter Date: 06/08/2020   PT End of Session - 06/08/20 0831    Visit Number 1    Number of Visits 10    Date for PT Re-Evaluation 08/17/20    Authorization Type Generic    Authorization Time Period 06/08/20 through 08/17/20    Authorization - Visit Number 1    Authorization - Number of Visits 10    PT Start Time 0735    PT Stop Time 0830    PT Time Calculation (min) 55 min    Activity Tolerance Patient tolerated treatment well;No increased pain    Behavior During Therapy WFL for tasks assessed/performed           Past Medical History:  Diagnosis Date  . Anxiety   . GDM (gestational diabetes mellitus), class A1 11/10/2019   03/2020: negative postpartum 2h GTT  . H/O viral meningitis   . History of chicken pox     Past Surgical History:  Procedure Laterality Date  . EYE MUSCLE SURGERY Bilateral 1996  . EYE MUSCLE SURGERY Bilateral 2000  . Odontoma Removal/implant  2003  . WISDOM TOOTH EXTRACTION      There were no vitals filed for this visit.  Pelvic Floor Physical Therapy Evaluation and Assessment  SCREENING  Falls in last 6 mo: no  Patient's communication preference:   Red Flags:  Have you had any night sweats? N/A Unexplained weight loss? N/A Saddle anesthesia? no Unexplained changes in bowel or bladder habits?  no  SUBJECTIVE  Patient reports: Even prior to the most recent pregnancy she would have SUI but when she was pregnant and would throw up she would have full bladder emptying. After most recent delivery she would not feel the urge to urinate and would have complete incontinence when she went to pull down her pants. She now can sense leakage but still is having full leakage when she was throwing up when she had covid. She also has leakage with coughing/sneezing and jumping as well as some leakage without awareness. Wearing period pads 1-2 per day.   Has been having LBP that started after delivery and she has a hard time sleeping because of it, cannot lay on her side because of it. Has been to the chiropractor through pregnancy and a couple times PP. Had PGP during pregnancy.   She got swelling and vericose veins just on the R side during pregnancy. H/o plantar fasciitis on RLE  Precautions:  Anxiety, 4 mo. PP  Social/Family/Vocational History:   Not working   Recent Procedures/Tests/Findings:  none  Obstetrical History: 2 vaginal deliveries, grade 2 tear with most recent  Gynecological History: none  Urinary History: See above  Gastrointestinal History: No issues  Sexual activity/pain: No pain  Location of pain: LBP Current pain:  3/10 (with 3 ibuprofen on board)  Max pain:  7/10 Least pain:  2/10 Nature of pain: Ache  Patient Goals: Not have leakage or pain  with ADL's and exercise.   OBJECTIVE  Posture/Observations:  Sitting: Feet do not reach floor 5" 3" Standing: R ASIS out-flare, R foot ER'ed, FHP, slight anterior pelvic tilt.  Palpation/Segmental Motion/Joint Play: TTP to R>L hip-flexors, B OI, QL, foot intrinsics, calves, and lumbar extensors, L Piriformis. Mobility of spine WLN, painful through lumbar curve.  Special tests:   Scoliosis: negative Supine-to-long-sit: RLE long in both.  Range of Motion/Flexibilty:  Spine: Forward bend ~ 3 in. From floor,  R knee bent. ROT and SB WNL but pulling on R SIJ with L SB Hips:   Strength/MMT: Deferred to follow-up LE MMT  LE MMT Left Right  Hip flex:  (L2) /5 /5  Hip ext: /5 /5  Hip abd: /5 /5  Hip add: /5 /5  Hip IR /5 /5  Hip ER /5 /5     Abdominal:  Palpation: Diastasis: 2 fingers at rest, 1 finger with chin-tuck  Pelvic Floor External Exam: Deferred to follow-up Introitus Appears:  Skin integrity:  Palpation: Cough: Prolapse visible?: Scar mobility:  Internal Vaginal Exam: Strength (PERF):  Symmetry: Palpation: Prolapse:   Internal Rectal Exam: Strength (PERF): Symmetry: Palpation: Prolapse:   Gait Analysis: WNL   Pelvic Floor Outcome Measures: FOTO PFDI Pain: 8, Urinary Problem: 49, PFDI Urinary: 8   INTERVENTIONS THIS SESSION: Self-care: Educated on the structure and function of the pelvic floor in relation to their symptoms as well as the POC, and initial HEP in order to set patient expectations and understanding from which we will build on in the future sessions. Educated on soda-can theory to help decrease SUI.  Therex: educated on and practiced mini-marches, and pre-squeeze and sneeze and discussed precautions for exercise to decrease intra-abdominal pressure and prevent further harm   Total time: 55 min.                    Objective measurements completed on examination: See above findings.                 PT Short Term Goals - 06/08/20 0920      PT SHORT TERM GOAL #1   Title Patient will demonstrate improved pelvic alignment and balance of musculature surrounding the pelvis to facilitate decreased PFM spasms and decrease pelvic pain.    Baseline RLE long/up-slipped    Time 5    Period Weeks    Status New    Target Date 07/13/20      PT SHORT TERM GOAL #2   Title Pt. will be compliant with wearing heel-lift during ~90% of the day while being active to help prevent return of spasms and malalignment.    Baseline Pt.  demonstrates a LLD with RLE long    Time 5    Period Weeks    Status New    Target Date 07/13/20      PT SHORT TERM GOAL #3   Title Patient will demonstrate HEP x1 in the clinic to demonstrate understanding and proper form to allow for further improvement.    Baseline Pt. lacks knowledge of what therapeutic exercises will help decrease her Sx.    Time 5    Period Weeks    Status New    Target Date 07/13/20             PT Long Term Goals - 06/08/20 0923      PT LONG TERM GOAL #1   Title Patient will report no episodes of UI over the course of the  prior two weeks to demonstrate improved functional ability.    Baseline Pt. needing a pad daily for unconscious leakage and has full bladder emptying with vomiting, significant leakage with cough/sneeze/jump.    Time 10    Period Weeks    Status New    Target Date 08/17/20      PT LONG TERM GOAL #2   Title Patient will describe pain no greater than 2/10 during exercise for 1 hour, ADL's to demonstrate improved functional ability.    Baseline Having pain up to 7/10 in LB with ADL's and exercise, not able to run due to plantar fasciitis    Time 10    Period Weeks    Status New    Target Date 08/17/20      PT LONG TERM GOAL #3   Title Pt. will improve in FOTO score by 10 points or maximal score in each category to demonstrate improved function.    Baseline FOTO PFDI Pain: 8, Urinary Problem: 49, PFDI Urinary: 8    Time 10    Period Weeks    Status New    Target Date 08/17/20                  Plan - 06/08/20 6948    Clinical Impression Statement Pt. is a 35 y/o female who presents today with cheif c/o UI and LBP following the delivery of her second child. Her PMH is significant for a grade 2 tear during vaginal delivery and PGP through 2 pregnancies as well as anxiety and h/o R foot plantar fasciitis. Her Clnical exam revealed a LLD with the RLE long and spasms surrounding the R>L, mild anterior pelvic tilt and FHP, and  history suggesting PFM weakness. She will benefit from skilled pelvic floor PT to address the noted deficits and to continue to assess for and address any other causes of Sx.    Personal Factors and Comorbidities Comorbidity 2    Comorbidities Post-partum hypermobility, Anxiety    Examination-Activity Limitations Continence    Examination-Participation Restrictions Other;Community Activity   Exercise   Stability/Clinical Decision Making Stable/Uncomplicated    Clinical Decision Making Low    Rehab Potential Excellent    PT Frequency 1x / week    PT Duration Other (comment)   10 weeks   PT Treatment/Interventions ADLs/Self Care Home Management;Biofeedback;Electrical Stimulation;Gait training;Functional mobility Network engineer;Therapeutic activities;Therapeutic exercise;Neuromuscular re-education;Orthotic Fit/Training;Patient/family education;Manual techniques;Taping;Dry needling;Scar mobilization;Spinal Manipulations;Joint Manipulations    PT Next Visit Plan Perform spasm reduction to R hip-flexors and LB followed by up-slip correction (sacral mobs PRN), give heel-lift for LLE and teach side-stretch and squat form. review HEP    PT Home Exercise Plan mini-marches, pre-squeeze and sneeze, soda-can theory.    Consulted and Agree with Plan of Care Patient           Patient will benefit from skilled therapeutic intervention in order to improve the following deficits and impairments:  Increased muscle spasms,Improper body mechanics,Decreased scar mobility,Decreased coordination,Decreased strength,Postural dysfunction,Pain  Visit Diagnosis: Sacrococcygeal disorders, not elsewhere classified  Other muscle spasm  Facial weakness     Problem List Patient Active Problem List   Diagnosis Date Noted  . Vaginal atrophy 03/13/2020  . Urinary incontinence 03/13/2020  . History of meningitis 05/12/2008   Cleophus Molt DPT, ATC Cleophus Molt 06/08/2020, 9:26 AM  [facility] MAIN [facility] SERVICES [address] [place], [place], [address] Phone: [phone]   Fax:  [phone]  Name: Xayla Puzio  Kissner MRN: [id number] Date of Birth: 06-08-1985

## 2020-06-15 ENCOUNTER — Ambulatory Visit: Payer: PRIVATE HEALTH INSURANCE

## 2020-06-21 ENCOUNTER — Ambulatory Visit: Payer: PRIVATE HEALTH INSURANCE

## 2020-06-22 ENCOUNTER — Ambulatory Visit: Payer: PRIVATE HEALTH INSURANCE | Attending: Obstetrics and Gynecology

## 2020-06-22 ENCOUNTER — Other Ambulatory Visit: Payer: Self-pay

## 2020-06-22 DIAGNOSIS — M62838 Other muscle spasm: Secondary | ICD-10-CM | POA: Insufficient documentation

## 2020-06-22 DIAGNOSIS — R29810 Facial weakness: Secondary | ICD-10-CM | POA: Insufficient documentation

## 2020-06-22 DIAGNOSIS — M533 Sacrococcygeal disorders, not elsewhere classified: Secondary | ICD-10-CM | POA: Insufficient documentation

## 2020-06-22 NOTE — Therapy (Signed)
[facility] MAIN [facility] SERVICES [address] [place], [place], [address] Phone: [phone]   Fax:  [phone]  Physical Therapy Treatment  The patient has been informed of current processes in place at Outpatient Rehab to protect patients from Covid-19 exposure including social distancing, schedule modifications, and new cleaning procedures. After discussing their particular risk with a therapist based on the patient's personal risk factors, the patient has decided to proceed with in-person therapy.   Patient Details  Name: Claudia Rogers MRN: [id number] Date of Birth: [birth year] No data recorded  Encounter Date: 06/22/2020   PT End of Session - 06/22/20 0839    Visit Number 2    Number of Visits 10    Date for PT Re-Evaluation 08/17/20    Authorization Type Generic    Authorization Time Period 06/08/20 through 08/17/20    Authorization - Visit Number 2    Authorization - Number of Visits 10    PT Start Time 0730    PT Stop Time 0830    PT Time Calculation (min) 60 min    Activity Tolerance Patient tolerated treatment well;No increased pain    Behavior During Therapy WFL for tasks assessed/performed           Past Medical History:  Diagnosis Date  . Anxiety   . GDM (gestational diabetes mellitus), class A1 11/10/2019   03/2020: negative postpartum 2h GTT  . H/O viral meningitis   . History of chicken pox     Past Surgical History:  Procedure Laterality Date  . EYE MUSCLE SURGERY Bilateral 1996  . EYE MUSCLE SURGERY Bilateral 2000  . Odontoma Removal/implant  2003  . WISDOM TOOTH EXTRACTION      There were no vitals filed for this visit.    Pelvic Floor Physical Therapy Treatment Note  SCREENING  Changes in medications, allergies, or medical history?: none    SUBJECTIVE  Patient reports: No change except that she could engage/flatten her back when doing exercise and it helped a lot.  Precautions:  Anxiety, 4 mo.  PP  Pain update:   Location of pain: LBP Current pain: 5/10 (with 3 ibuprofen on board)  Max pain: 7/10 Least pain: 2/10 Nature of pain:Ache  **following treatment no pain  Patient Goals: Not have leakage or pain with ADL's and exercise.   OBJECTIVE  Changes in: Posture/Observations:  Mild L anterior rotation   **ASIS and ankles level in supine and PSIS level in standing following treatment.  Range of Motion/Flexibilty:    Strength/MMT:  LE MMT:  Pelvic floor:  Abdominal:   Palpation: TTP to B OI, Piriformis, ~L5 multifidus and L iliopsoas distally.   Gait Analysis:  INTERVENTIONS THIS SESSION: Manual: performed TP release to B OI, Piriformis, ~L5 multifidus and L iliopsoas distally to decrease spasm and pain and allow for improved balance of musculature for improved function and decreased symptoms.  Theract: reviewed mini-marches for correct performance briefly and educated on how to perform self TP release with a tennis ball if needed to keep spasms under control as we build strength. Educated on the deep-core components and their interplay as it relates to her pain.   Total time: 60 min.                              PT Short Term Goals - 06/08/20 0920      PT SHORT TERM GOAL #1   Title Patient  will demonstrate improved pelvic alignment and balance of musculature surrounding the pelvis to facilitate decreased PFM spasms and decrease pelvic pain.    Baseline RLE long/up-slipped    Time 5    Period Weeks    Status New    Target Date 07/13/20      PT SHORT TERM GOAL #2   Title Pt. will be compliant with wearing heel-lift during ~90% of the day while being active to help prevent return of spasms and malalignment.    Baseline Pt. demonstrates a LLD with RLE long    Time 5    Period Weeks    Status New    Target Date 07/13/20      PT SHORT TERM GOAL #3   Title Patient will demonstrate HEP x1 in the clinic to demonstrate  understanding and proper form to allow for further improvement.    Baseline Pt. lacks knowledge of what therapeutic exercises will help decrease her Sx.    Time 5    Period Weeks    Status New    Target Date 07/13/20             PT Long Term Goals - 06/08/20 0923      PT LONG TERM GOAL #1   Title Patient will report no episodes of UI over the course of the prior two weeks to demonstrate improved functional ability.    Baseline Pt. needing a pad daily for unconscious leakage and has full bladder emptying with vomiting, significant leakage with cough/sneeze/jump.    Time 10    Period Weeks    Status New    Target Date 08/17/20      PT LONG TERM GOAL #2   Title Patient will describe pain no greater than 2/10 during exercise for 1 hour, ADL's to demonstrate improved functional ability.    Baseline Having pain up to 7/10 in LB with ADL's and exercise, not able to run due to plantar fasciitis    Time 10    Period Weeks    Status New    Target Date 08/17/20      PT LONG TERM GOAL #3   Title Pt. will improve in FOTO score by 10 points or maximal score in each category to demonstrate improved function.    Baseline FOTO PFDI Pain: 8, Urinary Problem: 49, PFDI Urinary: 8    Time 10    Period Weeks    Status New    Target Date 08/17/20                 Plan - 06/22/20 0839    Clinical Impression Statement Pt. Responded well to all interventions today, demonstrating improved pelvic alignment, resolution of pain and reduced spasms, as well as understanding and correct performance of all education and exercises provided today. They will continue to benefit from skilled physical therapy to work toward remaining goals and maximize function as well as decrease likelihood of symptom increase or recurrence.    PT Next Visit Plan re-assess pelvic alignment, teach side-stretch, child's pose, squat form, planks, boat pose, assess PFM and treat.    PT Home Exercise Plan mini-marches,  pre-squeeze and sneeze, soda-can theory, self TP release with tennis ball.    Consulted and Agree with Plan of Care Patient           Patient will benefit from skilled therapeutic intervention in order to improve the following deficits and impairments:     Visit Diagnosis: Sacrococcygeal disorders, not elsewhere classified  Other muscle spasm     Problem List Patient Active Problem List   Diagnosis Date Noted  . Vaginal atrophy 03/13/2020  . Urinary incontinence 03/13/2020  . History of meningitis 05/12/2008   Cleophus Molt DPT, ATC Cleophus Molt 06/22/2020, 8:42 AM  [facility] MAIN [facility] SERVICES [address] [place], [place], [address] Phone: [phone]   Fax:  [phone]  Name: Claudia Rogers MRN: [id number] Date of Birth: [birth year]

## 2020-06-22 NOTE — Patient Instructions (Signed)
    This is The QL muscle  To perform release on this muscle, start by getting into this position by bridging the hips up and then slowly lowering your back, then your butt down to lengthen the low back then put the ball under you where you feel the tender spot and roll to the same side slightly to add pressure as needed. Hold still and take deep breaths until the pain is at least 50% less or, ideally, just pressure.   This is your piriformis    To release this muscle start in this position with your ankle crossed over the opposite knee. Place the tennis ball under your buttock where the tender spot is and then slightly roll your weight to the same side to put just enough pressure that it is uncomfortable. Hold and take deep breaths until the pain is at least 50% less or, ideally ,just pressure.   This is your Gluteus Medius and Minimus  To perform release on this muscle, start by getting into this position by bridging the hips up and then slowly lowering your back, then your butt down to lengthen the low back then put the ball under you where you feel the tender spot and roll to the same side slightly to add pressure as needed. Hold still and take deep breaths until the pain is at least 50% less or, ideally, just pressure.   These are your deep hip-flexor muscles. They are easiest to reach where they come together at the hip.   To perform release on this muscle, start by getting into this position by laying on your stomach then put the ball under you where you feel the tender spot and bring the opposite knee up/out to the side to add pressure as needed. Hold still and take deep breaths until the pain is at least 50% less or, ideally ,just pressure.   

## 2020-06-29 ENCOUNTER — Ambulatory Visit: Payer: PRIVATE HEALTH INSURANCE

## 2020-06-29 ENCOUNTER — Other Ambulatory Visit: Payer: Self-pay

## 2020-06-29 DIAGNOSIS — M62838 Other muscle spasm: Secondary | ICD-10-CM

## 2020-06-29 DIAGNOSIS — M533 Sacrococcygeal disorders, not elsewhere classified: Secondary | ICD-10-CM | POA: Diagnosis not present

## 2020-06-29 DIAGNOSIS — R29810 Facial weakness: Secondary | ICD-10-CM

## 2020-06-29 NOTE — Patient Instructions (Signed)
Boat Pose  1. 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 Place fingers at/below belly button, in seated position like the photo below, lean back, until just before you notice an increase in separation at your fingers or your back arches.  3. Try to hold for about 30 seconds or until you feel fatigued/ body starts to compensate.  4. Use your hands to work your self back up. Feel stability under your feet.       Start on all fours, tuck your hips under slightly and come forward until you are in a straight line. Make sure that your knees, your hips, and your shoulders are in a straight line. Hold for ~ 30 seconds, (or until you feel like you are too tired to hold it correctly). Repeat 2-3 times (or up to ~ 1:30 sec. Total).   As you get stronger, you can lengthen each hold. When you can hold for ~ 1:30 straight, try switching to on your toes instead of knees. You will need to decrease your hold time initially and build back up to the full 1:30.   Keep your trunk as one unit and let it hinge forward from the hips as you push your bottom back and bend your knees at the same rate that you bend your hips. Keep your weight back toward your heels but do not actually lift the toes off the ground. Exhale starting just before and all the way through standing to help engage the glutes and lower tummy muscles.  Do 2x15

## 2020-06-29 NOTE — Therapy (Signed)
[place] MAIN [facility] SERVICES [address] [place], [place], [address] Phone: [phone]   Fax:  [phone]  Physical Therapy Treatment  The patient has been informed of current processes in place at [facility]patient [facility]hab to protect patients from Covid-19 exposure including social distancing, schedule modifications, and new cleaning procedures. After discussing their particular risk with a therapist based on the patient's personal risk factors, the patient has decided to proceed with in-person therapy.  Patient Details  Name: Claudia Rogers MRN: [id number] Date of Birth: [birth year] No data recorded  Encounter Date: 06/29/2020   PT End of Session - 06/29/20 1215    Visit Number 3    Number of Visits 10    Date for PT Re-Evaluation 08/17/20    Authorization Type Generic    Authorization Time Period 06/08/20 through 08/17/20    Authorization - Visit Number 3    Authorization - Number of Visits 10    Progress Note Due on Visit 10    PT Start Time 0730    PT Stop Time 0830    PT Time Calculation (min) 60 min    Activity Tolerance Patient tolerated treatment well;No increased pain    Behavior During Therapy WFL for tasks assessed/performed           Past Medical History:  Diagnosis Date  . Anxiety   . GDM (gestational diabetes mellitus), class A1 11/10/2019   03/2020: negative postpartum 2h GTT  . H/O viral meningitis   . History of chicken pox     Past Surgical History:  Procedure Laterality Date  . EYE MUSCLE SURGERY Bilateral 1996  . EYE MUSCLE SURGERY Bilateral 2000  . Odontoma Removal/implant  2003  . WISDOM TOOTH EXTRACTION      There were no vitals filed for this visit.    Pelvic Floor Physical Therapy Treatment Note  SCREENING  Changes in medications, allergies, or medical history?: none    SUBJECTIVE  Patient reports: Has only had to take Ibuprofen 2 times this week which she had been needing every night to sleep. R LB  hurts. About the same with leakage.   Precautions:  Anxiety, 4 mo. PP  Pain update:   Location of pain: LBP Current pain: 3/10  Max pain: 5/10 Least pain: 0/10 Nature of pain:Ache  **following treatment no pain  Patient Goals: Not have leakage or pain with ADL's and exercise.   OBJECTIVE  Changes in: Posture/Observations:  RLE long by ~ 3/4cm and rotated forward.   **following treatment PSIS level in standing with heel-lift in LLE. No obliquity remaining.  Range of Motion/Flexibilty:    Strength/MMT:  LE MMT:  Pelvic floor:  Abdominal:   Palpation: TTP to R iliopsoas and L QL.   Gait Analysis:  INTERVENTIONS THIS SESSION: Manual: performed TP release to R iliopsoas distally and L QL followed by MET correction x3 for R anterior rotation to decrease spasm and pain and allow for improved balance of musculature and improved pelvic alignment for improved function and decreased symptoms.  Therex: Educated on and practiced boat-pose low mod. Plank on a chair, and squats with glute emphasis to improve strength of muscles opposing tight musculature to allow reciprocal inhibition to improve balance of musculature surrounding the pelvis and improve overall posture for optimal musculature length-tension relationship and function.  Theract educated on and given heel-lift and instructions for slowly increasing wear time to prevent increased pain with transition and allow for improved pelvic alignment to maintain pelvic  stability and prevent return of spasms and pain.      Total time: 60 min.                             PT Short Term Goals - 06/08/20 0920      PT SHORT TERM GOAL #1   Title Patient will demonstrate improved pelvic alignment and balance of musculature surrounding the pelvis to facilitate decreased PFM spasms and decrease pelvic pain.    Baseline RLE long/up-slipped    Time 5    Period Weeks    Status New    Target Date  07/13/20      PT SHORT TERM GOAL #2   Title Pt. will be compliant with wearing heel-lift during ~90% of the day while being active to help prevent return of spasms and malalignment.    Baseline Pt. demonstrates a LLD with RLE long    Time 5    Period Weeks    Status New    Target Date 07/13/20      PT SHORT TERM GOAL #3   Title Patient will demonstrate HEP x1 in the clinic to demonstrate understanding and proper form to allow for further improvement.    Baseline Pt. lacks knowledge of what therapeutic exercises will help decrease her Sx.    Time 5    Period Weeks    Status New    Target Date 07/13/20             PT Long Term Goals - 06/08/20 0923      PT LONG TERM GOAL #1   Title Patient will report no episodes of UI over the course of the prior two weeks to demonstrate improved functional ability.    Baseline Pt. needing a pad daily for unconscious leakage and has full bladder emptying with vomiting, significant leakage with cough/sneeze/jump.    Time 10    Period Weeks    Status New    Target Date 08/17/20      PT LONG TERM GOAL #2   Title Patient will describe pain no greater than 2/10 during exercise for 1 hour, ADL's to demonstrate improved functional ability.    Baseline Having pain up to 7/10 in LB with ADL's and exercise, not able to run due to plantar fasciitis    Time 10    Period Weeks    Status New    Target Date 08/17/20      PT LONG TERM GOAL #3   Title Pt. will improve in FOTO score by 10 points or maximal score in each category to demonstrate improved function.    Baseline FOTO PFDI Pain: 8, Urinary Problem: 49, PFDI Urinary: 8    Time 10    Period Weeks    Status New    Target Date 08/17/20                 Plan - 06/29/20 1216    Clinical Impression Statement Pt. Responded well to all interventions today, demonstrating improved pelvic alignment and decreased spasm and pain, as well as understanding and correct performance of all education  and exercises provided today. They will continue to benefit from skilled physical therapy to work toward remaining goals and maximize function as well as decrease likelihood of symptom increase or recurrence.    PT Next Visit Plan Assess running mechanics, teach side-stretch, child's pose, assess PFM and treat.    PT Home Exercise Plan  mini-marches, pre-squeeze and sneeze, soda-can theory, self TP release with tennis ball, heel-lift for LLE, boat-pose, planks, squat form.    Consulted and Agree with Plan of Care Patient           Patient will benefit from skilled therapeutic intervention in order to improve the following deficits and impairments:     Visit Diagnosis: Sacrococcygeal disorders, not elsewhere classified  Other muscle spasm  Facial weakness     Problem List Patient Active Problem List   Diagnosis Date Noted  . Vaginal atrophy 03/13/2020  . Urinary incontinence 03/13/2020  . History of meningitis 05/12/2008   Willa Rough DPT, ATC Willa Rough 06/29/2020, 12:24 PM  [place] MAIN [facility] SERVICES [address] [place], [place], [address] Phone: [phone]   Fax:  [phone]  Name: Claudia Rogers MRN: [id number] Date of Birth: 23-Mar-1986

## 2020-07-13 ENCOUNTER — Ambulatory Visit: Payer: PRIVATE HEALTH INSURANCE | Attending: Family Medicine

## 2020-07-13 ENCOUNTER — Other Ambulatory Visit: Payer: Self-pay

## 2020-07-13 DIAGNOSIS — M62838 Other muscle spasm: Secondary | ICD-10-CM | POA: Insufficient documentation

## 2020-07-13 DIAGNOSIS — M533 Sacrococcygeal disorders, not elsewhere classified: Secondary | ICD-10-CM | POA: Insufficient documentation

## 2020-07-13 NOTE — Patient Instructions (Addendum)
   Hold for 30 seconds (5 deep breaths) and repeat 2-3 times on each side once a day  Child's Pose Pelvic Floor Lengthening    Sit in knee-chest position and reach arms forward. Separate knees for comfort. Hold position for _5__ breaths. Repeat _2-3__ times. Do _1-2__ times per day.    Start by leaning forward as a single unit then "catching" yourself to find the angle of forward lean that is appropriate. Minimize "bobbing" up and down and take faster, slightly shorter steps as if you are just repeatedly catching yourself as you lean forward rather than trying to "push" yourself forward.   Pelvic Tilt With Pelvic Floor (Hook-Lying)        Lie with hips and knees bent. Squeeze pelvic floor and flatten low back while breathing out   Put a pillow under your hips in this position when doing this exercise to help decrease pressure in the pelvis when it feels "heavy" or you feel the urge to urinte.   Self Posterior Fourchette Stretching/Mobilization    1) Wash your hands and prop your body up so you can easily reach the vagina, bring hand-held mirror if desired.  2) Apply lubricant to the thumb and vaginal opening  3) Place thumb ~ 1/2 an inch into the vagina with the pad of the thumb pointed down and apply gentle pressure to the posterior fourchette.  4) Gently maintain pressure down toward the anus. Make sure the pressure is not so great that your muscles tighten up and guard, just enough to create slight discomfort.  Do this for ~ 3 min. Per night to decrease tightness and tenderness at the vaginal opening.  Self External Trigger Point Relief    1) Wash your hands and prop yourself up in a way where you can easily reach the vagina. You may wish to have a small hand-held mirror near by.  2) Use the 2 middle fingers to put gentle pressure on the three external pelvic floor muscles and hold pressure and take deep breaths as you allow the tension to release and discomfort to  dissipate   3) Repeat the process for any trigger points you find spending between 3-10 minutes on this every 1-2 days until you do not find any more trigger points or you are told otherwise by your therapist.

## 2020-07-13 NOTE — Therapy (Addendum)
[place] [facility] MAIN [facility] SERVICES [address] [place], [place], [address] Phone: [phone]   Fax:  [phone]  Physical Therapy Treatment  The patient has been informed of current processes in place at Outpatient Rehab to protect patients from Covid-19 exposure including social distancing, schedule modifications, and new cleaning procedures. After discussing their particular risk with a therapist based on the patient's personal risk factors, the patient has decided to proceed with in-person therapy.  Patient Details  Name: Claudia Rogers MRN: [id number] Date of Birth: 1986-01-08 No data recorded  Encounter Date: 07/13/2020   PT End of Session - 07/17/20 1111    Visit Number 4    Number of Visits 10    Date for PT Re-Evaluation 08/17/20    Authorization Type Generic    Authorization Time Period 06/08/20 through 08/17/20    Authorization - Visit Number 4    Authorization - Number of Visits 10    Progress Note Due on Visit 10    PT Start Time 0930    PT Stop Time 1030    PT Time Calculation (min) 60 min    Activity Tolerance Patient tolerated treatment well;No increased pain    Behavior During Therapy WFL for tasks assessed/performed           Past Medical History:  Diagnosis Date  . Anxiety   . GDM (gestational diabetes mellitus), class A1 11/10/2019   03/2020: negative postpartum 2h GTT  . H/O viral meningitis   . History of chicken pox     Past Surgical History:  Procedure Laterality Date  . EYE MUSCLE SURGERY Bilateral 1996  . EYE MUSCLE SURGERY Bilateral 2000  . Odontoma Removal/implant  2003  . WISDOM TOOTH EXTRACTION      There were no vitals filed for this visit.  Pelvic Floor Physical Therapy Treatment Note  SCREENING  Changes in medications, allergies, or medical history?: none    SUBJECTIVE  Patient reports: She had a sinus infection and food poisoning last week which led to a lot of SUI. Feels like it "re-set"  something and she feels like it re-set everything. Had been wearing her heel lift consistently but she took it out during a run at the park and thinks she probably lost it. She has been doing her exercises regularly.  Precautions:  Anxiety, 4 mo. PP  Pain update:   Location of pain: LBP Current pain: 0/10  Max pain: 0/10 Least pain: 0/10 Nature of pain:Ache  **following treatment no pain  Patient Goals: Not have leakage or pain with ADL's and exercise.   OBJECTIVE  Changes in: Posture/Observations:  06/29/20: RLE long by ~ 3/4cm and rotated forward.  **following treatment PSIS level in standing with heel-lift in LLE. No obliquity remaining.  TODAY: pelvic alignment not re-assessed due to Pt. Not having heel-lift with her today.  Range of Motion/Flexibilty:    Strength/MMT:  LE MMT:   Pelvic Floor External Exam: Introitus Appears: WNL Skin integrity: WNL Palpation: TTP to STP Cough: parodoxical Prolapse visible?: no Scar mobility: decreased  Internal Vaginal Exam: Strength (PERF): ~ 3/5, 5 sec, 2 times Symmetry: similar Palpation: TTP throughout the PFM  Prolapse: anterior and posterior walls visible at ~1/2 cm above the introitus with bearing down.  Abdominal:   Palpation: TTP to R iliopsoas and L QL.   Gait Analysis: Pt. Swings her arms in a rotational/lateral motion and pushes up through DF causing "bobbing" with jogging. RLE slightly ER'ed.  INTERVENTIONS THIS  SESSION: Manual: Assessed PFM and performed TP release to scar at 5 o'clock and R posterior PC to decrease spasm and pain and allow for improved balance of musculature and improved pelvic alignment for improved function and decreased symptoms.  Self-care: Educated on how to perform self external PFM and posterior fourchette release, pre-squeeze-and-sneeze, and soda-can theory to minimize impact of POP and decrease SUI.  Therex: Performed running assessment/gait training to minimize impact on  the PFM and prevent worsening of POP, educated on side-stretch and LB stretch To maintain and improve muscle length and allow for improved balance of musculature for long-term symptom relief. Educated on and practiced posterior pelvic tilts to correct for POP and "re-set" to allow for Sx. Management until PFM demonstrate improved strength/recruitment.     Total time: 60 min.                               PT Short Term Goals - 06/08/20 0920      PT SHORT TERM GOAL #1   Title Patient will demonstrate improved pelvic alignment and balance of musculature surrounding the pelvis to facilitate decreased PFM spasms and decrease pelvic pain.    Baseline RLE long/up-slipped    Time 5    Period Weeks    Status New    Target Date 07/13/20      PT SHORT TERM GOAL #2   Title Pt. will be compliant with wearing heel-lift during ~90% of the day while being active to help prevent return of spasms and malalignment.    Baseline Pt. demonstrates a LLD with RLE long    Time 5    Period Weeks    Status New    Target Date 07/13/20      PT SHORT TERM GOAL #3   Title Patient will demonstrate HEP x1 in the clinic to demonstrate understanding and proper form to allow for further improvement.    Baseline Pt. lacks knowledge of what therapeutic exercises will help decrease her Sx.    Time 5    Period Weeks    Status New    Target Date 07/13/20             PT Long Term Goals - 06/08/20 0923      PT LONG TERM GOAL #1   Title Patient will report no episodes of UI over the course of the prior two weeks to demonstrate improved functional ability.    Baseline Pt. needing a pad daily for unconscious leakage and has full bladder emptying with vomiting, significant leakage with cough/sneeze/jump.    Time 10    Period Weeks    Status New    Target Date 08/17/20      PT LONG TERM GOAL #2   Title Patient will describe pain no greater than 2/10 during exercise for 1 hour, ADL's to  demonstrate improved functional ability.    Baseline Having pain up to 7/10 in LB with ADL's and exercise, not able to run due to plantar fasciitis    Time 10    Period Weeks    Status New    Target Date 08/17/20      PT LONG TERM GOAL #3   Title Pt. will improve in FOTO score by 10 points or maximal score in each category to demonstrate improved function.    Baseline FOTO PFDI Pain: 8, Urinary Problem: 49, PFDI Urinary: 8    Time 10  Period Weeks    Status New    Target Date 08/17/20                 Plan - 07/17/20 1112    Clinical Impression Statement Pt. Responded well to all interventions today, demonstrating improved running mechanics, decreased spasm and TTP, improved PFM endurance (by 5 sec), and ability to use pelvic tilts to decrease POP Sx. Of "urge to urinate" as well as understanding and correct performance of all education and exercises provided today. They will continue to benefit from skilled physical therapy to work toward remaining goals and maximize function as well as decrease likelihood of symptom increase or recurrence.    PT Next Visit Plan re-assess PFM and continue PFM release re-assess pelvic alignment, teach child's pose,progress mini-marches PRN.    PT Home Exercise Plan mini-marches, boat-pose, plank, squats, pre-squeeze and sneeze, soda-can theory, self TP release with tennis ball, side-stretch, LB stretch, posterior pelvic tilts for POP, running mechanics, self posterior fourchette and external PFM release.    Consulted and Agree with Plan of Care Patient           Patient will benefit from skilled therapeutic intervention in order to improve the following deficits and impairments:     Visit Diagnosis: Sacrococcygeal disorders, not elsewhere classified  Other muscle spasm     Problem List Patient Active Problem List   Diagnosis Date Noted  . Vaginal atrophy 03/13/2020  . Urinary incontinence 03/13/2020  . History of meningitis  05/12/2008   Cleophus Molt DPT, ATC Cleophus Molt 07/17/2020, 11:25 AM  [place] [facility] MAIN [facility] SERVICES [address] [place], [place], [address] Phone: [phone]   Fax:  [phone]  Name: Claudia Rogers MRN: [id number] Date of Birth: [birth year]

## 2020-07-20 ENCOUNTER — Ambulatory Visit: Payer: PRIVATE HEALTH INSURANCE

## 2020-08-03 ENCOUNTER — Other Ambulatory Visit: Payer: Self-pay

## 2020-08-03 ENCOUNTER — Ambulatory Visit: Payer: PRIVATE HEALTH INSURANCE

## 2020-08-03 DIAGNOSIS — M62838 Other muscle spasm: Secondary | ICD-10-CM

## 2020-08-03 DIAGNOSIS — M533 Sacrococcygeal disorders, not elsewhere classified: Secondary | ICD-10-CM

## 2020-08-03 NOTE — Therapy (Signed)
[facility] MAIN [facility] SERVICES [address] [place], [place], [address] Phone: [phone]   Fax:  [phone]  Physical Therapy Treatment  The patient has been informed of current processes in place at Outpatient Rehab to protect patients from Covid-19 exposure including social distancing, schedule modifications, and new cleaning procedures. After discussing their particular risk with a therapist based on the patient's personal risk factors, the patient has decided to proceed with in-person therapy.  Patient Details  Name: Claudia Rogers MRN: [id number] Date of Birth: 1986/03/20 No data recorded  Encounter Date: 08/03/2020   PT End of Session - 08/03/20 0818    Visit Number 5    Number of Visits 10    Date for PT Re-Evaluation 08/17/20    Authorization Type Generic    Authorization Time Period 06/08/20 through 08/17/20    Authorization - Visit Number 5    Authorization - Number of Visits 10    Progress Note Due on Visit 10    PT Start Time 0745    PT Stop Time 0830    PT Time Calculation (min) 45 min    Activity Tolerance Patient tolerated treatment well;No increased pain    Behavior During Therapy WFL for tasks assessed/performed           Past Medical History:  Diagnosis Date  . Anxiety   . GDM (gestational diabetes mellitus), class A1 11/10/2019   03/2020: negative postpartum 2h GTT  . H/O viral meningitis   . History of chicken pox     Past Surgical History:  Procedure Laterality Date  . EYE MUSCLE SURGERY Bilateral 1996  . EYE MUSCLE SURGERY Bilateral 2000  . Odontoma Removal/implant  2003  . WISDOM TOOTH EXTRACTION      There were no vitals filed for this visit.  Pelvic Floor Physical Therapy Treatment Note  SCREENING  Changes in medications, allergies, or medical history?: none    SUBJECTIVE  Patient reports: She found her heel lift and has been wearing it for ~ 1 week. She has been having a lot of leaking again  but she has not been running a lot, has been walking and jogging a little. She coughed yesterday and then some came out upon standing. Has been having a sharp pain in the R hip since running with the stroller yesterday. Has continued to have LBP ~ 4/10 since going to the chiropractor and finding her heel-lift ~ 1 week ago.  Precautions:  Anxiety, 4 mo. PP  Pain update:   Location of pain: LBP (R hip) Current pain: 4/10 (2/10) Max pain: 7/10 Least pain: 0/10 Nature of pain:Ache  **following treatment no "pain" ~ 2/10 soreness from TPDN.  Patient Goals: Not have leakage or pain with ADL's and exercise.   OBJECTIVE  Changes in: Posture/Observations:  06/29/20: RLE long by ~ 3/4cm and rotated forward.  **following treatment PSIS level in standing with heel-lift in LLE. No obliquity remaining.  07/13/20: pelvic alignment not re-assessed due to Pt. Not having heel-lift with her today.  TODAY: ASIS and PSIS level in standing, ASIS level in supine with RLE long.   Range of Motion/Flexibilty:    Strength/MMT:  LE MMT:   Pelvic Floor External Exam: Introitus Appears: WNL Skin integrity: WNL Palpation: TTP to STP Cough: parodoxical Prolapse visible?: no Scar mobility: decreased  Internal Vaginal Exam: Strength (PERF): ~ 3/5, 5 sec, 2 times Symmetry: similar Palpation: TTP throughout the PFM  Prolapse: anterior and posterior walls visible at ~  1/2 cm above the introitus with bearing down.  Abdominal:   Palpation: TTP to R iliacus and B lumbar multifidus and erector spinae ~ L4-5  Gait Analysis: 07/13/20: Pt. Swings her arms in a rotational/lateral motion and pushes up through DF causing "bobbing" with jogging. RLE slightly ER'ed.  INTERVENTIONS THIS SESSION: Manual: Performed TP release to R iliacus and B lumbar multifidus and erector spinae ~ L4-5 to decrease spasm and pain and allow for improved balance of musculature for improved function and decreased  symptoms.  Self-care: Educated on how to minimize soreness and using a  Belly-band to help support the spine when baby-wearing.  Dry-needle: Performed TPDN with a .30x23mm needle and standard approach as described below to decrease spasm and pain and allow for improved balance of musculature for improved function and decreased symptoms.    Total time: 60 min.                         Trigger Point Dry Needling - 08/03/20 0001    Consent Given? Yes    Education Handout Provided No    Muscles Treated Back/Hip Lumbar multifidi;Erector spinae    Erector spinae Response Twitch response elicited;Palpable increased muscle length    Lumbar multifidi Response Twitch response elicited;Palpable increased muscle length                  PT Short Term Goals - 06/08/20 0920      PT SHORT TERM GOAL #1   Title Patient will demonstrate improved pelvic alignment and balance of musculature surrounding the pelvis to facilitate decreased PFM spasms and decrease pelvic pain.    Baseline RLE long/up-slipped    Time 5    Period Weeks    Status New    Target Date 07/13/20      PT SHORT TERM GOAL #2   Title Pt. will be compliant with wearing heel-lift during ~90% of the day while being active to help prevent return of spasms and malalignment.    Baseline Pt. demonstrates a LLD with RLE long    Time 5    Period Weeks    Status New    Target Date 07/13/20      PT SHORT TERM GOAL #3   Title Patient will demonstrate HEP x1 in the clinic to demonstrate understanding and proper form to allow for further improvement.    Baseline Pt. lacks knowledge of what therapeutic exercises will help decrease her Sx.    Time 5    Period Weeks    Status New    Target Date 07/13/20             PT Long Term Goals - 06/08/20 0923      PT LONG TERM GOAL #1   Title Patient will report no episodes of UI over the course of the prior two weeks to demonstrate improved functional ability.     Baseline Pt. needing a pad daily for unconscious leakage and has full bladder emptying with vomiting, significant leakage with cough/sneeze/jump.    Time 10    Period Weeks    Status New    Target Date 08/17/20      PT LONG TERM GOAL #2   Title Patient will describe pain no greater than 2/10 during exercise for 1 hour, ADL's to demonstrate improved functional ability.    Baseline Having pain up to 7/10 in LB with ADL's and exercise, not able to run due to plantar fasciitis  Time 10    Period Weeks    Status New    Target Date 08/17/20      PT LONG TERM GOAL #3   Title Pt. will improve in FOTO score by 10 points or maximal score in each category to demonstrate improved function.    Baseline FOTO PFDI Pain: 8, Urinary Problem: 49, PFDI Urinary: 8    Time 10    Period Weeks    Status New    Target Date 08/17/20                 Plan - 08/03/20 0818    Clinical Impression Statement Pt. Responded well to all interventions today, demonstrating decreased spasm and resolution of pain as well as understanding and correct performance of all education and exercises provided today. They will continue to benefit from skilled physical therapy to work toward remaining goals and maximize function as well as decrease likelihood of symptom increase or recurrence.    PT Next Visit Plan re-assess PFM and continue PFM release re-assess pelvic alignment, teach child's pose,progress mini-marches PRN.    PT Home Exercise Plan mini-marches, boat-pose, plank, squats, pre-squeeze and sneeze, soda-can theory, self TP release with tennis ball, side-stretch, LB stretch, posterior pelvic tilts for POP, running mechanics, self posterior fourchette and external PFM release.    Consulted and Agree with Plan of Care Patient           Patient will benefit from skilled therapeutic intervention in order to improve the following deficits and impairments:     Visit Diagnosis: Sacrococcygeal disorders, not  elsewhere classified  Other muscle spasm     Problem List Patient Active Problem List   Diagnosis Date Noted  . Vaginal atrophy 03/13/2020  . Urinary incontinence 03/13/2020  . History of meningitis 05/12/2008   Cleophus Molt DPT, ATC Cleophus Molt 08/03/2020, 8:35 AM  [facility] MAIN [facility] SERVICES [address] [place], [place], [address] Phone: [phone]   Fax:  [phone]  Name: Claudia Rogers MRN: [id number] Date of Birth: April 16, 1986

## 2020-08-13 ENCOUNTER — Telehealth: Payer: Self-pay | Admitting: Family Medicine

## 2020-08-13 NOTE — Telephone Encounter (Signed)
That is fine 

## 2020-08-13 NOTE — Telephone Encounter (Signed)
Patient hasn't been here in years and would like to re establish care with you. Would you be willing to do so? EM

## 2020-08-14 ENCOUNTER — Other Ambulatory Visit: Payer: Self-pay

## 2020-08-14 ENCOUNTER — Ambulatory Visit
Admission: EM | Admit: 2020-08-14 | Discharge: 2020-08-14 | Disposition: A | Discharge status: Home / Self Care | Payer: PRIVATE HEALTH INSURANCE | Attending: Family Medicine | Admitting: Family Medicine

## 2020-08-14 ENCOUNTER — Encounter: Payer: Self-pay | Admitting: Emergency Medicine

## 2020-08-14 DIAGNOSIS — H6992 Unspecified Eustachian tube disorder, left ear: Secondary | ICD-10-CM

## 2020-08-14 DIAGNOSIS — H6982 Other specified disorders of Eustachian tube, left ear: Secondary | ICD-10-CM | POA: Diagnosis not present

## 2020-08-14 MED ORDER — PREDNISONE 10 MG PO TABS: 40.0000 mg | ORAL_TABLET | Freq: Every day | ORAL | 0 refills | Status: AC

## 2020-08-14 NOTE — Discharge Instructions (Addendum)
Take the prednisone as prescribed. Take daily with food. Safe during breastfeeding  but recommends breastfeeding at least 4 hours after use.  Continue the allergy medicines  See ENT if this continues.

## 2020-08-14 NOTE — ED Triage Notes (Signed)
Patient c/o LFT ear pain x 8 days.   Patient endorses seeing another provider and being diagnosed with an "ear infection" and was given an antibiotic. Patient has completed an antibiotic.   Patient endorses "my ear still feels clogged, like I have pressure and clear to yellowish drainage from nose".   Patient endorses hearing difficulty and ringing in ear.   Patient endorses " I feel like I'm under water".

## 2020-08-14 NOTE — ED Provider Notes (Signed)
Renaldo Fiddler    CSN: [id number] Arrival date & time: 08/14/20  0907      History   Chief Complaint Chief Complaint  Patient presents with  . Otalgia    HPI Claudia Rogers is a 35 y.o. female.   Patient is a 36 year old female who presents today with left ear pain for approximately 8 days.  She has had decreased hearing, ringing in the ear and feels like there is water in her ear.  Was seen previously and treated for ear infection.  This did not seem to help most of her symptoms.  She has also had some sinus pressure, nasal congestion, rhinorrhea.  No fevers, chills.  Takes allergy medication to include Flonase and antihistamine daily   Otalgia   Past Medical History:  Diagnosis Date  . Anxiety   . GDM (gestational diabetes mellitus), class A1 11/10/2019   03/2020: negative postpartum 2h GTT  . H/O viral meningitis   . History of chicken pox     Patient Active Problem List   Diagnosis Date Noted  . Vaginal atrophy 03/13/2020  . Urinary incontinence 03/13/2020  . History of meningitis 05/12/2008    Past Surgical History:  Procedure Laterality Date  . EYE MUSCLE SURGERY Bilateral 1996  . EYE MUSCLE SURGERY Bilateral 2000  . Odontoma Removal/implant  2003  . WISDOM TOOTH EXTRACTION      OB History    Gravida  4   Para  2   Term  2   Preterm      AB  2   Living  2     SAB  2   IAB      Ectopic      Multiple  0   Live Births  2            Home Medications    Prior to Admission medications   Medication Sig Start Date End Date Taking? Authorizing Provider  escitalopram (LEXAPRO) 20 MG tablet Take 1 tablet (20 mg total) by mouth daily. 10/11/19  Yes Reva Bores, MD  predniSONE (DELTASONE) 10 MG tablet Take 4 tablets (40 mg total) by mouth daily for 5 days. 08/14/20 08/19/20 Yes Alvina Strother A, NP  Prenatal Vit-Fe Fumarate-FA (PRENATAL VITAMIN) 27-0.8 MG TABS Take 1 tablet by mouth daily.    Yes [provider]  ibuprofen  (ADVIL) 600 MG tablet Take 1 tablet (600 mg total) by mouth every 6 (six) hours as needed. 02/02/20   Arabella Merles, CNM    Family History Family History  Problem Relation Age of Onset  . Arthritis Mother   . High blood pressure Mother   . Diabetes Mother        Type II  . Lung cancer Father   . Arthritis Maternal Grandmother   . Stroke Maternal Grandmother   . High blood pressure Maternal Grandmother   . Diabetes Maternal Grandmother        Type II  . Alcoholism Maternal Aunt   . Alcoholism Maternal Uncle   . Alcoholism Cousin   . Lung cancer Paternal Aunt   . High blood pressure Sister   . Kidney disease Maternal Aunt   . Mental illness Sister   . Diabetes Brother        Type II-BKA  . Heart disease Neg Hx   . Cancer Neg Hx     Social History Social History   Tobacco Use  . Smoking status: Never Smoker  . Smokeless  tobacco: Never Used  Vaping Use  . Vaping Use: Never used  Substance Use Topics  . Alcohol use: Not Currently    Alcohol/week: 0.0 standard drinks    Comment: Social  . Drug use: No     Allergies   Patient has no known allergies.   Review of Systems Review of Systems  HENT: Positive for ear pain.      Physical Exam Triage Vital Signs ED Triage Vitals  Enc Vitals Group     BP 08/14/20 0919 122/85     Pulse Rate 08/14/20 0919 74     Resp 08/14/20 0919 18     Temp 08/14/20 0919 98.4 F (36.9 C)     Temp Source 08/14/20 0919 Oral     SpO2 08/14/20 0919 97 %     Weight --      Height --      Head Circumference --      Peak Flow --      Pain Score 08/14/20 0921 3     Pain Loc --      Pain Edu? --      Excl. in GC? --    No data found.  Updated Vital Signs BP 122/85 (BP Location: Left Arm)   Pulse 74   Temp 98.4 F (36.9 C) (Oral)   Resp 18   LMP  (LMP Unknown)   SpO2 97%   Visual Acuity Right Eye Distance:   Left Eye Distance:   Bilateral Distance:    Right Eye Near:   Left Eye Near:    Bilateral Near:      Physical Exam Vitals and nursing note reviewed.  Constitutional:      General: She is not in acute distress.    Appearance: Normal appearance. She is not ill-appearing, toxic-appearing or diaphoretic.  HENT:     Head: Normocephalic.     Right Ear: Tympanic membrane is injected and retracted.     Left Ear: Tympanic membrane is injected and retracted.     Nose: Nose normal.     Mouth/Throat:     Pharynx: Oropharynx is clear.     Comments: PND Eyes:     Conjunctiva/sclera: Conjunctivae normal.  Pulmonary:     Effort: Pulmonary effort is normal.  Musculoskeletal:        General: Normal range of motion.     Cervical back: Normal range of motion.  Skin:    General: Skin is warm and dry.     Findings: No rash.  Neurological:     Mental Status: She is alert.  Psychiatric:        Mood and Affect: Mood normal.      UC Treatments / Results  Labs (all labs ordered are listed, but only abnormal results are displayed) Labs Reviewed - No data to display  EKG   Radiology No results found.  Procedures Procedures (including critical care time)  Medications Ordered in UC Medications - No data to display  Initial Impression / Assessment and Plan / UC Course  I have reviewed the triage vital signs and the nursing notes.  Pertinent labs & imaging results that were available during my care of the patient were reviewed by me and considered in my medical decision making (see chart for details).     ETD- most likely the cause of her symptoms.  Treating with prednisone She can continue the allergy meds.  She is breast feeding but precautions given on breastfeeding and time frame after taking  the prednisone to wait atleast 4 hours. She understands.  Recommend see ENT if this continues.   Final Clinical Impressions(s) / UC Diagnoses   Final diagnoses:  ETD (Eustachian tube dysfunction), left     Discharge Instructions     Take the prednisone as prescribed. Take daily with  food. Safe during breastfeeding  but recommends breastfeeding at least 4 hours after use.  Continue the allergy medicines  See ENT if this continues.     ED Prescriptions    Medication Sig Dispense Auth. Provider   predniSONE (DELTASONE) 10 MG tablet Take 4 tablets (40 mg total) by mouth daily for 5 days. 20 tablet Dahlia Byes A, NP     PDMP not reviewed this encounter.   Janace Aris, NP 08/14/20 1152

## 2020-08-17 ENCOUNTER — Ambulatory Visit: Payer: PRIVATE HEALTH INSURANCE | Attending: Family Medicine

## 2020-08-17 ENCOUNTER — Other Ambulatory Visit: Payer: Self-pay

## 2020-08-17 DIAGNOSIS — M62838 Other muscle spasm: Secondary | ICD-10-CM | POA: Diagnosis present

## 2020-08-17 DIAGNOSIS — R29810 Facial weakness: Secondary | ICD-10-CM | POA: Diagnosis present

## 2020-08-17 DIAGNOSIS — M533 Sacrococcygeal disorders, not elsewhere classified: Secondary | ICD-10-CM | POA: Insufficient documentation

## 2020-08-17 NOTE — Therapy (Signed)
[place] [facility] MAIN [facility] SERVICES [address] [place], [place], [address] Phone: [phone]   Fax:  [phone]  Physical Therapy Treatment  The patient has been informed of current processes in place at Outpatient Rehab to protect patients from Covid-19 exposure including social distancing, schedule modifications, and new cleaning procedures. After discussing their particular risk with a therapist based on the patient's personal risk factors, the patient has decided to proceed with in-person therapy.  Patient Details  Name: Claudia Rogers MRN: [id number] Date of Birth: 1986-01-20 No data recorded  Encounter Date: 08/17/2020   PT End of Session - 08/17/20 0834    Visit Number 6    Number of Visits 10    Date for PT Re-Evaluation 08/17/20    Authorization Type Generic    Authorization Time Period 06/08/20 through 08/17/20    Authorization - Visit Number 6    Authorization - Number of Visits 10    Progress Note Due on Visit 10    PT Start Time 0736    PT Stop Time 0836    PT Time Calculation (min) 60 min    Activity Tolerance Patient tolerated treatment well;No increased pain    Behavior During Therapy WFL for tasks assessed/performed           Past Medical History:  Diagnosis Date  . Anxiety   . GDM (gestational diabetes mellitus), class A1 11/10/2019   03/2020: negative postpartum 2h GTT  . H/O viral meningitis   . History of chicken pox     Past Surgical History:  Procedure Laterality Date  . EYE MUSCLE SURGERY Bilateral 1996  . EYE MUSCLE SURGERY Bilateral 2000  . Odontoma Removal/implant  2003  . WISDOM TOOTH EXTRACTION      There were no vitals filed for this visit.  Pelvic Floor Physical Therapy Treatment Note  SCREENING  Changes in medications, allergies, or medical history?: none    SUBJECTIVE  Patient reports: She has hearing loss following a double ear infection. Her back feels a lot better but she has also had to  be on Advil a lot because of the inflammation in her ears. She has been running a lot. She was coughing a lot with the ear infection and had a lot less leakage (drops). Her highest pain is in the middle of the night/when she is trying to go to sleep. Pain is worst when she lays on her R side.  Precautions:  Anxiety, 4 mo. PP  Pain update:   Location of pain: LBP (R hip) Current pain: 0/10 (2/10) Max pain: 4/10 Least pain: 0/10 Nature of pain:Ache  **following treatment no "pain" ~ 2/10 soreness from TPDN.  Patient Goals: Not have leakage or pain with ADL's and exercise.   OBJECTIVE  Changes in: Posture/Observations:  06/29/20: RLE long by ~ 3/4cm and rotated forward.  **following treatment PSIS level in standing with heel-lift in LLE. No obliquity remaining.  07/13/20: pelvic alignment not re-assessed due to Pt. Not having heel-lift with her today.  TODAY: ASIS and PSIS level in standing, ASIS level in supine with RLE long. (heel-lift in L shoe)  Range of Motion/Flexibilty:  Decreased fascial mobility through R>L lateral abdomen.  Strength/MMT:  LE MMT:   Pelvic Floor External Exam: Introitus Appears: WNL Skin integrity: WNL Palpation: TTP to STP Cough: parodoxical Prolapse visible?: no Scar mobility: decreased  Internal Vaginal Exam: Strength (PERF): ~ 3/5, 5 sec, 2 times Symmetry: similar Palpation: TTP throughout the PFM  Prolapse: anterior and posterior walls visible at ~1/2 cm above the introitus with bearing down.  Abdominal:   Palpation: TTP to B QL and L multifidus near L1  Gait Analysis: 07/13/20: Pt. Swings her arms in a rotational/lateral motion and pushes up through DF causing "bobbing" with jogging. RLE slightly ER'ed.  INTERVENTIONS THIS SESSION: Manual: Performed TP release and STM to B QL and L multifidus near L1 as well as MFR to B lateral abdomen to decrease spasm and pain and allow for improved balance and recruitment of musculature for  improved function and decreased symptoms.  Dry-needle: Performed TPDN with a .30x49mm needle and standard approach as described below to decrease spasm and pain and allow for improved balance of musculature for improved function and decreased symptoms.  Therex: reviewed and expanded on mini-marches to include dead-bug with legs only and performed cat-cow 2x10 with MFR to improve recruitment of TA for DR approximation.    Total time: 60 min.                      Trigger Point Dry Needling - 08/17/20 0001    Consent Given? Yes    Education Handout Provided No    Muscles Treated Back/Hip Lumbar multifidi;Quadratus lumborum    Lumbar multifidi Response Twitch response elicited;Palpable increased muscle length    Quadratus Lumborum Response Twitch response elicited;Palpable increased muscle length                  PT Short Term Goals - 06/08/20 0920      PT SHORT TERM GOAL #1   Title Patient will demonstrate improved pelvic alignment and balance of musculature surrounding the pelvis to facilitate decreased PFM spasms and decrease pelvic pain.    Baseline RLE long/up-slipped    Time 5    Period Weeks    Status New    Target Date 07/13/20      PT SHORT TERM GOAL #2   Title Pt. will be compliant with wearing heel-lift during ~90% of the day while being active to help prevent return of spasms and malalignment.    Baseline Pt. demonstrates a LLD with RLE long    Time 5    Period Weeks    Status New    Target Date 07/13/20      PT SHORT TERM GOAL #3   Title Patient will demonstrate HEP x1 in the clinic to demonstrate understanding and proper form to allow for further improvement.    Baseline Pt. lacks knowledge of what therapeutic exercises will help decrease her Sx.    Time 5    Period Weeks    Status New    Target Date 07/13/20             PT Long Term Goals - 06/08/20 0923      PT LONG TERM GOAL #1   Title Patient will report no episodes of UI  over the course of the prior two weeks to demonstrate improved functional ability.    Baseline Pt. needing a pad daily for unconscious leakage and has full bladder emptying with vomiting, significant leakage with cough/sneeze/jump.    Time 10    Period Weeks    Status New    Target Date 08/17/20      PT LONG TERM GOAL #2   Title Patient will describe pain no greater than 2/10 during exercise for 1 hour, ADL's to demonstrate improved functional ability.    Baseline Having pain up to  7/10 in LB with ADL's and exercise, not able to run due to plantar fasciitis    Time 10    Period Weeks    Status New    Target Date 08/17/20      PT LONG TERM GOAL #3   Title Pt. will improve in FOTO score by 10 points or maximal score in each category to demonstrate improved function.    Baseline FOTO PFDI Pain: 8, Urinary Problem: 49, PFDI Urinary: 8    Time 10    Period Weeks    Status New    Target Date 08/17/20                 Plan - 08/17/20 0835    Clinical Impression Statement Pt. Responded well to all interventions today, demonstrating improved DR approximation to less than 1 finger above umbilicus, decreased spasm and resolution of pain, as well as understanding and correct performance of all education and exercises provided today. They will continue to benefit from skilled physical therapy to work toward remaining goals and maximize function as well as decrease likelihood of symptom increase or recurrence.    PT Next Visit Plan re-assess PFM and continue PFM release re-assess pelvic alignment, teach child's pose,progress mini-marches PRN.    PT Home Exercise Plan mini-marches, boat-pose, plank, squats, pre-squeeze and sneeze, soda-can theory, self TP release with tennis ball, side-stretch, LB stretch, posterior pelvic tilts for POP, running mechanics, self posterior fourchette and external PFM release, toe-tap/dead-bug.    Consulted and Agree with Plan of Care Patient           Patient  will benefit from skilled therapeutic intervention in order to improve the following deficits and impairments:     Visit Diagnosis: Sacrococcygeal disorders, not elsewhere classified  Other muscle spasm  Facial weakness     Problem List Patient Active Problem List   Diagnosis Date Noted  . Vaginal atrophy 03/13/2020  . Urinary incontinence 03/13/2020  . History of meningitis 05/12/2008   Cleophus Molt DPT, ATC Cleophus Molt 08/17/2020, 8:39 AM  [facility] MAIN [facility] SERVICES [address] [place], [place], [address] Phone: [phone]   Fax:  [phone]  Name: Claudia Rogers MRN: [id number] Date of Birth: 1986/04/25

## 2020-08-17 NOTE — Patient Instructions (Signed)
  Put hands under the tailbone and engage the low tummy as you reach one leg out at a time. Do 2x15

## 2020-08-31 ENCOUNTER — Other Ambulatory Visit: Payer: Self-pay

## 2020-08-31 ENCOUNTER — Ambulatory Visit: Payer: PRIVATE HEALTH INSURANCE

## 2020-08-31 DIAGNOSIS — M533 Sacrococcygeal disorders, not elsewhere classified: Secondary | ICD-10-CM | POA: Diagnosis not present

## 2020-08-31 DIAGNOSIS — R29810 Facial weakness: Secondary | ICD-10-CM

## 2020-08-31 DIAGNOSIS — M62838 Other muscle spasm: Secondary | ICD-10-CM

## 2020-08-31 NOTE — Therapy (Signed)
[facility] [facility] MAIN [facility] SERVICES [address] [place], [place], [address] Phone: [phone]   Fax:  [phone]  Physical Therapy Treatment  The patient has been informed of current processes in place at Outpatient Rehab to protect patients from Covid-19 exposure including social distancing, schedule modifications, and new cleaning procedures. After discussing their particular risk with a therapist based on the patient's personal risk factors, the patient has decided to proceed with in-person therapy.   Patient Details  Name: Claudia Rogers MRN: [id number] Date of Birth: [birth year] No data recorded  Encounter Date: 08/31/2020   PT End of Session - 08/31/20 0841    Visit Number 7    Number of Visits 10    Date for PT Re-Evaluation 08/17/20    Authorization Type Generic    Authorization Time Period 06/08/20 through 08/17/20    Authorization - Visit Number 7    Authorization - Number of Visits 10    Progress Note Due on Visit 10    PT Start Time 0736    PT Stop Time 0836    PT Time Calculation (min) 60 min    Activity Tolerance Patient tolerated treatment well;No increased pain    Behavior During Therapy WFL for tasks assessed/performed           Past Medical History:  Diagnosis Date  . Anxiety   . GDM (gestational diabetes mellitus), class A1 11/10/2019   03/2020: negative postpartum 2h GTT  . H/O viral meningitis   . History of chicken pox     Past Surgical History:  Procedure Laterality Date  . EYE MUSCLE SURGERY Bilateral 1996  . EYE MUSCLE SURGERY Bilateral 2000  . Odontoma Removal/implant  2003  . WISDOM TOOTH EXTRACTION      There were no vitals filed for this visit.   [place] [facility] MAIN [facility] SERVICES [address] [place], [place], [address] Phone: [phone]   Fax:  [phone]  Physical Therapy Treatment  The patient has been informed of current processes in place at Outpatient  Rehab to protect patients from Covid-19 exposure including social distancing, schedule modifications, and new cleaning procedures. After discussing their particular risk with a therapist based on the patient's personal risk factors, the patient has decided to proceed with in-person therapy.  Patient Details  Name: Claudia Rogers MRN: [id number] Date of Birth: 01/28/1986 No data recorded  Encounter Date: 08/31/2020   PT End of Session - 08/31/20 0841    Visit Number 7    Number of Visits 10    Date for PT Re-Evaluation 08/17/20    Authorization Type Generic    Authorization Time Period 06/08/20 through 08/17/20    Authorization - Visit Number 7    Authorization - Number of Visits 10    Progress Note Due on Visit 10    PT Start Time 0736    PT Stop Time 0836    PT Time Calculation (min) 60 min    Activity Tolerance Patient tolerated treatment well;No increased pain    Behavior During Therapy WFL for tasks assessed/performed           Past Medical History:  Diagnosis Date  . Anxiety   . GDM (gestational diabetes mellitus), class A1 11/10/2019   03/2020: negative postpartum 2h GTT  . H/O viral meningitis   . History of chicken pox     Past Surgical History:  Procedure Laterality Date  . EYE MUSCLE SURGERY Bilateral 1996  .  EYE MUSCLE SURGERY Bilateral 2000  . Odontoma Removal/implant  2003  . WISDOM TOOTH EXTRACTION      There were no vitals filed for this visit.  Pelvic Floor Physical Therapy Treatment Note  SCREENING  Changes in medications, allergies, or medical history?: none    SUBJECTIVE  Patient reports: She is feeling a lot better pain-wise, has not needed any pain relievers. She has not been running et. She has not had enough leakage to need to change clothes, has not been wearing a a liner. Has to use the pre-squeeze and sneeze. Has been doing the dead-bugs and mini-marches.  Precautions:  Anxiety, 4 mo. PP  Pain update:   Location of pain: LBP (R  hip) Current pain: 0/10 (0/10) Max pain: 3/10 Least pain: 0/10 Nature of pain:Ache  **no increased pain following treatment  Patient Goals: Not have leakage or pain with ADL's and exercise.   OBJECTIVE  Changes in: Posture/Observations:  06/29/20: RLE long by ~ 3/4cm and rotated forward.  **following treatment PSIS level in standing with heel-lift in LLE. No obliquity remaining.  07/13/20: pelvic alignment not re-assessed due to Pt. Not having heel-lift with her today.  TODAY: ASIS and PSIS level in standing, ASIS level in supine with RLE long. (heel-lift in L shoe)  Range of Motion/Flexibilty:  Decreased fascial mobility through R>L lateral abdomen.  Strength/MMT:  LE MMT:   Pelvic Floor External Exam: Introitus Appears: WNL Skin integrity: WNL Palpation: TTP to STP Cough: parodoxical Prolapse visible?: no Scar mobility: decreased  Internal Vaginal Exam: Strength (PERF): ~ 3/5, 5 sec, 2 times Symmetry: similar Palpation: TTP throughout the PFM  Prolapse: anterior and posterior walls visible at ~1/2 cm above the introitus with bearing down.  TODAY: TTP throughout all other than R PR/PC anteriorly, strength 2+/5 for 4 sec.  ** following treatment some scar restriction remining at 5 o'clock but all other regions non tender and strength is 5/5 for 5 sec. 1 time.  Abdominal:   Palpation: TTP to B QL and L multifidus near L1  Gait Analysis: 07/13/20: Pt. Swings her arms in a rotational/lateral motion and pushes up through DF causing "bobbing" with jogging. RLE slightly ER'ed.  INTERVENTIONS THIS SESSION: Manual: Performed TP release to all internal PFM as well as scar release at 5 o'clock to decrease spasm and pain and allow for improved balance and recruitment of musculature for improved function and decreased symptoms. Reviewed how to get maximal recruitment with exhale during contraction.   Total time: 60  min.                              PT Short Term Goals - 06/08/20 0920      PT SHORT TERM GOAL #1   Title Patient will demonstrate improved pelvic alignment and balance of musculature surrounding the pelvis to facilitate decreased PFM spasms and decrease pelvic pain.    Baseline RLE long/up-slipped    Time 5    Period Weeks    Status New    Target Date 07/13/20      PT SHORT TERM GOAL #2   Title Pt. will be compliant with wearing heel-lift during ~90% of the day while being active to help prevent return of spasms and malalignment.    Baseline Pt. demonstrates a LLD with RLE long    Time 5    Period Weeks    Status New    Target Date 07/13/20  PT SHORT TERM GOAL #3   Title Patient will demonstrate HEP x1 in the clinic to demonstrate understanding and proper form to allow for further improvement.    Baseline Pt. lacks knowledge of what therapeutic exercises will help decrease her Sx.    Time 5    Period Weeks    Status New    Target Date 07/13/20             PT Long Term Goals - 06/08/20 0923      PT LONG TERM GOAL #1   Title Patient will report no episodes of UI over the course of the prior two weeks to demonstrate improved functional ability.    Baseline Pt. needing a pad daily for unconscious leakage and has full bladder emptying with vomiting, significant leakage with cough/sneeze/jump.    Time 10    Period Weeks    Status New    Target Date 08/17/20      PT LONG TERM GOAL #2   Title Patient will describe pain no greater than 2/10 during exercise for 1 hour, ADL's to demonstrate improved functional ability.    Baseline Having pain up to 7/10 in LB with ADL's and exercise, not able to run due to plantar fasciitis    Time 10    Period Weeks    Status New    Target Date 08/17/20      PT LONG TERM GOAL #3   Title Pt. will improve in FOTO score by 10 points or maximal score in each category to demonstrate improved function.    Baseline  FOTO PFDI Pain: 8, Urinary Problem: 49, PFDI Urinary: 8    Time 10    Period Weeks    Status New    Target Date 08/17/20                 Plan - 08/31/20 0842    Clinical Impression Statement Pt. Responded well to all interventions today, demonstrating decreased spasm and greatly improved recruitment/strength from 2+/5 to 4+/5, as well as understanding and correct performance of all education and exercises provided today. They will continue to benefit from skilled physical therapy to work toward remaining goals and maximize function as well as decrease likelihood of symptom increase or recurrence.    PT Next Visit Plan re-assess PFM and continue PFM release re-assess pelvic alignment, teach child's pose,progress mini-marches PRN.    PT Home Exercise Plan mini-marches, boat-pose, plank, squats, pre-squeeze and sneeze, soda-can theory, self TP release with tennis ball, side-stretch, LB stretch, posterior pelvic tilts for POP, running mechanics, self posterior fourchette and external PFM release, toe-tap/dead-bug.    Consulted and Agree with Plan of Care Patient           Patient will benefit from skilled therapeutic intervention in order to improve the following deficits and impairments:     Visit Diagnosis: Sacrococcygeal disorders, not elsewhere classified  Other muscle spasm  Facial weakness     Problem List Patient Active Problem List   Diagnosis Date Noted  . Vaginal atrophy 03/13/2020  . Urinary incontinence 03/13/2020  . History of meningitis 05/12/2008   Cleophus Molt DPT, ATC Cleophus Molt 08/31/2020, 8:55 AM  [place] [facility] MAIN [facility] SERVICES [address] [place], [place], [address] Phone: [phone]   Fax:  [phone]  Name: Claudia Rogers MRN: [id number] Date of Birth: [birth year]

## 2020-09-11 ENCOUNTER — Encounter: Payer: Self-pay | Admitting: Family Medicine

## 2020-09-11 ENCOUNTER — Ambulatory Visit (INDEPENDENT_AMBULATORY_CARE_PROVIDER_SITE_OTHER): Payer: PRIVATE HEALTH INSURANCE | Admitting: Family Medicine

## 2020-09-11 ENCOUNTER — Ambulatory Visit: Payer: PRIVATE HEALTH INSURANCE | Admitting: Family Medicine

## 2020-09-11 ENCOUNTER — Other Ambulatory Visit: Payer: Self-pay

## 2020-09-11 VITALS — BP 118/62 | HR 57 | Temp 96.9°F | Ht 63.0 in | Wt 167.1 lb

## 2020-09-11 DIAGNOSIS — H6982 Other specified disorders of Eustachian tube, left ear: Secondary | ICD-10-CM

## 2020-09-11 DIAGNOSIS — H6980 Other specified disorders of Eustachian tube, unspecified ear: Secondary | ICD-10-CM | POA: Insufficient documentation

## 2020-09-11 DIAGNOSIS — R32 Unspecified urinary incontinence: Secondary | ICD-10-CM

## 2020-09-11 DIAGNOSIS — F418 Other specified anxiety disorders: Secondary | ICD-10-CM

## 2020-09-11 DIAGNOSIS — J301 Allergic rhinitis due to pollen: Secondary | ICD-10-CM | POA: Diagnosis not present

## 2020-09-11 NOTE — Assessment & Plan Note (Signed)
Started as pp depression 3 y ago  Continues lexapro px by gyn- it helps significantly  Reviewed stressors/ coping techniques/symptoms/ support sources/ tx options and side effects in detail today Has 2 young children  Good support

## 2020-09-11 NOTE — Assessment & Plan Note (Signed)
Enc to continue flonase daily through season OM is resolved UC notes reviewed

## 2020-09-11 NOTE — Progress Notes (Signed)
Subjective:    Patient ID: Claudia Rogers, female    DOB: Aug 12, 1985, 35 y.o.   MRN: [id number]  This visit occurred during the SARS-CoV-2 public health emergency.  Safety protocols were in place, including screening questions prior to the visit, additional usage of staff PPE, and extensive cleaning of exam room while observing appropriate contact time as indicated for disinfecting solutions.    HPI 35 yo pt here to re establish care and check ear s/p ear infection  Last visit was 2016  Wt Readings from Last 3 Encounters:  09/11/20 167 lb 2 oz (75.8 kg)  03/13/20 172 lb (78 kg)  01/31/20 196 lb 11.2 oz (89.2 kg)   29.60 kg/m   Recently had baby and is breastfeeding (end of sept)  She had gestational diabetes-diet controlled  Dr Vergie Living is her obgyn   Doing well  Has a 75 yo son also  Getting caught up with check ups   No longer diabetic    H/o pelvic floor problem- did PT  That is going well/will be done soon , very helpful   Pt had a recent ear infection  She was seen in UC for otalgia 4/5 for L ear pain - (after tx for ear infection) on L tx with augmentin  Dx with ETD tx with prednisone  Zyrtec daily  flonase daily (now 2 sprays daily) -sticking with it  Improved now   No ear pain  Cancelled ENT appt   Had covid mid January (whole family)  Not vaccinated yet- waiting until done with preg/baby care  Not hospitalized    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 Depression with anxiety 09/11/2020  . ETD (eustachian tube dysfunction) 09/11/2020  . Vaginal atrophy 03/13/2020  . Urinary incontinence 03/13/2020  . Seasonal allergic rhinitis 11/14/2014  .  History of meningitis 05/12/2008   Past Medical History:  Diagnosis Date  . Anxiety   . GDM (gestational diabetes mellitus), class A1 11/10/2019   03/2020: negative postpartum 2h GTT  . H/O viral meningitis   . History of chicken pox    Past Surgical History:  Procedure Laterality Date  . EYE MUSCLE SURGERY Bilateral 1996  . EYE MUSCLE SURGERY Bilateral 2000  . Odontoma Removal/implant  2003  . WISDOM TOOTH EXTRACTION     Social History   Tobacco Use  . Smoking status: Never Smoker  . Smokeless tobacco: Never Used  Vaping Use  . Vaping Use: Never used  Substance Use Topics  . Alcohol use: Not Currently    Alcohol/week: 0.0 standard drinks    Comment: Social  . Drug use: No   Family History  Problem Relation Age of Onset  . Arthritis Mother   . High blood pressure Mother   . Diabetes Mother        Type II  . Lung cancer Father   . Arthritis Maternal Grandmother   . Stroke Maternal Grandmother   . High blood pressure Maternal Grandmother   . Diabetes Maternal Grandmother        Type II  . Alcoholism Maternal Aunt   . Alcoholism Maternal  Uncle   . Alcoholism Cousin   . Lung cancer Paternal Aunt   . High blood pressure Sister   . Kidney disease Maternal Aunt   . Mental illness Sister   . Diabetes Brother        Type II-BKA  . Heart disease Neg Hx   . Cancer Neg Hx    No Known Allergies Current Outpatient Medications on File Prior to Visit  Medication Sig Dispense Refill  . escitalopram (LEXAPRO) 20 MG tablet Take 1 tablet (20 mg total) by mouth daily. 90 tablet 3  . fluticasone (FLONASE) 50 MCG/ACT nasal spray Place 2 sprays into both nostrils daily.    . Prenatal Vit-Fe Fumarate-FA (PRENATAL VITAMIN) 27-0.8 MG TABS Take 1 tablet by mouth daily.      No current facility-administered medications on file prior to visit.     Review of Systems  Constitutional: Negative for activity change, appetite change, fatigue, fever and unexpected weight change.  HENT:  Negative for congestion, ear pain, rhinorrhea, sinus pressure and sore throat.   Eyes: Negative for pain, redness and visual disturbance.  Respiratory: Negative for cough, shortness of breath and wheezing.   Cardiovascular: Negative for chest pain and palpitations.  Gastrointestinal: Negative for abdominal pain, blood in stool, constipation and diarrhea.  Endocrine: Negative for polydipsia and polyuria.  Genitourinary: Negative for dysuria, frequency and urgency.  Musculoskeletal: Negative for arthralgias, back pain and myalgias.  Skin: Negative for pallor and rash.  Allergic/Immunologic: Negative for environmental allergies.  Neurological: Negative for dizziness, syncope and headaches.  Hematological: Negative for adenopathy. Does not bruise/bleed easily.  Psychiatric/Behavioral: Negative for decreased concentration and dysphoric mood. The patient is not nervous/anxious.        Objective:   Physical Exam Constitutional:      General: She is not in acute distress.    Appearance: Normal appearance. She is well-developed and normal weight. She is not ill-appearing.  HENT:     Head: Normocephalic and atraumatic.     Right Ear: Tympanic membrane, ear canal and external ear normal. There is no impacted cerumen.     Left Ear: Tympanic membrane, ear canal and external ear normal. There is no impacted cerumen.     Nose:     Comments: Boggy nares    Mouth/Throat:     Mouth: Mucous membranes are moist.  Eyes:     General: No scleral icterus.    Conjunctiva/sclera: Conjunctivae normal.     Pupils: Pupils are equal, round, and reactive to light.  Neck:     Thyroid: No thyromegaly.     Vascular: No carotid bruit or JVD.  Cardiovascular:     Rate and Rhythm: Regular rhythm. Bradycardia present.     Heart sounds: Normal heart sounds. No gallop.   Pulmonary:     Effort: Pulmonary effort is normal. No respiratory distress.     Breath sounds: Normal breath sounds. No wheezing or rales.   Abdominal:     General: Abdomen is flat. Bowel sounds are normal. There is no abdominal bruit.  Musculoskeletal:     Cervical back: Normal range of motion and neck supple. No tenderness.     Right lower leg: No edema.     Left lower leg: No edema.  Lymphadenopathy:     Cervical: No cervical adenopathy.  Skin:    General: Skin is warm and dry.     Coloration: Skin is not pale.     Findings: No erythema or rash.  Neurological:  Mental Status: She is alert.     Cranial Nerves: No cranial nerve deficit.     Coordination: Coordination normal.     Deep Tendon Reflexes: Reflexes are normal and symmetric.  Psychiatric:        Mood and Affect: Mood normal.        Cognition and Memory: Cognition and memory normal.           Assessment & Plan:   Problem List Items Addressed This Visit      Respiratory   Seasonal allergic rhinitis - Primary    Enc to continue flonase ns daily through allergy season for vertigo and ETD        Nervous and Auditory   ETD (eustachian tube dysfunction)    Enc to continue flonase daily through season OM is resolved UC notes reviewed        Other   Urinary incontinence    Pelvic floor PT has helped significantly  Was ordered by gyn      Depression with anxiety    Started as pp depression 3 y ago  Continues lexapro px by gyn- it helps significantly  Reviewed stressors/ coping techniques/symptoms/ support sources/ tx options and side effects in detail today Has 2 young children  Good support

## 2020-09-11 NOTE — Assessment & Plan Note (Signed)
Enc to continue flonase ns daily through allergy season for vertigo and ETD

## 2020-09-11 NOTE — Patient Instructions (Signed)
Continue flonase daily through your allergy season  Let us know if ear symptoms return   Take care of yourself

## 2020-09-11 NOTE — Assessment & Plan Note (Signed)
Pelvic floor PT has helped significantly  Was ordered by gyn

## 2020-09-14 ENCOUNTER — Other Ambulatory Visit: Payer: Self-pay

## 2020-09-14 ENCOUNTER — Ambulatory Visit: Payer: PRIVATE HEALTH INSURANCE | Attending: Family Medicine

## 2020-09-14 DIAGNOSIS — M533 Sacrococcygeal disorders, not elsewhere classified: Secondary | ICD-10-CM | POA: Insufficient documentation

## 2020-09-14 DIAGNOSIS — R29810 Facial weakness: Secondary | ICD-10-CM | POA: Diagnosis present

## 2020-09-14 DIAGNOSIS — M62838 Other muscle spasm: Secondary | ICD-10-CM | POA: Diagnosis present

## 2020-09-14 NOTE — Therapy (Signed)
[facility] MAIN [facility] SERVICES [address] [place], [place], [address] Phone: [phone]   Fax:  [phone]  Physical Therapy Treatment and Discharge Summary  The patient has been informed of current processes in place at Outpatient Rehab to protect patients from Covid-19 exposure including social distancing, schedule modifications, and new cleaning procedures. After discussing their particular risk with a therapist based on the patient's personal risk factors, the patient has decided to proceed with in-person therapy.  Patient Details  Name: Claudia Rogers MRN: [id number] Date of Birth: 08-25-1985 No data recorded  Encounter Date: 09/14/2020   PT End of Session - 09/14/20 0747    Visit Number 8    Number of Visits 10    Date for PT Re-Evaluation 08/17/20    Authorization Type Generic    Authorization Time Period 06/08/20 through 08/17/20    Authorization - Visit Number 8    Authorization - Number of Visits 10    Progress Note Due on Visit 10    PT Start Time 0734    PT Stop Time 0830    PT Time Calculation (min) 56 min    Activity Tolerance Patient tolerated treatment well;No increased pain    Behavior During Therapy WFL for tasks assessed/performed           Past Medical History:  Diagnosis Date  . Anxiety   . GDM (gestational diabetes mellitus), class A1 11/10/2019   03/2020: negative postpartum 2h GTT  . H/O viral meningitis   . History of chicken pox     Past Surgical History:  Procedure Laterality Date  . EYE MUSCLE SURGERY Bilateral 1996  . EYE MUSCLE SURGERY Bilateral 2000  . Odontoma Removal/implant  2003  . WISDOM TOOTH EXTRACTION      There were no vitals filed for this visit.  Pelvic Floor Physical Therapy Treatment Note  SCREENING  Changes in medications, allergies, or medical history?: none    SUBJECTIVE  Patient reports: She is training for a 10k in November. Has slight urge to pee when she starts to run  but it goes away. She has not had to take any pain medicine and she is sleeping through the night. She has not had any leakage with ADL's or exercise in 2 weeks.  Precautions:  Anxiety, 4 mo. PP  Pain update:   Location of pain: LBP (R hip) Current pain: 0/10 (0/10) Max pain: 0/10 Least pain: 0/10 Nature of pain:Ache  **no increased pain following treatment  Patient Goals: Not have leakage or pain with ADL's and exercise.   OBJECTIVE  Changes in: Posture/Observations:  06/29/20: RLE long by ~ 3/4cm and rotated forward.  **following treatment PSIS level in standing with heel-lift in LLE. No obliquity remaining.  07/13/20: pelvic alignment not re-assessed due to Pt. Not having heel-lift with her today.  08/31/20: ASIS and PSIS level in standing, ASIS level in supine with RLE long. (heel-lift in L shoe)  Range of Motion/Flexibilty:  Decreased fascial mobility through R>L lateral abdomen.  Strength/MMT:  LE MMT:   Pelvic Floor External Exam: Introitus Appears: WNL Skin integrity: WNL Palpation: TTP to STP Cough: parodoxical Prolapse visible?: no Scar mobility: decreased  Internal Vaginal Exam: Strength (PERF): ~ 3/5, 5 sec, 2 times Symmetry: similar Palpation: TTP throughout the PFM  Prolapse: anterior and posterior walls visible at ~1/2 cm above the introitus with bearing down.  08/31/20: TTP throughout all other than R PR/PC anteriorly, strength 2+/5 for 4 sec. ** following  treatment some scar restriction remining at 5 o'clock but all other regions non tender and strength is 5/5 for 5 sec. 1 time.  TODAY: TTP to L Bulbo and STP externally and to B anterior PR/PC and IC internally with some scar restriction at ~ 5 o'clock.  **no remaining TTP or scar restriction following treatment. ~4/5 strength for 3-5 sec. with cue to pick blueberries and nod the clitoris. MIN VC,TC needed to help pt. Coordinate deep-core with the PFM but she was able to demonstrate 3 in a row  with good strength at EOS.   Abdominal:   Palpation: TTP to B QL and L multifidus near L1 (from prior session)  Gait Analysis: 07/13/20: Pt. Swings her arms in a rotational/lateral motion and pushes up through DF causing "bobbing" with jogging. RLE slightly ER'ed.  INTERVENTIONS THIS SESSION: Manual: Performed TP release to  L Bulbo and STP externally and to B anterior PR/PC and IC as well as scar release at 5 o'clock to decrease spasm and pain and allow for improved balance and recruitment of musculature for improved function and decreased symptoms. Reviewed how to get maximal recruitment with exhale during contraction and cue to "pick the blueberries and nod the clitoris".  Self-care: Reviewed progress, goals, and POC and decided to D/C to HEP.  Therex: Educated on and practiced Kegels with both long-holds and quick-flicks and reviewed the importance of doing them regularly as she is training for her 5k and 10k to prevent muscular imbalance leading to a return of UI.   Total time: 60 min.                               PT Short Term Goals - 09/14/20 0741      PT SHORT TERM GOAL #1   Title Patient will demonstrate improved pelvic alignment and balance of musculature surrounding the pelvis to facilitate decreased PFM spasms and decrease pelvic pain.    Baseline RLE long/up-slipped    Time 5    Period Weeks    Status Achieved    Target Date 07/13/20      PT SHORT TERM GOAL #2   Title Pt. will be compliant with wearing heel-lift during ~90% of the day while being active to help prevent return of spasms and malalignment.    Baseline Pt. demonstrates a LLD with RLE long    Time 5    Period Weeks    Status Achieved    Target Date 07/13/20      PT SHORT TERM GOAL #3   Title Patient will demonstrate HEP x1 in the clinic to demonstrate understanding and proper form to allow for further improvement.    Baseline Pt. lacks knowledge of what therapeutic exercises  will help decrease her Sx.    Time 5    Period Weeks    Status Achieved    Target Date 07/13/20             PT Long Term Goals - 09/14/20 0742      PT LONG TERM GOAL #1   Title Patient will report no episodes of UI over the course of the prior two weeks to demonstrate improved functional ability.    Baseline Pt. needing a pad daily for unconscious leakage and has full bladder emptying with vomiting, significant leakage with cough/sneeze/jump.    Time 10    Period Weeks    Status Achieved    Target Date  08/17/20      PT LONG TERM GOAL #2   Title Patient will describe pain no greater than 2/10 during exercise for 1 hour, ADL's to demonstrate improved functional ability.    Baseline Having pain up to 7/10 in LB with ADL's and exercise, not able to run due to plantar fasciitis    Time 10    Period Weeks    Status Achieved    Target Date 08/17/20      PT LONG TERM GOAL #3   Title Pt. will improve in FOTO score by 10 points or maximal score in each category to demonstrate improved function.    Baseline FOTO PFDI Pain: 8, Urinary Problem: 49, PFDI Urinary: 8 As of 09/14/20: FOTO PFDI Pain: 0, Urinary Problem: 75, PFDI Urinary:0.    Time 10    Period Weeks    Status Achieved    Target Date 08/17/20                 Plan - 09/14/20 0748    Clinical Impression Statement Pt. has achieved all goals and responded well to all interventions today, demonstrating decreased scar restriction and resolution of spasms. She will benefit from further PFM strengthening but has sufficient coordination to allow for home practice. She feels confident that she will continue to get stronger and maintain using her HEP and strengthening. We will D/C today to HEP.    PT Next Visit Plan re-assess PFM and continue PFM release re-assess pelvic alignment, teach child's pose,progress mini-marches PRN.    PT Home Exercise Plan mini-marches, boat-pose, plank, squats, pre-squeeze and sneeze, soda-can theory,  self TP release with tennis ball, side-stretch, LB stretch, posterior pelvic tilts for POP, running mechanics, self posterior fourchette and external PFM release, toe-tap/dead-bug, kegels.    Consulted and Agree with Plan of Care Patient           Patient will benefit from skilled therapeutic intervention in order to improve the following deficits and impairments:     Visit Diagnosis: Sacrococcygeal disorders, not elsewhere classified  Other muscle spasm  Facial weakness     Problem List Patient Active Problem List   Diagnosis Date Noted  . Depression with anxiety 09/11/2020  . ETD (eustachian tube dysfunction) 09/11/2020  . Vaginal atrophy 03/13/2020  . Urinary incontinence 03/13/2020  . Seasonal allergic rhinitis 11/14/2014  . History of meningitis 05/12/2008   Cleophus Molt DPT, ATC Cleophus Molt 09/14/2020, 9:09 AM  [facility] MAIN [facility] SERVICES [address] [place], [place], [address] Phone: [phone]   Fax:  [phone]  Name: Claudia Rogers MRN: [id number] Date of Birth: 10-14-1985

## 2020-09-14 NOTE — Patient Instructions (Signed)
Kegel exercises:    With neutral spine, tighten pelvic floor by imagining you are stopping the flow of urine, squeezing only around the vagina and anus.  Quick-Flicks: Pull up and in quickly and then relax allowing just enough time for the muscles to full lengthen before the next contraction. Do _10__ repetitions in a row, stopping if the pelvic floor muscle gets tired and other muscles try to take over.  Long-Holds: Hold for _5__ seconds and then fully release, repeat _5__ times. (only hold until you can feel the muscles "give up") (work up to 10x10sec)  Repeat both of these exercises _3-5__ times throughout the day     If this is difficult, start by performing while lying down as shown. As you get stronger, you can try in sitting, standing, etc. Make sure that you are NOT contracting your buttocks or inner thigh muscles!  Tip: If you are having a hard time feeling the muscles, try using a hand-held mirror to observe the muscles lift and draw in or feel with your hand to make sure you are squeezing when you think you are squeezing, not bearing down. Some people find it helpful to pretend you are "picking blueberries" with the vagina to feel the drawing in or try to make the clitoris "nod" and pull your sitz bones in toward the middle.

## 2020-10-02 ENCOUNTER — Other Ambulatory Visit: Payer: Self-pay | Admitting: Family Medicine

## 2020-10-19 ENCOUNTER — Encounter: Payer: PRIVATE HEALTH INSURANCE | Admitting: Physical Therapy

## 2020-10-30 ENCOUNTER — Other Ambulatory Visit: Payer: Self-pay

## 2020-10-30 ENCOUNTER — Ambulatory Visit
Admission: EM | Admit: 2020-10-30 | Discharge: 2020-10-30 | Disposition: A | Discharge status: Home / Self Care | Payer: PRIVATE HEALTH INSURANCE | Attending: Emergency Medicine | Admitting: Emergency Medicine

## 2020-10-30 ENCOUNTER — Encounter: Payer: Self-pay | Admitting: Emergency Medicine

## 2020-10-30 DIAGNOSIS — J029 Acute pharyngitis, unspecified: Secondary | ICD-10-CM | POA: Diagnosis not present

## 2020-10-30 DIAGNOSIS — B349 Viral infection, unspecified: Secondary | ICD-10-CM | POA: Diagnosis not present

## 2020-10-30 LAB — POCT RAPID STREP A (OFFICE): Rapid Strep A Screen: NEGATIVE

## 2020-10-30 NOTE — ED Triage Notes (Signed)
Pt presents today with c/o sore throat and bilateral ear pain x 3 days. Denies fever.

## 2020-10-30 NOTE — Discharge Instructions (Addendum)
Your rapid strep test is negative.  A throat culture is pending; we will call you if it is positive requiring treatment.    Your COVID test is pending.  You should self quarantine until the test result is back.    Take Tylenol or ibuprofen as needed for fever or discomfort.  Rest and keep yourself hydrated.    Follow-up with your primary care provider if your symptoms are not improving.     

## 2020-10-30 NOTE — ED Provider Notes (Signed)
Renaldo Fiddler    CSN: [id number] Arrival date & time: 10/30/20  0806      History   Chief Complaint Chief Complaint  Patient presents with   Sore Throat   Otalgia     HPI Claudia Rogers is a 35 y.o. female.   Patient presents with 3-day history of bilateral ear pain and sore throat.  She also reports several episodes of diarrhea yesterday but none today.  She denies fever, chills, rash, cough, shortness of breath, vomiting, or other symptoms.  Treatment attempted at home with Tylenol.  Her medical history includes seasonal allergies, eustachian tube dysfunction, anxiety, depression.    The history is provided by the patient and medical records.   Past Medical History:  Diagnosis Date   Anxiety    GDM (gestational diabetes mellitus), class A1 11/10/2019   03/2020: negative postpartum 2h GTT   H/O viral meningitis    History of chicken pox     Patient Active Problem List   Diagnosis Date Noted   Depression with anxiety 09/11/2020   ETD (eustachian tube dysfunction) 09/11/2020   Vaginal atrophy 03/13/2020   Urinary incontinence 03/13/2020   Seasonal allergic rhinitis 11/14/2014   History of meningitis 05/12/2008    Past Surgical History:  Procedure Laterality Date   EYE MUSCLE SURGERY Bilateral 1996   EYE MUSCLE SURGERY Bilateral 2000   Odontoma Removal/implant  2003   WISDOM TOOTH EXTRACTION      OB History     Gravida  4   Para  2   Term  2   Preterm      AB  2   Living  2      SAB  2   IAB      Ectopic      Multiple  0   Live Births  2            Home Medications    Prior to Admission medications   Medication Sig Start Date End Date Taking? Authorizing Provider  acetaminophen (TYLENOL) 500 MG tablet Take 500 mg by mouth every 6 (six) hours as needed.   Yes [provider]  cetirizine (ZYRTEC) 10 MG tablet Take 10 mg by mouth daily.   Yes [provider]  ibuprofen (ADVIL) 200 MG tablet Take 200 mg by  mouth every 6 (six) hours as needed.   Yes [provider]  escitalopram (LEXAPRO) 20 MG tablet TAKE 1 TABLET BY MOUTH EVERY DAY 10/02/20   Reva Bores, MD  fluticasone [facility]) 50 MCG/ACT nasal spray Place 2 sprays into both nostrils daily.    [provider]  Prenatal Vit-Fe Fumarate-FA (PRENATAL VITAMIN) 27-0.8 MG TABS Take 1 tablet by mouth daily.     [provider]    Family History Family History  Problem Relation Age of Onset   Arthritis Mother    High blood pressure Mother    Diabetes Mother        Type II   Lung cancer Father    Arthritis Maternal Grandmother    Stroke Maternal Grandmother    High blood pressure Maternal Grandmother    Diabetes Maternal Grandmother        Type II   Alcoholism Maternal Aunt    Alcoholism Maternal Uncle    Alcoholism Cousin    Lung cancer Paternal Aunt    High blood pressure Sister    Kidney disease Maternal Aunt    Mental illness Sister    Diabetes  Brother        Type II-BKA   Heart disease Neg Hx    Cancer Neg Hx     Social History Social History   Tobacco Use   Smoking status: Never   Smokeless tobacco: Never  Vaping Use   Vaping Use: Never used  Substance Use Topics   Alcohol use: Not Currently    Alcohol/week: 0.0 standard drinks    Comment: Social   Drug use: No     Allergies   Patient has no known allergies.   Review of Systems Review of Systems  Constitutional:  Negative for chills and fever.  HENT:  Positive for ear pain and sore throat.   Respiratory:  Negative for cough and shortness of breath.   Cardiovascular:  Negative for chest pain and palpitations.  Gastrointestinal:  Positive for diarrhea. Negative for abdominal pain, nausea and vomiting.  Skin:  Negative for color change and rash.  All other systems reviewed and are negative.   Physical Exam Triage Vital Signs ED Triage Vitals  Enc Vitals Group     BP      Pulse      Resp      Temp      Temp src      SpO2       Weight      Height      Head Circumference      Peak Flow      Pain Score      Pain Loc      Pain Edu?      Excl. in GC?    No data found.  Updated Vital Signs BP (!) 106/57 (BP Location: Left Arm)   Pulse 73   Temp 98 F (36.7 C) (Oral)   Resp 18   Wt 171 lb (77.6 kg)   LMP 10/03/2020 (Exact Date)   SpO2 96%   Breastfeeding Yes   BMI 30.29 kg/m   Visual Acuity Right Eye Distance:   Left Eye Distance:   Bilateral Distance:    Right Eye Near:   Left Eye Near:    Bilateral Near:     Physical Exam Vitals and nursing note reviewed.  Constitutional:      General: She is not in acute distress.    Appearance: She is well-developed. She is not ill-appearing.  HENT:     Head: Normocephalic and atraumatic.     Right Ear: Tympanic membrane normal.     Left Ear: Tympanic membrane normal.     Nose: Nose normal.     Mouth/Throat:     Mouth: Mucous membranes are moist.     Pharynx: Oropharynx is clear.  Eyes:     Conjunctiva/sclera: Conjunctivae normal.  Cardiovascular:     Rate and Rhythm: Normal rate and regular rhythm.     Heart sounds: Normal heart sounds.  Pulmonary:     Effort: Pulmonary effort is normal. No respiratory distress.     Breath sounds: Normal breath sounds.  Abdominal:     Palpations: Abdomen is soft.     Tenderness: There is no abdominal tenderness.  Musculoskeletal:     Cervical back: Neck supple.  Skin:    General: Skin is warm and dry.     Findings: No rash.  Neurological:     General: No focal deficit present.     Mental Status: She is alert and oriented to person, place, and time.     Gait: Gait normal.  Psychiatric:  Mood and Affect: Mood normal.        Behavior: Behavior normal.     UC Treatments / Results  Labs (all labs ordered are listed, but only abnormal results are displayed) Labs Reviewed  CULTURE, GROUP A STREP ([facility])  NOVEL CORONAVIRUS, NAA  POCT RAPID STREP A (OFFICE)    EKG   Radiology No results  found.  Procedures Procedures (including critical care time)  Medications Ordered in UC Medications - No data to display  Initial Impression / Assessment and Plan / UC Course  I have reviewed the triage vital signs and the nursing notes.  Pertinent labs & imaging results that were available during my care of the patient were reviewed by me and considered in my medical decision making (see chart for details).   Sore throat, Viral illness.  Rapid strep negative; culture pending.  COVID pending.  Instructed patient to self quarantine per CDC guidelines.  Discussed symptomatic treatment including Tylenol or ibuprofen, rest, hydration.  Instructed patient to follow up with PCP if symptoms are not improving.  Patient agrees to plan of care.    Final Clinical Impressions(s) / UC Diagnoses   Final diagnoses:  Sore throat  Viral illness     Discharge Instructions      Your rapid strep test is negative.  A throat culture is pending; we will call you if it is positive requiring treatment.    Your COVID test is pending.  You should self quarantine until the test result is back.    Take Tylenol or ibuprofen as needed for fever or discomfort.  Rest and keep yourself hydrated.    Follow-up with your primary care provider if your symptoms are not improving.         ED Prescriptions   None    PDMP not reviewed this encounter.   Mickie Bail, NP 10/30/20 0900

## 2020-10-31 LAB — SARS-COV-2, NAA 2 DAY TAT

## 2020-10-31 LAB — NOVEL CORONAVIRUS, NAA: SARS-CoV-2, NAA: NOT DETECTED

## 2020-11-02 ENCOUNTER — Encounter: Payer: PRIVATE HEALTH INSURANCE | Admitting: Physical Therapy

## 2020-11-02 LAB — CULTURE, GROUP A STREP (THRC)

## 2021-04-16 ENCOUNTER — Encounter: Payer: Self-pay | Admitting: Family Medicine

## 2021-04-16 ENCOUNTER — Ambulatory Visit (INDEPENDENT_AMBULATORY_CARE_PROVIDER_SITE_OTHER): Payer: PRIVATE HEALTH INSURANCE | Admitting: Family Medicine

## 2021-04-16 ENCOUNTER — Other Ambulatory Visit: Payer: Self-pay

## 2021-04-16 VITALS — BP 92/60 | HR 73 | Temp 98.2°F | Ht 63.0 in | Wt 170.5 lb

## 2021-04-16 DIAGNOSIS — H6501 Acute serous otitis media, right ear: Secondary | ICD-10-CM | POA: Diagnosis not present

## 2021-04-16 MED ORDER — AMOXICILLIN-POT CLAVULANATE 875-125 MG PO TABS: 1.0000 | ORAL_TABLET | Freq: Two times a day (BID) | ORAL | 0 refills | Status: DC

## 2021-04-16 NOTE — Assessment & Plan Note (Signed)
Recommended trial of flonase to treat ETD.Marland Kitchen if not resolving treat with broadened antibiotics ( Augmentin)

## 2021-04-16 NOTE — Progress Notes (Signed)
Patient ID: GENNAVIEVE HUQ, female    DOB: [birth year], 35 y.o.   MRN: [id number]  This visit was conducted in person.  BP 92/60   Pulse 73   Temp 98.2 F (36.8 C) (Temporal)   Ht 5\' 3"  (1.6 m)   Wt 170 lb 8 oz (77.3 kg)   LMP 04/14/2021   SpO2 98%   BMI 30.20 kg/m    CC:  Chief Complaint  Patient presents with   Ear Pain    Right-Finished Amoxicillin a week ago for sinus infection    Subjective:   HPI: NYJA WESTBROOK is a 35 y.o. female presenting on 04/16/2021 for Ear Pain (Right-Finished Amoxicillin a week ago for sinus infection)  Date  of onset of symptoms:  11/21 Had tele medicine visit thorough insurance. Treated with Amoxicillin x 10 day  for presumed sinus infection.  Green nasal discharge, bilateral frontal pain, bilateral ear pain.  No fever.  Nasal congestion resolved.. ear pain never resolved.. still had right ear pain, ST.  Pediatrician saw her child... looked in her ear told likely infection.  Children sick.. neg for COVIID and flu      Relevant past medical, surgical, family and social history reviewed and updated as indicated. Interim medical history since our last visit reviewed. Allergies and medications reviewed and updated. Outpatient Medications Prior to Visit  Medication Sig Dispense Refill   acetaminophen (TYLENOL) 500 MG tablet Take 500 mg by mouth every 6 (six) hours as needed.     cetirizine (ZYRTEC) 10 MG tablet Take 10 mg by mouth daily.     escitalopram (LEXAPRO) 20 MG tablet TAKE 1 TABLET BY MOUTH EVERY DAY 90 tablet 3   fluticasone (FLONASE) 50 MCG/ACT nasal spray Place 2 sprays into both nostrils daily.     ibuprofen (ADVIL) 200 MG tablet Take 200 mg by mouth every 6 (six) hours as needed.     Prenatal Vit-Fe Fumarate-FA (PRENATAL VITAMIN) 27-0.8 MG TABS Take 1 tablet by mouth daily.      No facility-administered medications prior to visit.     Per HPI unless specifically indicated in ROS section below Review of Systems   Constitutional:  Negative for fatigue and fever.  HENT:  Positive for ear pain.   Eyes:  Negative for pain.  Respiratory:  Negative for chest tightness and shortness of breath.   Cardiovascular:  Negative for chest pain, palpitations and leg swelling.  Gastrointestinal:  Negative for abdominal pain.  Genitourinary:  Negative for dysuria.  Objective:  BP 92/60   Pulse 73   Temp 98.2 F (36.8 C) (Temporal)   Ht 5\' 3"  (1.6 m)   Wt 170 lb 8 oz (77.3 kg)   LMP 04/14/2021   SpO2 98%   BMI 30.20 kg/m   Wt Readings from Last 3 Encounters:  04/16/21 170 lb 8 oz (77.3 kg)  10/30/20 171 lb (77.6 kg)  09/11/20 167 lb 2 oz (75.8 kg)      Physical Exam Constitutional:      General: She is not in acute distress.    Appearance: Normal appearance. She is well-developed. She is not ill-appearing or toxic-appearing.  HENT:     Head: Normocephalic.     Right Ear: Hearing, ear canal and external ear normal. A middle ear effusion is present. Tympanic membrane is erythematous. Tympanic membrane is not retracted. Tympanic membrane has normal mobility.     Left Ear: Hearing, ear canal and external ear normal. A middle ear effusion  is present. Tympanic membrane is not erythematous, retracted or bulging. Tympanic membrane has normal mobility.     Nose: No mucosal edema or rhinorrhea.     Right Sinus: No maxillary sinus tenderness or frontal sinus tenderness.     Left Sinus: No maxillary sinus tenderness or frontal sinus tenderness.     Mouth/Throat:     Pharynx: Uvula midline.  Eyes:     General: Lids are normal. Lids are everted, no foreign bodies appreciated.     Conjunctiva/sclera: Conjunctivae normal.     Pupils: Pupils are equal, round, and reactive to light.  Neck:     Thyroid: No thyroid mass or thyromegaly.     Vascular: No carotid bruit.     Trachea: Trachea normal.  Cardiovascular:     Rate and Rhythm: Normal rate and regular rhythm.     Pulses: Normal pulses.     Heart sounds:  Normal heart sounds, S1 normal and S2 normal. No murmur heard.   No friction rub. No gallop.  Pulmonary:     Effort: Pulmonary effort is normal. No tachypnea or respiratory distress.     Breath sounds: Normal breath sounds. No decreased breath sounds, wheezing, rhonchi or rales.  Abdominal:     General: Bowel sounds are normal.     Palpations: Abdomen is soft.     Tenderness: There is no abdominal tenderness.  Musculoskeletal:     Cervical back: Normal range of motion and neck supple.  Skin:    General: Skin is warm and dry.     Findings: No rash.  Neurological:     Mental Status: She is alert.  Psychiatric:        Mood and Affect: Mood is not anxious or depressed.        Speech: Speech normal.        Behavior: Behavior normal. Behavior is cooperative.        Thought Content: Thought content normal.        Judgment: Judgment normal.      Results for orders placed or performed during the hospital encounter of 10/30/20  Culture, group A strep   Specimen: Throat  Result Value Ref Range   Specimen Description THROAT    Special Requests NONE    Culture      NO GROUP A STREP (S.PYOGENES) ISOLATED Performed at [facility], [address] [place], [place] [address]    Report Status 11/02/2020 FINAL   Novel Coronavirus, NAA ([facility])   Specimen: Nasopharyngeal(NP) swabs in vial transport medium   Nasopharynge  Result Value Ref Range   SARS-CoV-2, NAA Not Detected Not Detected  SARS-COV-2, NAA 2 DAY TAT   Nasopharynge  Result Value Ref Range   SARS-CoV-2, NAA 2 DAY TAT Performed   POCT rapid strep A  Result Value Ref Range   Rapid Strep A Screen Negative Negative    This visit occurred during the SARS-CoV-2 public health emergency.  Safety protocols were in place, including screening questions prior to the visit, additional usage of staff PPE, and extensive cleaning of exam room while observing appropriate contact time as indicated for disinfecting solutions.   COVID  19 screen:  No recent travel or known exposure to COVID19 The patient denies respiratory symptoms of COVID 19 at this time. The importance of social distancing was discussed today.   Assessment and Plan    Problem List Items Addressed This Visit     Non-recurrent acute serous otitis media of right ear -  Primary     Recommended trial of flonase to treat ETD.Marland Kitchen if not resolving treat with broadened antibiotics ( Augmentin)      Relevant Medications   amoxicillin-clavulanate (AUGMENTIN) 875-125 MG tablet   Meds ordered this encounter  Medications   amoxicillin-clavulanate (AUGMENTIN) 875-125 MG tablet    Sig: Take 1 tablet by mouth 2 (two) times daily.    Dispense:  20 tablet    Refill:  0     Kerby Nora, MD

## 2021-04-16 NOTE — Patient Instructions (Signed)
Start with flonase 2 sprays per nostril daily.Marland Kitchen if not improving in 3-4 days fill antibiotics for right ear infection.

## 2021-06-26 ENCOUNTER — Ambulatory Visit: Payer: PRIVATE HEALTH INSURANCE | Admitting: Obstetrics and Gynecology

## 2021-07-25 ENCOUNTER — Ambulatory Visit: Payer: PRIVATE HEALTH INSURANCE | Admitting: Obstetrics and Gynecology

## 2021-08-28 ENCOUNTER — Encounter: Payer: Self-pay | Admitting: Obstetrics and Gynecology

## 2021-08-28 ENCOUNTER — Ambulatory Visit (INDEPENDENT_AMBULATORY_CARE_PROVIDER_SITE_OTHER): Payer: PRIVATE HEALTH INSURANCE | Admitting: Obstetrics and Gynecology

## 2021-08-28 ENCOUNTER — Other Ambulatory Visit (HOSPITAL_COMMUNITY)
Admission: RE | Admit: 2021-08-28 | Discharge: 2021-08-28 | Disposition: A | Discharge status: Home / Self Care | Payer: PRIVATE HEALTH INSURANCE | Source: Ambulatory Visit | Attending: Obstetrics and Gynecology | Admitting: Obstetrics and Gynecology

## 2021-08-28 VITALS — BP 108/74 | HR 78 | Ht 63.0 in | Wt 170.0 lb

## 2021-08-28 DIAGNOSIS — Z01419 Encounter for gynecological examination (general) (routine) without abnormal findings: Secondary | ICD-10-CM

## 2021-08-28 DIAGNOSIS — N3946 Mixed incontinence: Secondary | ICD-10-CM | POA: Diagnosis not present

## 2021-08-28 NOTE — Progress Notes (Signed)
?Obstetrics and Gynecology ?Annual Patient Evaluation ? ?Appointment Date: 08/28/2021 ? ?OB[facility]N Clinic: [facility] ? ?Primary Care Provider: Roxy Manns A ? ?Chief Complaint:  ?Chief Complaint  ?Patient presents with  ? Gynecologic Exam  ? ? ?History of Present Illness: Claudia Rogers is a 36 y.o. Caucasian [id number] (Patient's last menstrual period was 08/10/2021 (exact date).), seen for the above chief complaint. Her past medical history is significant for h/o anxiety/depression ? ?Patient restarted pelvic floor PT due to mixed UI with SUI and OAB. She did pelvic floor PT in the past (last vag delivery 2021) and it helped some but she had never been able to run without leakage. S/s got worse recently so she went back to PT; pt denies any lower urinary tract s/s.  ? ?Review of Systems: Pertinent items noted in HPI and remainder of comprehensive ROS otherwise negative.  ? ?Patient Active Problem List  ? Diagnosis Date Noted  ? Non-recurrent acute serous otitis media of right ear 04/16/2021  ? Depression with anxiety 09/11/2020  ? ETD (eustachian tube dysfunction) 09/11/2020  ? Urinary incontinence 03/13/2020  ? Seasonal allergic rhinitis 11/14/2014  ? History of meningitis 05/12/2008  ? ? ?Past Medical History:  ?Past Medical History:  ?Diagnosis Date  ? Anxiety   ? GDM (gestational diabetes mellitus), class A1 11/10/2019  ? 03/2020: negative postpartum 2h GTT  ? H/O viral meningitis   ? History of chicken pox   ? ? ?Past Surgical History:  ?Past Surgical History:  ?Procedure Laterality Date  ? EYE MUSCLE SURGERY Bilateral 1996  ? EYE MUSCLE SURGERY Bilateral 2000  ? Odontoma Removal/implant  2003  ? WISDOM TOOTH EXTRACTION    ? ? ?Past Obstetrical History:  ?OB History  ?Gravida Para Term Preterm AB Living  ?4 2 2   2 2   ?SAB IAB Ectopic Multiple Live Births  ?2     0 2  ?  ?# Outcome Date GA Lbr Len/2nd Weight Sex Delivery Anes PTL Lv  ?4 Term 02/01/20 [redacted]w[redacted]d / 01:34 7 lb 15.7 oz (3.62  kg) M Vag-Spont EPI  LIV  ?   Birth Comments: WDL  ?3 SAB 05/01/19 [redacted]w[redacted]d         ?2 Term 04/11/16 [redacted]w[redacted]d  7 lb 4 oz (3.289 kg) M Vag-Spont  N LIV  ?1 SAB 07/20/14 [redacted]w[redacted]d       FD  ? ?Past Gynecological History: As per HPI. ?Periods: qmonth, regular, approximately one week, not heavy or painful ?History of Pap Smear(s): Yes.   Last pap 2018, which was negative ?She is currently using vasectomy for contraception.  ? ?Social History:  ?Social History  ? ?Socioeconomic History  ? Marital status: Married  ?  Spouse name: Not on file  ? Number of children: Not on file  ? Years of education: Not on file  ? Highest education level: Not on file  ?Occupational History  ? Not on file  ?Tobacco Use  ? Smoking status: Never  ? Smokeless tobacco: Never  ?Vaping Use  ? Vaping Use: Never used  ?Substance and Sexual Activity  ? Alcohol use: Not Currently  ?  Alcohol/week: 0.0 standard drinks  ?  Comment: Social  ? Drug use: No  ? Sexual activity: Yes  ?  Partners: Male  ?  Birth control/protection: None  ?  Comment: Vasectomy  ?Other Topics Concern  ? Not on file  ?Social History Narrative  ? Not on file  ? ?Social Determinants of  Health  ? ?Financial Resource Strain: Not on file  ?Food Insecurity: Not on file  ?Transportation Needs: Not on file  ?Physical Activity: Not on file  ?Stress: Not on file  ?Social Connections: Not on file  ?Intimate Partner Violence: Not on file  ? ? ?Family History:  ?Family History  ?Problem Relation Age of Onset  ? Arthritis Mother   ? High blood pressure Mother   ? Diabetes Mother   ?     Type II  ? Lung cancer Father   ? Arthritis Maternal Grandmother   ? Stroke Maternal Grandmother   ? High blood pressure Maternal Grandmother   ? Diabetes Maternal Grandmother   ?     Type II  ? Alcoholism Maternal Aunt   ? Alcoholism Maternal Uncle   ? Alcoholism Cousin   ? Lung cancer Paternal Aunt   ? High blood pressure Sister   ? Kidney disease Maternal Aunt   ? Mental illness Sister   ? Diabetes Brother   ?      Type II-BKA  ? Heart disease Neg Hx   ? Cancer Neg Hx   ? ? ?Medications ?Claudia Rogers had no medications administered during this visit. ?Current Outpatient Medications  ?Medication Sig Dispense Refill  ? escitalopram (LEXAPRO) 20 MG tablet TAKE 1 TABLET BY MOUTH EVERY DAY 90 tablet 3  ? cetirizine (ZYRTEC) 10 MG tablet Take 10 mg by mouth daily. (Patient not taking: Reported on 08/28/2021)    ? fluticasone (FLONASE) 50 MCG/ACT nasal spray Place 2 sprays into both nostrils daily. (Patient not taking: Reported on 08/28/2021)    ? ?No current facility-administered medications for this visit.  ? ? ?Allergies ?Patient has no known allergies. ? ? ?Physical Exam:  ?BP 108/74   Pulse 78   Ht 5\' 3"  (1.6 m)   Wt 170 lb (77.1 kg)   LMP 08/10/2021 (Exact Date)   BMI 30.11 kg/m?  Body mass index is 30.11 kg/m?. ? ?General appearance: Well nourished, well developed female in no acute distress.  ?Neck:  Supple, normal appearance, and no thyromegaly  ?Cardiovascular: normal s1 and s2.  No murmurs, rubs or gallops. ?Respiratory:  Clear to auscultation bilateral. Normal respiratory effort ?Abdomen: positive bowel sounds and no masses, hernias; diffusely non tender to palpation, non distended ?Breasts: breasts appear normal, no suspicious masses, no skin or nipple changes or axillary nodes, and normal palpation. ?Neuro/Psych:  Normal mood and affect.  ?Skin:  Warm and dry.  ?Lymphatic:  No inguinal lymphadenopathy.  ? ?Pelvic exam: is not limited by body habitus ?EGBUS: within normal limits ?Vagina: within normal limits and with no blood or discharge in the vault ?Cervix: normal appearing cervix without tenderness, discharge or lesions. ?Uterus:  nonenlarged and non tender ?Adnexa:  normal adnexa and no mass, fullness, tenderness ?Rectovaginal: deferred ? ?Laboratory: none ? ?Radiology: none ? ?Assessment: pt stable ? ?Plan:  ?1. Well woman exam with routine gynecological exam ?- Cytology - PAP ?- Ambulatory referral to  Urogynecology ? ?2. Urinary incontinence, mixed ?Pt amenable to referral ?- Ambulatory referral to Urogynecology ? ?RTC PRN ? ?10/10/2021 MD ?Attending ?[facility] [employer]) ? ?

## 2021-08-28 NOTE — Progress Notes (Signed)
Patient presents for Annual Exam. ? ?LMP 08/10/2021 cycles last 5 days ? ?Last pap 09/25/2016 ? ?CC: Leaking with sneezing, coughing etc currently doing pelvic floor therapy  ?

## 2021-08-29 LAB — CYTOLOGY - PAP
Comment: NEGATIVE
Diagnosis: NEGATIVE
High risk HPV: NEGATIVE

## 2021-10-02 ENCOUNTER — Encounter: Payer: Self-pay | Admitting: Family Medicine

## 2021-10-02 ENCOUNTER — Ambulatory Visit (INDEPENDENT_AMBULATORY_CARE_PROVIDER_SITE_OTHER): Payer: PRIVATE HEALTH INSURANCE | Admitting: Family Medicine

## 2021-10-02 DIAGNOSIS — J209 Acute bronchitis, unspecified: Secondary | ICD-10-CM | POA: Insufficient documentation

## 2021-10-02 MED ORDER — ALBUTEROL SULFATE HFA 108 (90 BASE) MCG/ACT IN AERS: 2.0000 | INHALATION_SPRAY | Freq: Four times a day (QID) | RESPIRATORY_TRACT | 1 refills | Status: DC | PRN

## 2021-10-02 MED ORDER — PREDNISONE 10 MG PO TABS: ORAL_TABLET | ORAL | 0 refills | Status: DC

## 2021-10-02 NOTE — Progress Notes (Signed)
Subjective:    Patient ID: Claudia Rogers, female    DOB: 09/29/1985, 36 y.o.   MRN: [id number]  HPI 36 yo pt of Dr Diona Browner presents for nasal congestion and cough   Wt Readings from Last 3 Encounters:  10/02/21 166 lb 2 oz (75.4 kg)  08/28/21 170 lb (77.1 kg)  04/16/21 170 lb 8 oz (77.3 kg)   29.43 kg/m   Symptoms started 3 wk ago with conjunctivitis  Then ST and a cold (more chest than head)  She did a telehealth visit  On augmentin - sinusitis /? Bacterial  Tessalon -helps only a little   Now more chest discomfort with cough  Hurts to take a deep breath-in last 24 hours Some wheezing     Cough is prod of thick yellow green mucous   No n/v No fever   Otc mucinex- plain   (the DM makes her loopy)  Ny quil at night    Patient Active Problem List   Diagnosis Date Noted   Acute bronchitis 10/02/2021   Urinary incontinence, mixed 08/28/2021   Non-recurrent acute serous otitis media of right ear 04/16/2021   Depression with anxiety 09/11/2020   ETD (eustachian tube dysfunction) 09/11/2020   Urinary incontinence 03/13/2020   Seasonal allergic rhinitis 11/14/2014   History of meningitis 05/12/2008   Past Medical History:  Diagnosis Date   Anxiety    GDM (gestational diabetes mellitus), class A1 11/10/2019   03/2020: negative postpartum 2h GTT   H/O viral meningitis    History of chicken pox    Past Surgical History:  Procedure Laterality Date   EYE MUSCLE SURGERY Bilateral 1996   EYE MUSCLE SURGERY Bilateral 2000   Odontoma Removal/implant  2003   WISDOM TOOTH EXTRACTION     Social History   Tobacco Use   Smoking status: Never   Smokeless tobacco: Never  Vaping Use   Vaping Use: Never used  Substance Use Topics   Alcohol use: Not Currently    Alcohol/week: 0.0 standard drinks    Comment: Social   Drug use: No   Family History  Problem Relation Age of Onset   Arthritis Mother    High blood pressure Mother    Diabetes Mother        Type II    Lung cancer Father    Arthritis Maternal Grandmother    Stroke Maternal Grandmother    High blood pressure Maternal Grandmother    Diabetes Maternal Grandmother        Type II   Alcoholism Maternal Aunt    Alcoholism Maternal Uncle    Alcoholism Cousin    Lung cancer Paternal Aunt    High blood pressure Sister    Kidney disease Maternal Aunt    Mental illness Sister    Diabetes Brother        Type II-BKA   Heart disease Neg Hx    Cancer Neg Hx    No Known Allergies Current Outpatient Medications on File Prior to Visit  Medication Sig Dispense Refill   amoxicillin-clavulanate (AUGMENTIN) 875-125 MG tablet Take 1 tablet by mouth 2 (two) times daily.     benzonatate (TESSALON) 100 MG capsule Take 1 capsule by mouth 3 (three) times daily as needed.     cetirizine (ZYRTEC) 10 MG tablet Take 10 mg by mouth daily.     escitalopram (LEXAPRO) 20 MG tablet TAKE 1 TABLET BY MOUTH EVERY DAY 90 tablet 3   fluticasone (FLONASE) 50 MCG/ACT nasal spray  Place 2 sprays into both nostrils daily.     No current facility-administered medications on file prior to visit.    Review of Systems  Constitutional:  Positive for fatigue. Negative for activity change, appetite change, fever and unexpected weight change.  HENT:  Positive for sore throat. Negative for congestion, ear pain, rhinorrhea and sinus pressure.   Eyes:  Negative for pain, redness and visual disturbance.  Respiratory:  Positive for cough, chest tightness, wheezing and stridor. Negative for choking and shortness of breath.   Cardiovascular:  Positive for chest pain. Negative for palpitations.  Gastrointestinal:  Negative for abdominal pain, blood in stool, constipation and diarrhea.  Endocrine: Negative for polydipsia and polyuria.  Genitourinary:  Negative for dysuria, frequency and urgency.  Musculoskeletal:  Negative for arthralgias, back pain and myalgias.  Skin:  Negative for pallor and rash.  Allergic/Immunologic: Negative for  environmental allergies.  Neurological:  Negative for dizziness, syncope and headaches.  Hematological:  Negative for adenopathy. Does not bruise/bleed easily.  Psychiatric/Behavioral:  Negative for decreased concentration and dysphoric mood. The patient is not nervous/anxious.       Objective:   Physical Exam Constitutional:      General: She is not in acute distress.    Appearance: Normal appearance. She is well-developed. She is not ill-appearing, toxic-appearing or diaphoretic.     Comments: overwt  HENT:     Head: Normocephalic and atraumatic.     Comments: Nares are injected and congested      Right Ear: Tympanic membrane, ear canal and external ear normal.     Left Ear: Tympanic membrane, ear canal and external ear normal.     Nose: Rhinorrhea present. No congestion.     Comments: Boggy nares     Mouth/Throat:     Mouth: Mucous membranes are moist.     Pharynx: Oropharynx is clear. No oropharyngeal exudate or posterior oropharyngeal erythema.     Comments: Clear pnd  Eyes:     General:        Right eye: No discharge.        Left eye: No discharge.     Conjunctiva/sclera: Conjunctivae normal.     Pupils: Pupils are equal, round, and reactive to light.  Cardiovascular:     Rate and Rhythm: Normal rate.     Heart sounds: Normal heart sounds.  Pulmonary:     Effort: Pulmonary effort is normal. No respiratory distress.     Breath sounds: No stridor. Rhonchi present. No wheezing or rales.     Comments: Scattered rhonchi  Scant wheeze on forced exp only, otherwise good air exch   Not sob with speech  Chest:     Chest wall: No tenderness.  Musculoskeletal:     Cervical back: Normal range of motion and neck supple.  Lymphadenopathy:     Cervical: No cervical adenopathy.  Skin:    General: Skin is warm and dry.     Capillary Refill: Capillary refill takes less than 2 seconds.     Findings: No rash.  Neurological:     Mental Status: She is alert.     Cranial Nerves: No  cranial nerve deficit.  Psychiatric:        Mood and Affect: Mood normal.          Assessment & Plan:   Problem List Items Addressed This Visit       Respiratory   Acute bronchitis    S/p uri  Taking augmentin /day 3 for ?  Sinusitis from virtual visit  Some rhonchi on exam   Px albuterol mdi (pt knows how to use) prn Prednisone 40 mg taper (discussed side effects)   Fluids/rest sympt care/ guaifenesin prn Watch for fever or worse cp  Watch for sob/worse wheeze  ER precautions reviewed Update if not starting to improve in a week or if worsening

## 2021-10-02 NOTE — Patient Instructions (Addendum)
Finish the augmentin   Take prednisone as directed  It may make you hyper and hungry   Drink lots of fluids  Rest when you can  Use the inhaler as needed   If symptoms get severe, go to the ER (trouble breathing)  Update if not starting to improve in a week or if worsening

## 2021-10-02 NOTE — Assessment & Plan Note (Signed)
S/p uri  Taking augmentin /day 3 for ? Sinusitis from virtual visit  Some rhonchi on exam   Px albuterol mdi (pt knows how to use) prn Prednisone 40 mg taper (discussed side effects)   Fluids/rest sympt care/ guaifenesin prn Watch for fever or worse cp  Watch for sob/worse wheeze  ER precautions reviewed Update if not starting to improve in a week or if worsening

## 2021-10-05 ENCOUNTER — Other Ambulatory Visit: Payer: Self-pay | Admitting: Family Medicine

## 2021-10-14 ENCOUNTER — Other Ambulatory Visit: Payer: Self-pay | Admitting: *Deleted

## 2021-10-15 ENCOUNTER — Encounter: Payer: Self-pay | Admitting: Family Medicine

## 2021-10-15 ENCOUNTER — Ambulatory Visit (INDEPENDENT_AMBULATORY_CARE_PROVIDER_SITE_OTHER)
Admission: RE | Admit: 2021-10-15 | Discharge: 2021-10-15 | Disposition: A | Discharge status: Home / Self Care | Payer: PRIVATE HEALTH INSURANCE | Source: Ambulatory Visit | Attending: Family Medicine | Admitting: Family Medicine

## 2021-10-15 ENCOUNTER — Ambulatory Visit (INDEPENDENT_AMBULATORY_CARE_PROVIDER_SITE_OTHER): Payer: PRIVATE HEALTH INSURANCE | Admitting: Family Medicine

## 2021-10-15 ENCOUNTER — Telehealth: Payer: Self-pay

## 2021-10-15 VITALS — BP 104/70 | HR 86 | Ht 63.0 in | Wt 163.0 lb

## 2021-10-15 DIAGNOSIS — J209 Acute bronchitis, unspecified: Secondary | ICD-10-CM

## 2021-10-15 DIAGNOSIS — Z0000 Encounter for general adult medical examination without abnormal findings: Secondary | ICD-10-CM

## 2021-10-15 DIAGNOSIS — O24410 Gestational diabetes mellitus in pregnancy, diet controlled: Secondary | ICD-10-CM | POA: Diagnosis not present

## 2021-10-15 DIAGNOSIS — F418 Other specified anxiety disorders: Secondary | ICD-10-CM | POA: Diagnosis not present

## 2021-10-15 DIAGNOSIS — Z8632 Personal history of gestational diabetes: Secondary | ICD-10-CM

## 2021-10-15 LAB — COMPREHENSIVE METABOLIC PANEL
ALT: 11 U/L (ref 0–35)
AST: 14 U/L (ref 0–37)
Albumin: 4.2 g/dL (ref 3.5–5.2)
Alkaline Phosphatase: 31 U/L — ABNORMAL LOW (ref 39–117)
BUN: 17 mg/dL (ref 6–23)
CO2: 27 mEq/L (ref 19–32)
Calcium: 9.2 mg/dL (ref 8.4–10.5)
Chloride: 103 mEq/L (ref 96–112)
Creatinine, Ser: 0.7 mg/dL (ref 0.40–1.20)
GFR: 112.39 mL/min (ref >60.00)
Glucose, Bld: 91 mg/dL (ref 70–99)
Potassium: 3.9 mEq/L (ref 3.5–5.1)
Sodium: 136 mEq/L (ref 135–145)
Total Bilirubin: 0.6 mg/dL (ref 0.2–1.2)
Total Protein: 6.5 g/dL (ref 6.0–8.3)

## 2021-10-15 LAB — CBC WITH DIFFERENTIAL/PLATELET
Basophils Absolute: 0.1 10*3/uL (ref 0.0–0.1)
Basophils Relative: 1.1 % (ref 0.0–3.0)
Eosinophils Absolute: 0.3 10*3/uL (ref 0.0–0.7)
Eosinophils Relative: 3.3 % (ref 0.0–5.0)
HCT: 38.2 % (ref 36.0–46.0)
Hemoglobin: 12.8 g/dL (ref 12.0–15.0)
Lymphocytes Relative: 28.5 % (ref 12.0–46.0)
Lymphs Abs: 2.4 10*3/uL (ref 0.7–4.0)
MCHC: 33.5 g/dL (ref 30.0–36.0)
MCV: 94.7 fl (ref 78.0–100.0)
Monocytes Absolute: 0.7 10*3/uL (ref 0.1–1.0)
Monocytes Relative: 7.6 % (ref 3.0–12.0)
Neutro Abs: 5.1 10*3/uL (ref 1.4–7.7)
Neutrophils Relative %: 59.5 % (ref 43.0–77.0)
Platelets: 255 10*3/uL (ref 150.0–400.0)
RBC: 4.03 Mil/uL (ref 3.87–5.11)
RDW: 12.2 % (ref 11.5–15.5)
WBC: 8.6 10*3/uL (ref 4.0–10.5)

## 2021-10-15 LAB — LIPID PANEL
Cholesterol: 178 mg/dL (ref 0–200)
HDL: 46.2 mg/dL (ref >39.00)
LDL Cholesterol: 108 mg/dL — ABNORMAL HIGH (ref 0–99)
NonHDL: 131.36
Total CHOL/HDL Ratio: 4
Triglycerides: 117 mg/dL (ref 0.0–149.0)
VLDL: 23.4 mg/dL (ref 0.0–40.0)

## 2021-10-15 LAB — HEMOGLOBIN A1C: Hgb A1c MFr Bld: 5.2 % (ref 4.6–6.5)

## 2021-10-15 LAB — TSH: TSH: 1.09 u[IU]/mL (ref 0.35–5.50)

## 2021-10-15 NOTE — Assessment & Plan Note (Signed)
a1c added to labs Past gest dm/controlled with diet

## 2021-10-15 NOTE — Assessment & Plan Note (Signed)
Improved with prednisone/augmentin and albuterol Now some residual cough and R chest wall soreness Nl exam cxr ordered/pending  Disc cough sympt control and ER precautions

## 2021-10-15 NOTE — Telephone Encounter (Signed)
I left a message for the patient to return my call.

## 2021-10-15 NOTE — Assessment & Plan Note (Signed)
Reviewed stressors/ coping techniques/symptoms/ support sources/ tx options and side effects in detail today  Doing well with lexapro and wants to continue it 20 mg daily  Encouraged good self care

## 2021-10-15 NOTE — Assessment & Plan Note (Signed)
Reviewed health habits including diet and exercise and skin cancer prevention Reviewed appropriate screening tests for age  Also reviewed health mt list, fam hx and immunization status , as well as social and family history   See HPI Good health habits, urged to continue weight watchers  Pap nl with neg HPV in April Encouraged self breast exams  Declines flu shot  fam h/o cardiomyopathy ni sister  Taking vit D3

## 2021-10-15 NOTE — Progress Notes (Signed)
Subjective:    Patient ID: Claudia Rogers, female    DOB: 01/18/1986, 36 y.o.   MRN: [id number]  HPI Here for health maintenance exam and to review chronic medical problems   Also f/u of bronchitis from 5/24 visit   Wt Readings from Last 3 Encounters:  10/15/21 163 lb (73.9 kg)  10/02/21 166 lb 2 oz (75.4 kg)  08/28/21 170 lb (77.1 kg)   28.87 kg/m Weight watchers since may Lost 8 lb so far  This works well for her   Last visit tx with prednisone 40 mg taper  Albuterol Was taking augmentin   Better then she was  Still prod cough  Prod green phlegm  Chest is sore from a month of coughing (R anterior, worse if she leans forward)  Cannot always catch breath all the way but not rattling  Does not think wheezing  No fever  Uses inhaler occ   Nquil otc  Mucinex DM   General health wise doing well  Trying to eat healthy  Has a gym with child care - really likes it  Goes 3 or more times per week   Immunization History  Administered Date(s) Administered   Influenza,inj,Quad PF,6+ Mos 01/23/2016   Tdap 01/10/2016   Pap 08/2021- nl with neg HPV/ gyn  Was ref to urogyn for mixed incontinence at that time /also pelvic floor therapy Appt is in the fall   Self breast exam - no lumps   Utd hep C and HIV screening   BP Readings from Last 3 Encounters:  10/15/21 104/70  10/02/21 122/68  08/28/21 108/74   Pulse Readings from Last 3 Encounters:  10/15/21 86  10/02/21 86  08/28/21 78   Family history -sister with some heart issues (hypertrophic cardiomyopathy) with CHF  Has HTN also  Mother-HTN also   Depression/anxiety Takes lexapro 20 mg - really helping Doing well   Taking vitamin D3    -      10/15/2021    8:05 AM 11/30/2019    2:18 PM 12/16/2016   10:29 AM 11/18/2016   11:54 AM  Depression screen PHQ 2/9  Decreased Interest 0 0 0 2  Down, Depressed, Hopeless 0 0 1 2  PHQ - 2 Score 0 0 1 4  Altered sleeping   1 3  Tired, decreased energy   1 3  Change  in appetite   0 1  Feeling bad or failure about yourself    0 3  Trouble concentrating   1 2  Moving slowly or fidgety/restless   0 1  Suicidal thoughts   0 1  PHQ-9 Score   4 18   H/o gestational diabetes Diet controlled Mother has DM   Patient Active Problem List   Diagnosis Date Noted   Acute bronchitis 10/02/2021   Urinary incontinence, mixed 08/28/2021   Depression with anxiety 09/11/2020   Urinary incontinence 03/13/2020   Gestational diabetes 11/10/2019   Seasonal allergic rhinitis 11/14/2014   Routine general medical examination at a health care facility 05/03/2014   History of meningitis 05/12/2008   Past Medical History:  Diagnosis Date   Anxiety    GDM (gestational diabetes mellitus), class A1 11/10/2019   03/2020: negative postpartum 2h GTT   H/O viral meningitis    History of chicken pox    Past Surgical History:  Procedure Laterality Date   EYE MUSCLE SURGERY Bilateral 1996   EYE MUSCLE SURGERY Bilateral 2000   Odontoma Removal/implant  2003  WISDOM TOOTH EXTRACTION     Social History   Tobacco Use   Smoking status: Never   Smokeless tobacco: Never  Vaping Use   Vaping Use: Never used  Substance Use Topics   Alcohol use: Not Currently    Alcohol/week: 0.0 standard drinks    Comment: Social   Drug use: No   Family History  Problem Relation Age of Onset   Arthritis Mother    High blood pressure Mother    Diabetes Mother        Type II   Lung cancer Father    Hypertrophic cardiomyopathy Sister    High blood pressure Sister    Mental illness Sister    Diabetes Brother        Type II-BKA   Arthritis Maternal Grandmother    Stroke Maternal Grandmother    High blood pressure Maternal Grandmother    Diabetes Maternal Grandmother        Type II   Alcoholism Maternal Aunt    Kidney disease Maternal Aunt    Alcoholism Maternal Uncle    Lung cancer Paternal Aunt    Alcoholism Cousin    Heart disease Neg Hx    Cancer Neg Hx    No Known  Allergies Current Outpatient Medications on File Prior to Visit  Medication Sig Dispense Refill   albuterol (VENTOLIN HFA) 108 (90 Base) MCG/ACT inhaler Inhale 2 puffs into the lungs every 6 (six) hours as needed for wheezing or shortness of breath. 8 g 1   cetirizine (ZYRTEC) 10 MG tablet Take 10 mg by mouth daily.     escitalopram (LEXAPRO) 20 MG tablet TAKE 1 TABLET BY MOUTH EVERY DAY 90 tablet 1   fluticasone (FLONASE) 50 MCG/ACT nasal spray Place 2 sprays into both nostrils daily.     No current facility-administered medications on file prior to visit.     Review of Systems  Constitutional:  Negative for activity change, appetite change, fatigue, fever and unexpected weight change.  HENT:  Negative for congestion, ear pain, rhinorrhea, sinus pressure and sore throat.   Eyes:  Negative for pain, redness and visual disturbance.  Respiratory:  Negative for cough, shortness of breath and wheezing.   Cardiovascular:  Negative for chest pain and palpitations.  Gastrointestinal:  Negative for abdominal pain, blood in stool, constipation and diarrhea.  Endocrine: Negative for polydipsia and polyuria.  Genitourinary:  Negative for dysuria, frequency and urgency.  Musculoskeletal:  Negative for arthralgias, back pain and myalgias.  Skin:  Negative for pallor and rash.  Allergic/Immunologic: Negative for environmental allergies.  Neurological:  Negative for dizziness, syncope and headaches.  Hematological:  Negative for adenopathy. Does not bruise/bleed easily.  Psychiatric/Behavioral:  Negative for decreased concentration and dysphoric mood. The patient is not nervous/anxious.        Mood is good       Objective:   Physical Exam Constitutional:      General: She is not in acute distress.    Appearance: Normal appearance. She is well-developed and normal weight. She is not ill-appearing or diaphoretic.  HENT:     Head: Normocephalic and atraumatic.     Right Ear: Tympanic membrane, ear  canal and external ear normal.     Left Ear: Tympanic membrane, ear canal and external ear normal.     Nose: Nose normal. No congestion.     Mouth/Throat:     Mouth: Mucous membranes are moist.     Pharynx: Oropharynx is clear. No posterior oropharyngeal  erythema.  Eyes:     General: No scleral icterus.    Extraocular Movements: Extraocular movements intact.     Conjunctiva/sclera: Conjunctivae normal.     Pupils: Pupils are equal, round, and reactive to light.  Neck:     Thyroid: No thyromegaly.     Vascular: No carotid bruit or JVD.  Cardiovascular:     Rate and Rhythm: Normal rate and regular rhythm.     Pulses: Normal pulses.     Heart sounds: Normal heart sounds.    No gallop.  Pulmonary:     Effort: Pulmonary effort is normal. No respiratory distress.     Breath sounds: Normal breath sounds. No wheezing.     Comments: Good air exch Chest:     Chest wall: No tenderness.  Abdominal:     General: Bowel sounds are normal. There is no distension or abdominal bruit.     Palpations: Abdomen is soft. There is no mass.     Tenderness: There is no abdominal tenderness.     Hernia: No hernia is present.  Genitourinary:    Comments: Breast and pelvic exams done by gyn provider   Musculoskeletal:        General: No tenderness. Normal range of motion.     Cervical back: Normal range of motion and neck supple. No rigidity. No muscular tenderness.     Right lower leg: No edema.     Left lower leg: No edema.     Comments: No kyphosis   Lymphadenopathy:     Cervical: No cervical adenopathy.  Skin:    General: Skin is warm and dry.     Coloration: Skin is not pale.     Findings: No erythema or rash.     Comments: Solar lentigines diffusely   Neurological:     Mental Status: She is alert. Mental status is at baseline.     Cranial Nerves: No cranial nerve deficit.     Motor: No abnormal muscle tone.     Coordination: Coordination normal.     Gait: Gait normal.     Deep Tendon  Reflexes: Reflexes are normal and symmetric. Reflexes normal.  Psychiatric:        Mood and Affect: Mood normal. Mood is not anxious or depressed.        Cognition and Memory: Cognition and memory normal.          Assessment & Plan:   Problem List Items Addressed This Visit       Respiratory   Acute bronchitis    Improved with prednisone/augmentin and albuterol Now some residual cough and R chest wall soreness Nl exam cxr ordered/pending  Disc cough sympt control and ER precautions        Relevant Orders   DG Chest 2 View (Completed)     Endocrine   Gestational diabetes    a1c added to labs Past gest dm/controlled with diet        Relevant Orders   Hemoglobin A1c     Other   Depression with anxiety    Reviewed stressors/ coping techniques/symptoms/ support sources/ tx options and side effects in detail today  Doing well with lexapro and wants to continue it 20 mg daily  Encouraged good self care        Routine general medical examination at a health care facility - Primary    Reviewed health habits including diet and exercise and skin cancer prevention Reviewed appropriate screening tests for age  Also  reviewed health mt list, fam hx and immunization status , as well as social and family history   See HPI Good health habits, urged to continue weight watchers  Pap nl with neg HPV in April Encouraged self breast exams  Declines flu shot  fam h/o cardiomyopathy ni sister  Taking vit D3       Relevant Orders   TSH   Lipid panel   Comprehensive metabolic panel   CBC with Differential/Platelet

## 2021-10-15 NOTE — Patient Instructions (Addendum)
Take care of yourself   Mucinex DM is good for cough and congestion  Drink fluids If wheezing /tight -let us know   Chest xray now  We will notify you with a result   Make sure you get a balance diet with calcium and vitamin D  Take 2000 iu vitamin D3 daily

## 2021-10-15 NOTE — Telephone Encounter (Signed)
-----   Message from Judy Pimple, MD sent at 10/15/2021  2:05 PM EDT ----- Your chest xray looks good  No pneumonia  Let us know if the cough does not gradually improve

## 2021-12-06 IMAGING — US US OB COMP LESS 14 WK
1 series · 15 of 28 positions shown · non-contrast
Comparison: 04/19/2019

CLINICAL DATA: 33-year-old pregnant female with 1st trimester
vaginal bleeding.

EXAM:
OBSTETRIC <14 WK ULTRASOUND
TECHNIQUE: Transabdominal ultrasound was performed for evaluation of the
gestation as well as the maternal uterus and adnexal regions.

[Series 1: us ob comp less 14 wk · 15 of 53 slices shown]
[im 1/53]
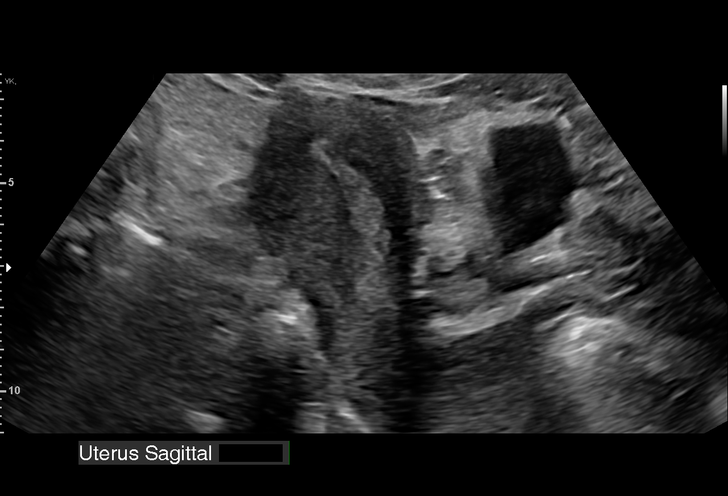
[im 4/53]
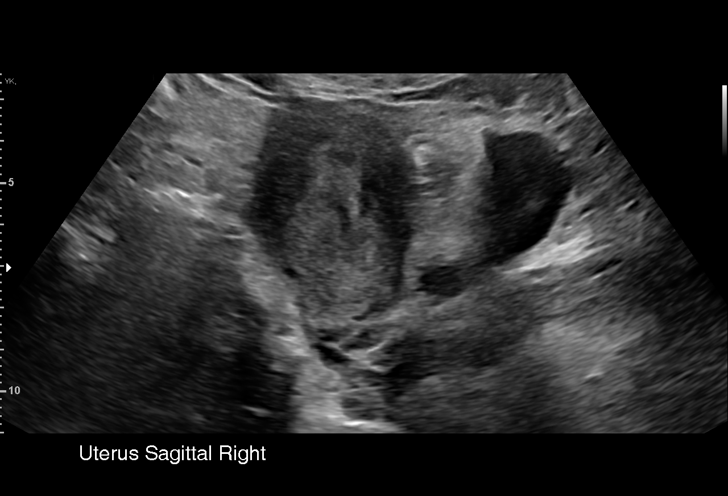
[im 8/53]
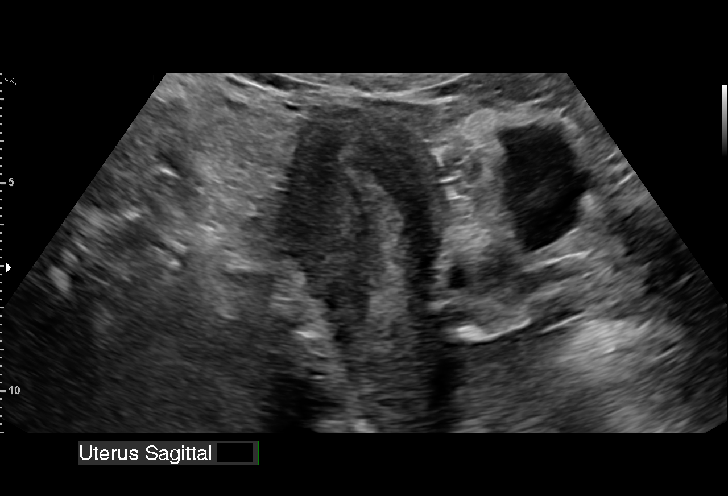
[im 12/53]
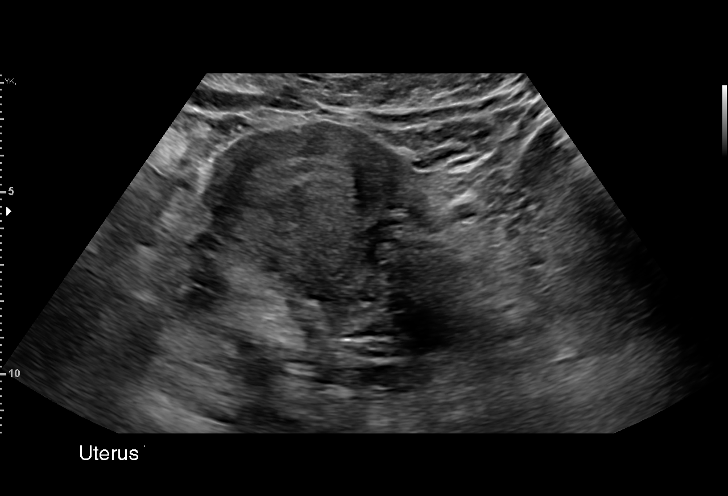
[im 16/53]
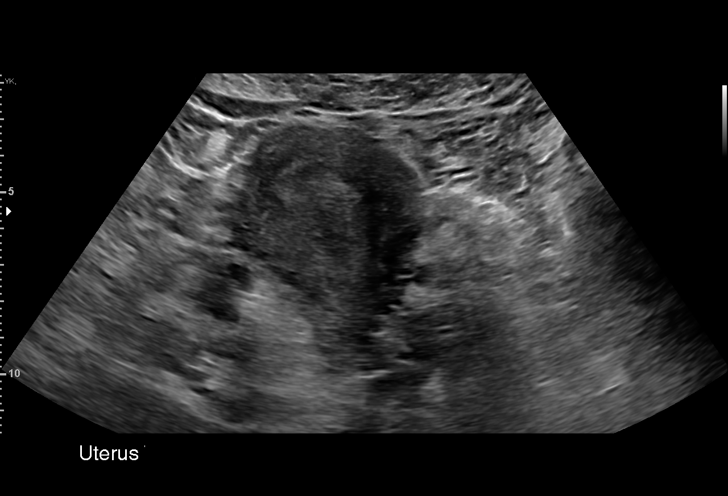
[im 20/53]
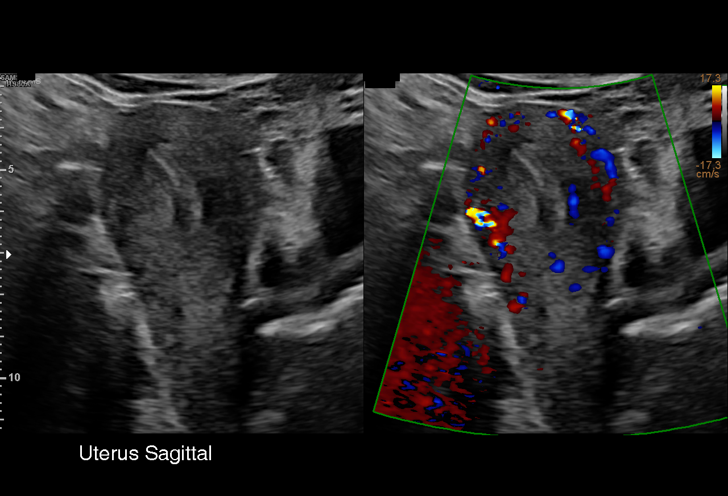
[im 24/53]
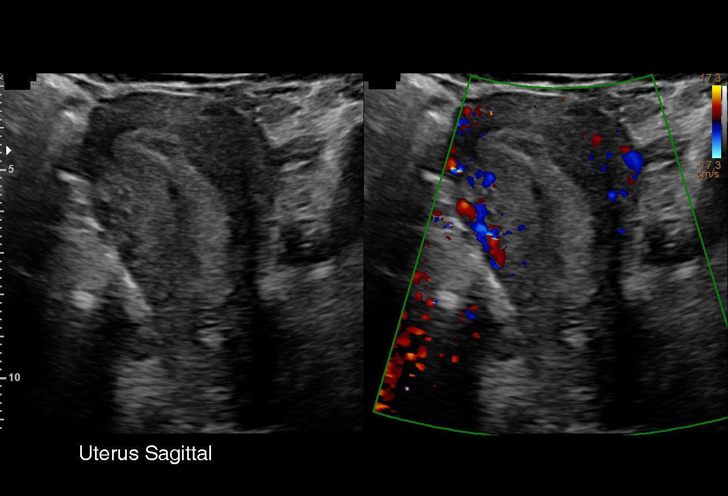
[im 27/53]
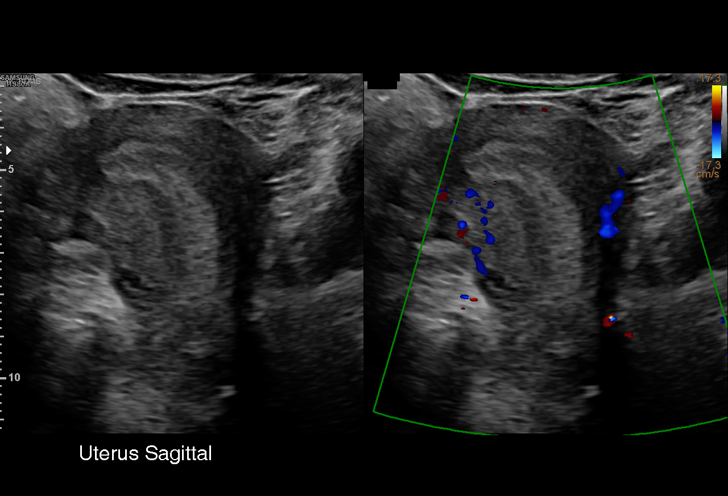
[im 29/53]
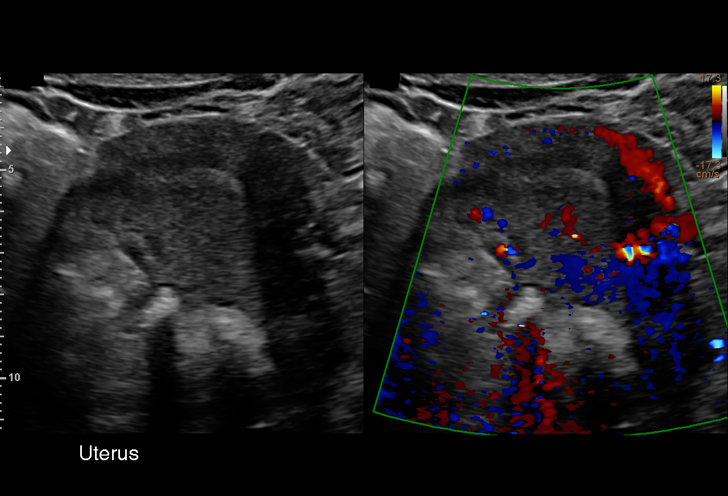
[im 33/53]
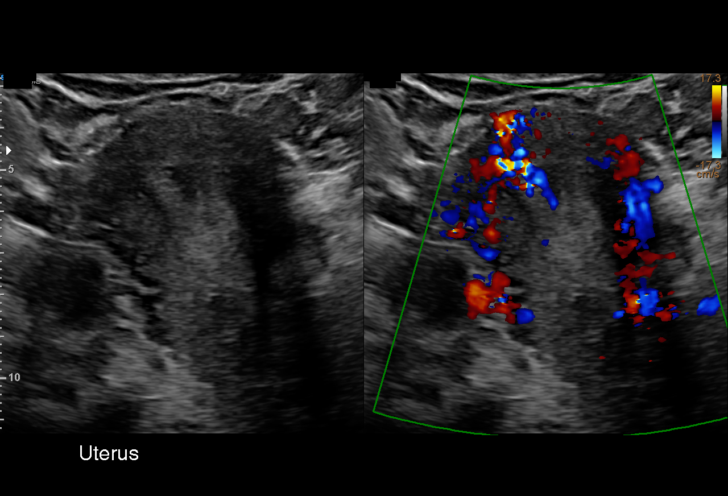
[im 37/53]
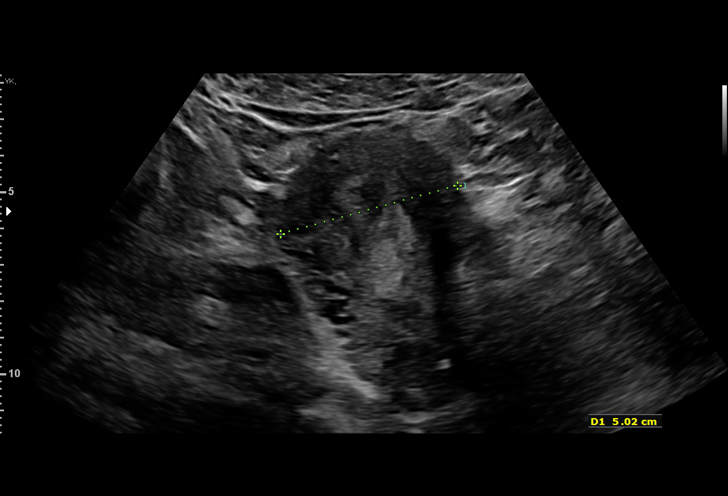
[im 41/53]
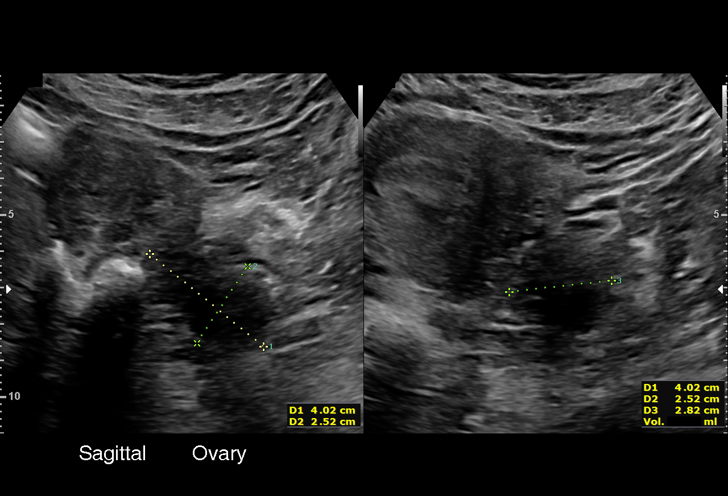
[im 45/53]
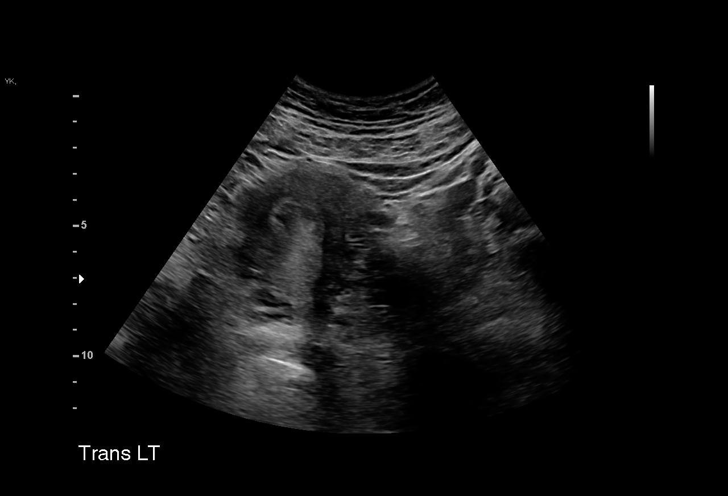
[im 49/53]
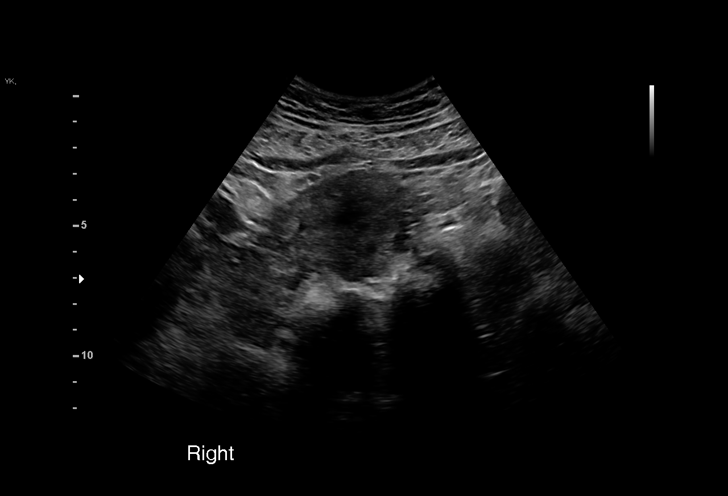
[im 53/53]
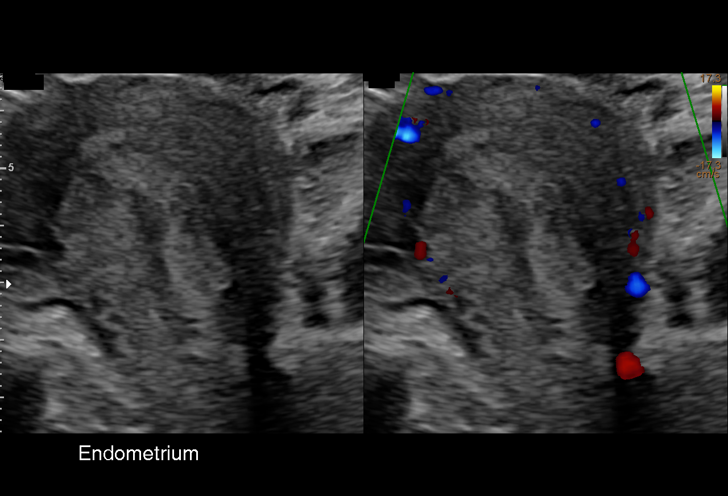

[15 of 28 positions shown; findings below may reference images not displayed]

FINDINGS: Intrauterine gestational sac: None

Yolk sac:  Not Visualized.

Embryo:  Not Visualized.

Subchorionic hemorrhage:  None visualized.

Maternal uterus/adnexae: The ovaries bilaterally are unremarkable.
IMPRESSION: No evidence of intrauterine pregnancy. The IUP identified on
04/19/2019 is no longer visualized, compatible with 1st trimester
pregnancy failure.

## 2022-01-20 ENCOUNTER — Ambulatory Visit: Payer: PRIVATE HEALTH INSURANCE | Admitting: Obstetrics and Gynecology

## 2022-03-03 ENCOUNTER — Encounter: Payer: Self-pay | Admitting: *Deleted

## 2022-04-04 ENCOUNTER — Other Ambulatory Visit: Payer: Self-pay | Admitting: Family Medicine

## 2022-04-09 ENCOUNTER — Telehealth: Payer: Self-pay | Admitting: Family Medicine

## 2022-04-09 MED ORDER — ESCITALOPRAM OXALATE 20 MG PO TABS: 20.0000 mg | ORAL_TABLET | Freq: Every day | ORAL | 3 refills | Status: DC

## 2022-04-09 NOTE — Telephone Encounter (Signed)
  Encourage patient to contact the pharmacy for refills or they can request refills through [facility]  LAST APPOINTMENT DATE:  Please schedule appointment if longer than 1 year  NEXT APPOINTMENT DATE:  MEDICATION:escitalopram (LEXAPRO) 20 MG tablet   Is the patient out of medication?   PHARMACY:[id number] - [place], [place] - [address]   Let patient know to contact pharmacy at the end of the day to make sure medication is ready.  Please notify patient to allow 48-72 hours to process  CLINICAL FILLS OUT ALL BELOW:   LAST REFILL:  QTY:  REFILL DATE:    OTHER COMMENTS:    Okay for refill?  Please advise

## 2022-04-09 NOTE — Telephone Encounter (Addendum)
GYN filled med last, CPE was on 10/15/21, please advise

## 2022-04-09 NOTE — Addendum Note (Signed)
Addended by: Roxy Manns A on: 04/09/2022 11:10 AM   Modules accepted: Orders

## 2022-04-20 IMAGING — US US MFM OB COMP +14 WKS
1 series · 13 of 28 positions shown · non-contrast
Comparison: none

[Series 1: us mfm ob comp +14 wks · 95 acquisitions, 13 frames shown]
[im 4/95]
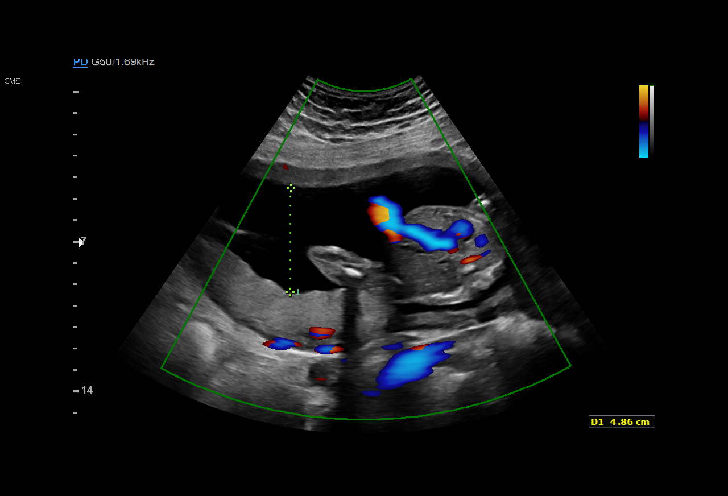
[im 11/95]
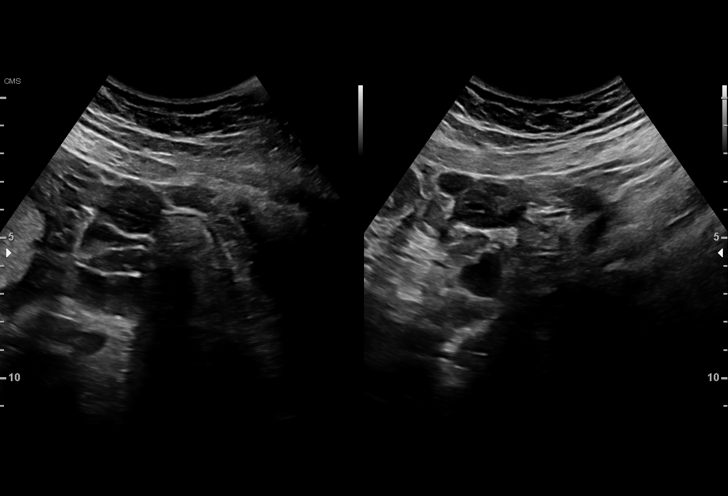
[im 18/95]
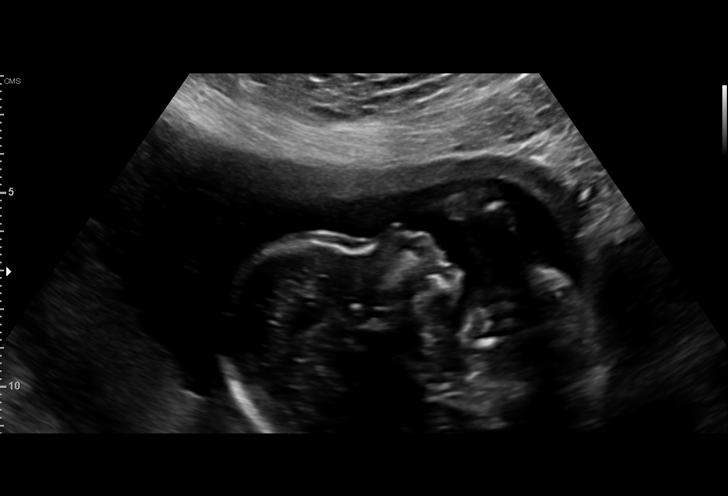
[im 25/95]
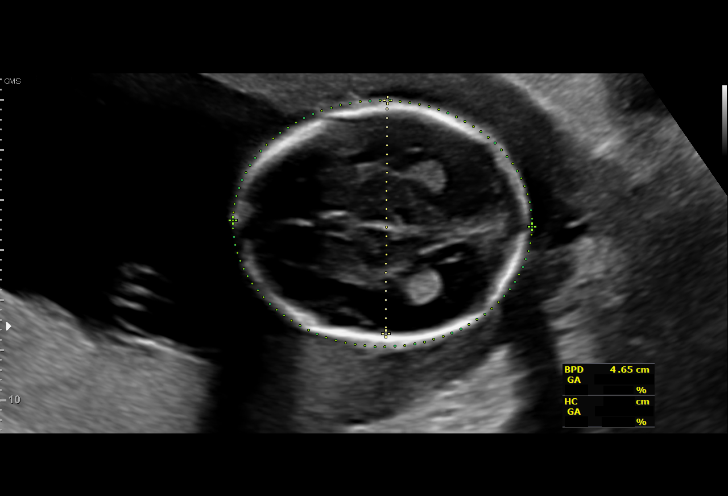
[im 32/95]
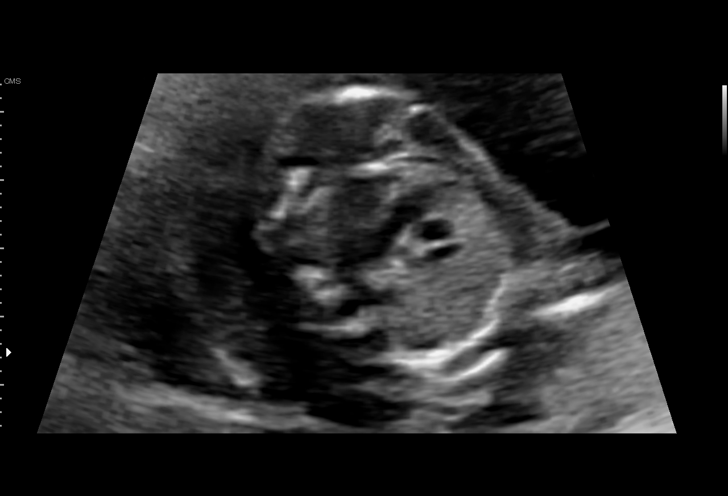
[im 39/95]
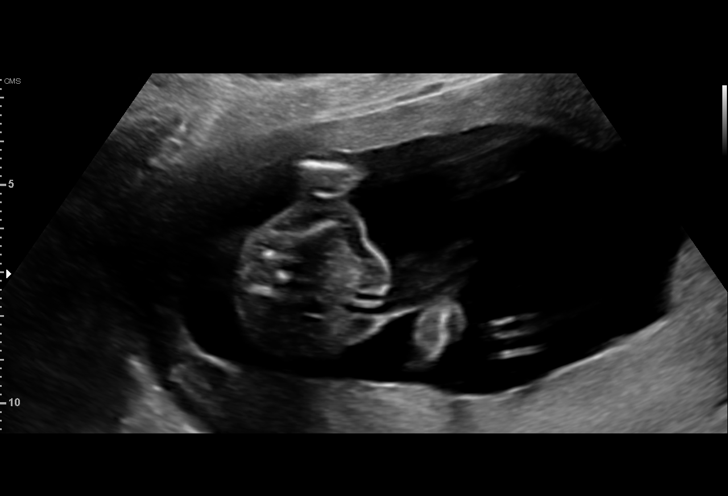
[im 49/95]
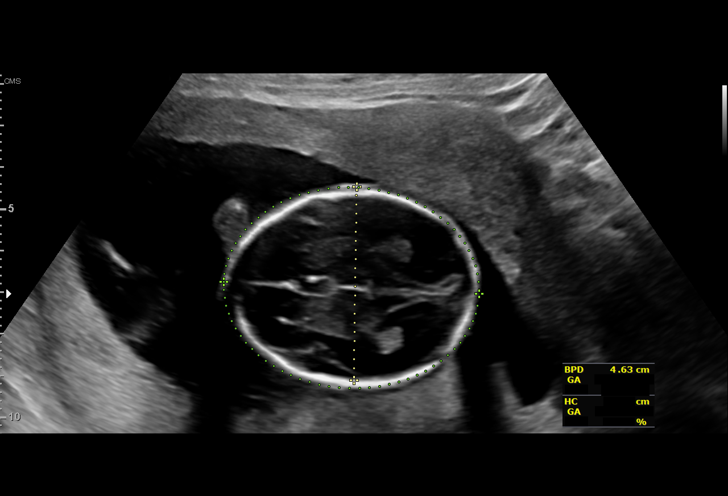
[im 56/95]
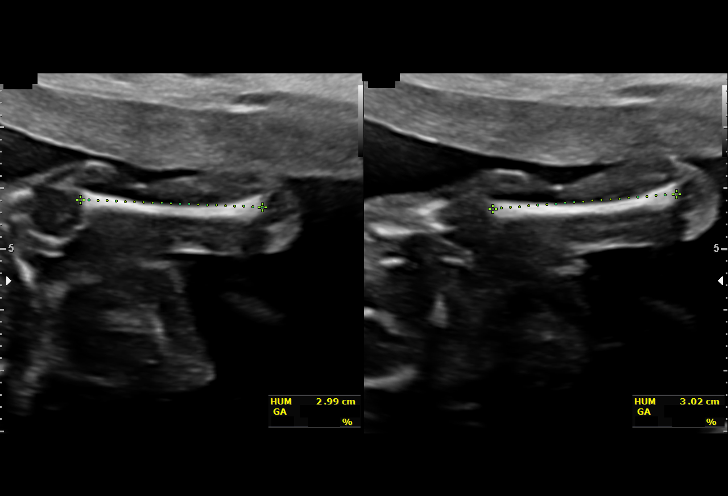
[im 63/95]
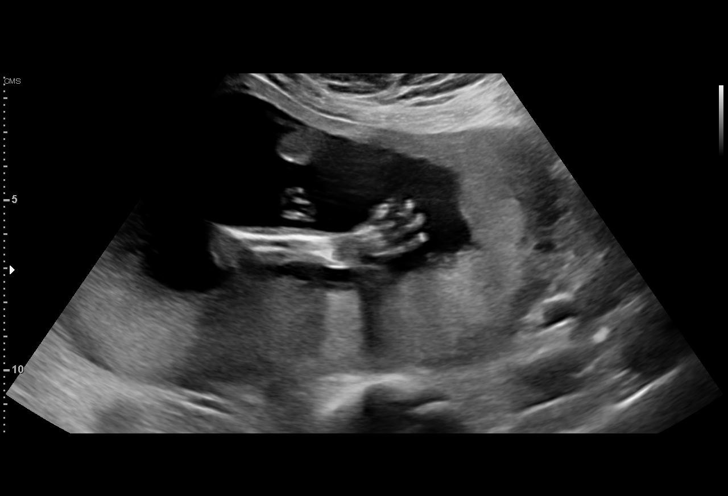
[im 70/95]
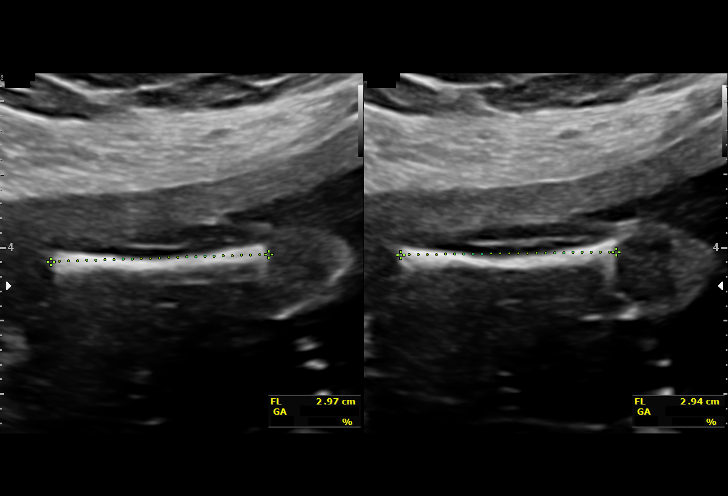
[im 77/95]
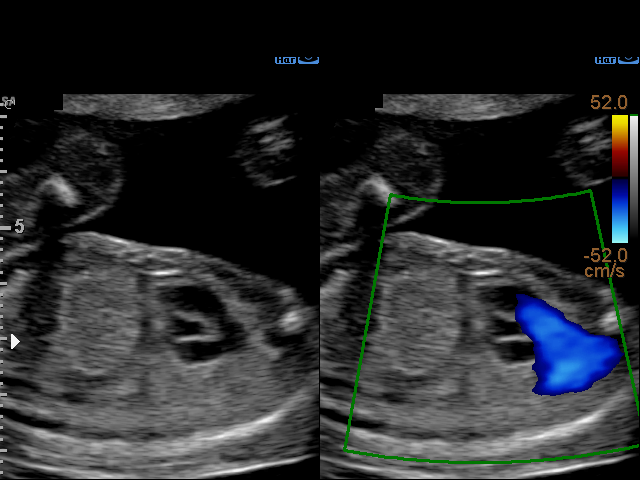
[im 84/95]
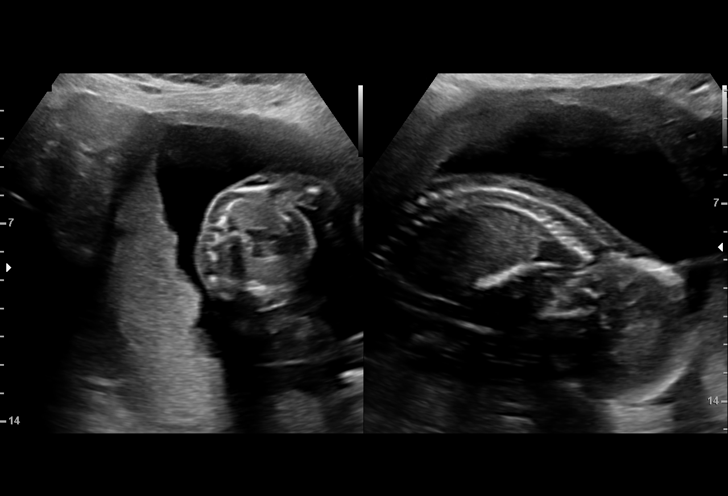
[im 91/95]
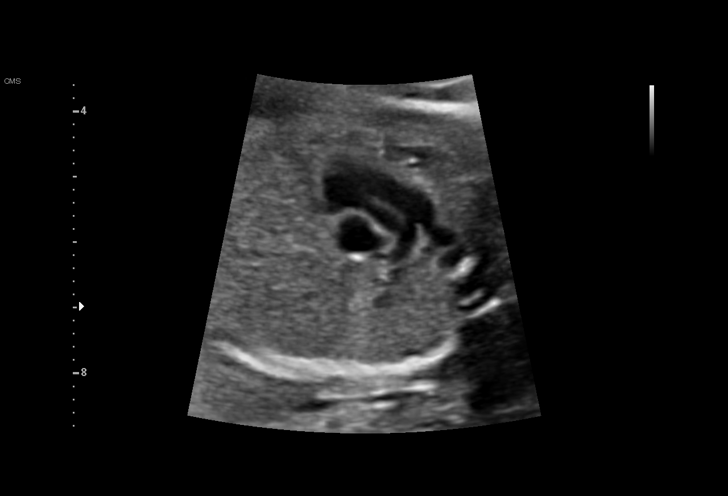

[13 of 28 positions shown; findings below may reference images not displayed]

1  US MFM OB COMP + 14 WK                76805.01    MAIBRIT CHUHAIBER

Indications

 19 weeks gestation of pregnancy
 Antenatal screening for malformations
Fetal Evaluation

 Num Of Fetuses:         1
 Fetal Heart Rate(bpm):  138
 Cardiac Activity:       Observed
 Presentation:           Transverse, head to maternal left
 Placenta:               Posterior
 P. Cord Insertion:      Visualized

 Amniotic Fluid
 AFI FV:      Within normal limits

                             Largest Pocket(cm)

Biometry

 BPD:      46.4  mm     G. Age:  20w 0d         88  %    CI:        73.18   %    70 - 86
                                                         FL/HC:      17.1   %    16.1 -
 HC:      172.4  mm     G. Age:  19w 6d         80  %    HC/AC:      1.15        1.09 -
 AC:      150.5  mm     G. Age:  20w 2d         85  %    FL/BPD:     63.6   %
 FL:       29.5  mm     G. Age:  19w 1d         47  %    FL/AC:      19.6   %    20 - 24
 HUM:      30.3  mm     G. Age:  20w 0d         76  %
 CER:      20.2  mm     G. Age:  19w 1d         55  %
 NFT:       4.2  mm
 CM:        3.8  mm

 Est. FW:     311  gm    0 lb 11 oz      87  %
OB History
 Gravidity:    4         Term:   1        Prem:   0        SAB:   2
 TOP:          0       Ectopic:  0        Living: 1
Gestational Age

 LMP:           28w 0d        Date:  03/01/19                 EDD:   12/06/19
 U/S Today:     19w 6d                                        EDD:   02/01/20
 Best:          19w 0d     Det. By:  Early Ultrasound         EDD:   02/07/20
                                     (06/23/19)
Anatomy

 Cranium:               Appears normal         Aortic Arch:            Appears normal
 Cavum:                 Appears normal         Ductal Arch:            Appears normal
 Ventricles:            Appears normal         Diaphragm:              Appears normal
 Choroid Plexus:        Appears normal         Stomach:                Appears normal, left
                                                                       sided
 Cerebellum:            Appears normal         Abdomen:                Appears normal
 Posterior Fossa:       Appears normal         Abdominal Wall:         Appears nml (cord
                                                                       insert, abd wall)
 Nuchal Fold:           Appears normal         Cord Vessels:           Appears normal (3
                                                                       vessel cord)
 Face:                  Appears normal         Kidneys:                Appear normal
                        (orbits and profile)
 Lips:                  Appears normal         Bladder:                Appears normal
 Thoracic:              Appears normal         Spine:                  Appears normal
 Heart:                 Appears normal         Upper Extremities:      Appears normal
                        (4CH, axis, and
                        situs)
 RVOT:                  Appears normal         Lower Extremities:      Appears normal
 LVOT:                  Appears normal

 Other:  Heels/feet and open hands/5th digits visualized. Nasal bone
         visualized.
Cervix Uterus Adnexa

 Cervix
 Length:              4  cm.
 Normal appearance by transabdominal scan.

 Uterus
 No abnormality visualized.

 Right Ovary
 Within normal limits.

 Left Ovary
 Within normal limits.

 Cul De Sac
 No free fluid seen.

 Adnexa
 No abnormality visualized.
Impression

 We performed fetal anatomy scan. No makers of
 aneuploidies or fetal structural defects are seen. Fetal
 biometry is consistent with her previously-established dates.
 Amniotic fluid is normal and good fetal activity is seen.
 Patient understands the limitations of ultrasound in detecting
 fetal anomalies.
 Patient had opted not to screen for fetal aneuploidies.
 Obstetric history: Term vaginal delivery.
Recommendations

 Follow-up scans as clinically indicated.
                 Mariglesa, Gjermani

## 2022-08-17 ENCOUNTER — Other Ambulatory Visit: Payer: Self-pay | Admitting: Primary Care

## 2022-08-17 ENCOUNTER — Telehealth: Payer: Self-pay | Admitting: Primary Care

## 2022-08-17 DIAGNOSIS — J301 Allergic rhinitis due to pollen: Secondary | ICD-10-CM

## 2022-08-17 DIAGNOSIS — H6501 Acute serous otitis media, right ear: Secondary | ICD-10-CM

## 2022-08-17 MED ORDER — PREDNISONE 20 MG PO TABS: ORAL_TABLET | ORAL | 0 refills | Status: DC

## 2022-08-17 MED ORDER — AMOXICILLIN-POT CLAVULANATE 875-125 MG PO TABS: 1.0000 | ORAL_TABLET | Freq: Two times a day (BID) | ORAL | 0 refills | Status: DC

## 2022-08-17 NOTE — Telephone Encounter (Signed)
Spoke with patient via phone, bilateral ear fullness with retraction without infection, no improvement with Flonase and Zyrtec.   Start prednisone 20 mg tablets. Take 2 tablets by mouth once daily in the morning for 5 days.  She will update if no improvement. Consider ENT referral if warranted.

## 2022-09-03 ENCOUNTER — Encounter: Payer: Self-pay | Admitting: Family Medicine

## 2022-09-03 ENCOUNTER — Ambulatory Visit (INDEPENDENT_AMBULATORY_CARE_PROVIDER_SITE_OTHER): Payer: 59 | Admitting: Family Medicine

## 2022-09-03 VITALS — BP 100/70 | HR 82 | Temp 97.4°F | Ht 63.0 in | Wt 164.5 lb

## 2022-09-03 DIAGNOSIS — R52 Pain, unspecified: Secondary | ICD-10-CM

## 2022-09-03 DIAGNOSIS — J029 Acute pharyngitis, unspecified: Secondary | ICD-10-CM | POA: Diagnosis not present

## 2022-09-03 LAB — POC COVID19 BINAXNOW: SARS Coronavirus 2 Ag: NEGATIVE

## 2022-09-03 LAB — POCT RAPID STREP A (OFFICE): Rapid Strep A Screen: NEGATIVE

## 2022-09-03 NOTE — Progress Notes (Unsigned)
    Claudia Dowson T. Amelya Mabry, MD, CAQ Sports Medicine [facility] [address] [place], [address]  Phone: [phone]  FAX: [phone]  Claudia Rogers - 37 y.o. female  MRN [id number]  Date of Birth: 01/08/1986  Date: 09/03/2022  PCP: Judy Pimple, MD  Referral: Judy Pimple, MD  Chief Complaint  Patient presents with   Sore Throat    Son Tested Positive for Strep on Monday   Generalized Body Aches   Subjective:   Claudia Rogers is a 37 y.o. very pleasant female patient with Body mass index is 29.14 kg/m. who presents with the following:  Oldest child has strep, and the smaller child also feels bad.  Today, has a really sore throat and got really achy this morning.   Some drainage.   No fever No GI symptoms  Review of Systems is noted in the HPI, as appropriate  Objective:   BP 100/70   Pulse 82   Temp (!) 97.4 F (36.3 C) (Temporal)   Ht  (1.6 m)   Wt 164 lb 8 oz (74.6 kg)   LMP 08/30/2022   SpO2 98%   BMI 29.14 kg/m   GEN: No acute distress; alert,appropriate. PULM: Breathing comfortably in no respiratory distress PSYCH: Normally interactive.   Laboratory and Imaging Data:  Assessment and Plan:   ***

## 2022-09-05 LAB — CULTURE, GROUP A STREP
MICRO NUMBER:: 14868060
SPECIMEN QUALITY:: ADEQUATE

## 2023-03-08 ENCOUNTER — Telehealth: Payer: Self-pay | Admitting: Family Medicine

## 2023-03-08 DIAGNOSIS — Z0000 Encounter for general adult medical examination without abnormal findings: Secondary | ICD-10-CM

## 2023-03-08 NOTE — Telephone Encounter (Signed)
-----   Message from Alvina Chou sent at 02/25/2023  9:21 AM EDT ----- Regarding: Lab orders for [name], 10.31.24 Patient is scheduled for CPX labs, please order future labs, Thanks , Camelia Eng

## 2023-03-12 ENCOUNTER — Other Ambulatory Visit (INDEPENDENT_AMBULATORY_CARE_PROVIDER_SITE_OTHER): Payer: 59

## 2023-03-12 DIAGNOSIS — Z0000 Encounter for general adult medical examination without abnormal findings: Secondary | ICD-10-CM

## 2023-03-12 LAB — COMPREHENSIVE METABOLIC PANEL
ALT: 16 U/L (ref 0–35)
AST: 20 U/L (ref 0–37)
Albumin: 4.2 g/dL (ref 3.5–5.2)
Alkaline Phosphatase: 39 U/L (ref 39–117)
BUN: 19 mg/dL (ref 6–23)
CO2: 26 meq/L (ref 19–32)
Calcium: 9.3 mg/dL (ref 8.4–10.5)
Chloride: 104 meq/L (ref 96–112)
Creatinine, Ser: 0.68 mg/dL (ref 0.40–1.20)
GFR: 112.07 mL/min (ref >60.00)
Glucose, Bld: 88 mg/dL (ref 70–99)
Potassium: 4.2 meq/L (ref 3.5–5.1)
Sodium: 138 meq/L (ref 135–145)
Total Bilirubin: 0.5 mg/dL (ref 0.2–1.2)
Total Protein: 6.8 g/dL (ref 6.0–8.3)

## 2023-03-12 LAB — TSH: TSH: 1.43 u[IU]/mL (ref 0.35–5.50)

## 2023-03-12 LAB — LIPID PANEL
Cholesterol: 202 mg/dL — ABNORMAL HIGH (ref 0–200)
HDL: 50.2 mg/dL (ref >39.00)
LDL Cholesterol: 138 mg/dL — ABNORMAL HIGH (ref 0–99)
NonHDL: 152.03
Total CHOL/HDL Ratio: 4
Triglycerides: 69 mg/dL (ref 0.0–149.0)
VLDL: 13.8 mg/dL (ref 0.0–40.0)

## 2023-03-12 LAB — CBC WITH DIFFERENTIAL/PLATELET
Basophils Absolute: 0 10*3/uL (ref 0.0–0.1)
Basophils Relative: 0.6 % (ref 0.0–3.0)
Eosinophils Absolute: 0.3 10*3/uL (ref 0.0–0.7)
Eosinophils Relative: 3.9 % (ref 0.0–5.0)
HCT: 36.3 % (ref 36.0–46.0)
Hemoglobin: 11.9 g/dL — ABNORMAL LOW (ref 12.0–15.0)
Lymphocytes Relative: 26.1 % (ref 12.0–46.0)
Lymphs Abs: 1.9 10*3/uL (ref 0.7–4.0)
MCHC: 32.8 g/dL (ref 30.0–36.0)
MCV: 93.3 fL (ref 78.0–100.0)
Monocytes Absolute: 0.6 10*3/uL (ref 0.1–1.0)
Monocytes Relative: 8.8 % (ref 3.0–12.0)
Neutro Abs: 4.3 10*3/uL (ref 1.4–7.7)
Neutrophils Relative %: 60.6 % (ref 43.0–77.0)
Platelets: 350 10*3/uL (ref 150.0–400.0)
RBC: 3.89 Mil/uL (ref 3.87–5.11)
RDW: 12.8 % (ref 11.5–15.5)
WBC: 7.1 10*3/uL (ref 4.0–10.5)

## 2023-03-13 ENCOUNTER — Other Ambulatory Visit: Payer: PRIVATE HEALTH INSURANCE

## 2023-03-20 ENCOUNTER — Encounter: Payer: Self-pay | Admitting: Family Medicine

## 2023-03-20 ENCOUNTER — Ambulatory Visit (INDEPENDENT_AMBULATORY_CARE_PROVIDER_SITE_OTHER): Payer: 59 | Admitting: Family Medicine

## 2023-03-20 VITALS — BP 110/68 | HR 68 | Temp 98.2°F | Ht 63.0 in | Wt 161.1 lb

## 2023-03-20 DIAGNOSIS — D649 Anemia, unspecified: Secondary | ICD-10-CM | POA: Diagnosis not present

## 2023-03-20 DIAGNOSIS — Z0000 Encounter for general adult medical examination without abnormal findings: Secondary | ICD-10-CM | POA: Diagnosis not present

## 2023-03-20 DIAGNOSIS — E7800 Pure hypercholesterolemia, unspecified: Secondary | ICD-10-CM | POA: Diagnosis not present

## 2023-03-20 DIAGNOSIS — F418 Other specified anxiety disorders: Secondary | ICD-10-CM | POA: Diagnosis not present

## 2023-03-20 DIAGNOSIS — E785 Hyperlipidemia, unspecified: Secondary | ICD-10-CM | POA: Insufficient documentation

## 2023-03-20 NOTE — Assessment & Plan Note (Signed)
Hb 11.9  Likely from menses  Taking ferrous sulfate 325 mg and eating balanced diet  Will watch

## 2023-03-20 NOTE — Assessment & Plan Note (Signed)
Doing well with lexapro 20 mg daily  Would like to get off of it when kids are older   Good self care

## 2023-03-20 NOTE — Assessment & Plan Note (Signed)
Disc goals for lipids and reasons to control them Rev last labs with pt Rev low sat fat diet in detail LDL 138 Watching this  Did eat badly late last month/doing better now Has fam history   Will continue to follow Handout given

## 2023-03-20 NOTE — Assessment & Plan Note (Signed)
Reviewed health habits including diet and exercise and skin cancer prevention Reviewed appropriate screening tests for age  Also reviewed health mt list, fam hx and immunization status , as well as social and family history   See HPI Labs reviewed and ordered Declines flu shot  Planning follow up with gyn for exam and pap - last pap 08/2017 Discussed fall prevention, supplements and exercise for bone density  Plans to get back on vit D PHQ 0 - on lexapro Watching cholesterol

## 2023-03-20 NOTE — Progress Notes (Signed)
Subjective:    Patient ID: Claudia Rogers, female    DOB: 1985-08-18, 37 y.o.   MRN: [id number]  HPI  Here for health maintenance exam and to review chronic medical problems   Wt Readings from Last 3 Encounters:  03/20/23 161 lb 2 oz (73.1 kg)  09/03/22 164 lb 8 oz (74.6 kg)  10/15/21 163 lb (73.9 kg)   28.54 kg/m  Vitals:   03/20/23 0821  BP: 110/68  Pulse: 68  Temp: 98.2 F (36.8 C)  SpO2: 98%    Immunization History  Administered Date(s) Administered   Influenza,inj,Quad PF,6+ Mos 01/23/2016   Tdap 01/10/2016    There are no preventive care reminders to display for this patient.  Doing ok  Is a tired mom overall  Feels inflamed  Tried the whole 30 diet to remove sugar from diet and eating whole foods   Flu shot - declines    Mammogram- not started yet  Self breast exam No breast cancer in family   Gyn health 08/2017 normal with neg HPV (gyn) with 5 y recall Plans to schedule with gyn at [facility]  Husband had vasectomy    Colon cancer screening  1/2 sister died of colon cancer -in 77s    Bone health  Falls-none Fractures-none  Supplements - no ca or D  Takes mvi  Exercise  Lots - runs and lifts weights     Mood    03/20/2023    8:25 AM 09/03/2022    4:18 PM 10/15/2021    8:05 AM 11/30/2019    2:18 PM 12/16/2016   10:29 AM  Depression screen PHQ 2/9  Decreased Interest 1 0 0 0 0  Down, Depressed, Hopeless 1 0 0 0 1  PHQ - 2 Score 2 0 0 0 1  Altered sleeping 0 0   1  Tired, decreased energy 1 0   1  Change in appetite 1 0   0  Feeling bad or failure about yourself  1 0   0  Trouble concentrating 0 0   1  Moving slowly or fidgety/restless 0 0   0  Suicidal thoughts 0 0   0  PHQ-9 Score 5 0   4  Difficult doing work/chores Not difficult at all Not difficult at all     Depression with anxiety  Lexapro 20 mg daily  Doing pretty good on that  Wants to continue it   Cholesterol Lab Results  Component Value Date   CHOL 202 (H) 03/12/2023    CHOL 178 10/15/2021   CHOL 177 09/25/2016   Lab Results  Component Value Date   HDL 50.20 03/12/2023   HDL 46.20 10/15/2021   HDL 57 09/25/2016   Lab Results  Component Value Date   LDLCALC 138 (H) 03/12/2023   LDLCALC 108 (H) 10/15/2021   LDLCALC 95 09/25/2016   Lab Results  Component Value Date   TRIG 69.0 03/12/2023   TRIG 117.0 10/15/2021   TRIG 124 09/25/2016   Lab Results  Component Value Date   CHOLHDL 4 03/12/2023   CHOLHDL 4 10/15/2021   CHOLHDL 3.1 09/25/2016   No results found for: "LDLDIRECT" LDL is up  This does run in family -mom and sister     Anemia Lab Results  Component Value Date   WBC 7.1 03/12/2023   HGB 11.9 (L) 03/12/2023   HCT 36.3 03/12/2023   MCV 93.3 03/12/2023   PLT 350.0 03/12/2023   Started iron recently  Does  not donate blood  Does have regular periods    Lab Results  Component Value Date   NA 138 03/12/2023   K 4.2 03/12/2023   CO2 26 03/12/2023   GLUCOSE 88 03/12/2023   BUN 19 03/12/2023   CREATININE 0.68 03/12/2023   CALCIUM 9.3 03/12/2023   GFR 112.07 03/12/2023   GFRNONAA >60 01/31/2020   Lab Results  Component Value Date   ALT 16 03/12/2023   AST 20 03/12/2023   ALKPHOS 39 03/12/2023   BILITOT 0.5 03/12/2023    Lab Results  Component Value Date   TSH 1.43 03/12/2023       Patient Active Problem List   Diagnosis Date Noted   Hyperlipidemia 03/20/2023   Mild anemia 03/20/2023   Depression with anxiety 09/11/2020   History of gestational diabetes 11/10/2019   Seasonal allergic rhinitis 11/14/2014   Routine general medical examination at a health care facility 05/03/2014   History of meningitis 05/12/2008   Past Medical History:  Diagnosis Date   Anxiety    GDM (gestational diabetes mellitus), class A1 11/10/2019   03/2020: negative postpartum 2h GTT   H/O viral meningitis    History of chicken pox    Past Surgical History:  Procedure Laterality Date   EYE MUSCLE SURGERY Bilateral 1996   EYE  MUSCLE SURGERY Bilateral 2000   Odontoma Removal/implant  2003   WISDOM TOOTH EXTRACTION     Social History   Tobacco Use   Smoking status: Never   Smokeless tobacco: Never  Vaping Use   Vaping status: Never Used  Substance Use Topics   Alcohol use: Not Currently    Alcohol/week: 0.0 standard drinks of alcohol    Comment: Social   Drug use: No   Family History  Problem Relation Age of Onset   Arthritis Mother    High blood pressure Mother    Diabetes Mother        Type II   Lung cancer Father    Hypertrophic cardiomyopathy Sister    High blood pressure Sister    Mental illness Sister    Diabetes Brother        Type II-BKA   Arthritis Maternal Grandmother    Stroke Maternal Grandmother    High blood pressure Maternal Grandmother    Diabetes Maternal Grandmother        Type II   Alcoholism Maternal Aunt    Kidney disease Maternal Aunt    Alcoholism Maternal Uncle    Lung cancer Paternal Aunt    Alcoholism Cousin    Heart disease Neg Hx    Cancer Neg Hx    No Known Allergies Current Outpatient Medications on File Prior to Visit  Medication Sig Dispense Refill   cetirizine (ZYRTEC) 10 MG tablet Take 10 mg by mouth daily.     escitalopram (LEXAPRO) 20 MG tablet Take 1 tablet (20 mg total) by mouth daily. 90 tablet 3   ferrous sulfate 325 (65 FE) MG tablet Take 325 mg by mouth daily.     fluticasone (FLONASE) 50 MCG/ACT nasal spray Place 2 sprays into both nostrils daily.     No current facility-administered medications on file prior to visit.    Review of Systems  Constitutional:  Positive for fatigue. Negative for activity change, appetite change, fever and unexpected weight change.  HENT:  Negative for congestion, ear pain, rhinorrhea, sinus pressure and sore throat.   Eyes:  Negative for pain, redness and visual disturbance.  Respiratory:  Negative for  cough, shortness of breath and wheezing.   Cardiovascular:  Negative for chest pain and palpitations.   Gastrointestinal:  Negative for abdominal pain, blood in stool, constipation and diarrhea.  Endocrine: Negative for polydipsia and polyuria.  Genitourinary:  Negative for dysuria, frequency and urgency.  Musculoskeletal:  Negative for arthralgias, back pain and myalgias.  Skin:  Negative for pallor and rash.  Allergic/Immunologic: Negative for environmental allergies.  Neurological:  Negative for dizziness, syncope and headaches.  Hematological:  Negative for adenopathy. Does not bruise/bleed easily.  Psychiatric/Behavioral:  Negative for decreased concentration and dysphoric mood. The patient is not nervous/anxious.        Objective:   Physical Exam Constitutional:      General: She is not in acute distress.    Appearance: Normal appearance. She is well-developed and normal weight. She is not ill-appearing or diaphoretic.  HENT:     Head: Normocephalic and atraumatic.     Right Ear: Tympanic membrane, ear canal and external ear normal.     Left Ear: Tympanic membrane, ear canal and external ear normal.     Nose: Nose normal. No congestion.     Mouth/Throat:     Mouth: Mucous membranes are moist.     Pharynx: Oropharynx is clear. No posterior oropharyngeal erythema.  Eyes:     General: No scleral icterus.    Extraocular Movements: Extraocular movements intact.     Conjunctiva/sclera: Conjunctivae normal.     Pupils: Pupils are equal, round, and reactive to light.  Neck:     Thyroid: No thyromegaly.     Vascular: No carotid bruit or JVD.  Cardiovascular:     Rate and Rhythm: Normal rate and regular rhythm.     Pulses: Normal pulses.     Heart sounds: Normal heart sounds.     No gallop.  Pulmonary:     Effort: Pulmonary effort is normal. No respiratory distress.     Breath sounds: Normal breath sounds. No wheezing.     Comments: Good air exch Chest:     Chest wall: No tenderness.  Abdominal:     General: Bowel sounds are normal. There is no distension or abdominal bruit.      Palpations: Abdomen is soft. There is no mass.     Tenderness: There is no abdominal tenderness.     Hernia: No hernia is present.  Genitourinary:    Comments: Breast exam: No mass, nodules, thickening, tenderness, bulging, retraction, inflamation, nipple discharge or skin changes noted.  No axillary or clavicular LA.     Musculoskeletal:        General: No tenderness. Normal range of motion.     Cervical back: Normal range of motion and neck supple. No rigidity. No muscular tenderness.     Right lower leg: No edema.     Left lower leg: No edema.     Comments: No kyphosis   Lymphadenopathy:     Cervical: No cervical adenopathy.  Skin:    General: Skin is warm and dry.     Coloration: Skin is not pale.     Findings: No erythema or rash.     Comments: Solar lentigines diffusely   Neurological:     Mental Status: She is alert. Mental status is at baseline.     Cranial Nerves: No cranial nerve deficit.     Motor: No abnormal muscle tone.     Coordination: Coordination normal.     Gait: Gait normal.     Deep Tendon Reflexes:  Reflexes are normal and symmetric. Reflexes normal.  Psychiatric:        Mood and Affect: Mood normal.        Cognition and Memory: Cognition and memory normal.           Assessment & Plan:   Problem List Items Addressed This Visit       Other   Depression with anxiety    Doing well with lexapro 20 mg daily  Would like to get off of it when kids are older   Good self care        Hyperlipidemia    Disc goals for lipids and reasons to control them Rev last labs with pt Rev low sat fat diet in detail LDL 138 Watching this  Did eat badly late last month/doing better now Has fam history   Will continue to follow Handout given      Mild anemia    Hb 11.9  Likely from menses  Taking ferrous sulfate 325 mg and eating balanced diet  Will watch      Relevant Medications   ferrous sulfate 325 (65 FE) MG tablet   Routine general medical  examination at a health care facility - Primary    Reviewed health habits including diet and exercise and skin cancer prevention Reviewed appropriate screening tests for age  Also reviewed health mt list, fam hx and immunization status , as well as social and family history   See HPI Labs reviewed and ordered Declines flu shot  Planning follow up with gyn for exam and pap - last pap 08/2017 Discussed fall prevention, supplements and exercise for bone density  Plans to get back on vit D PHQ 0 - on lexapro Watching cholesterol

## 2023-03-20 NOTE — Patient Instructions (Addendum)
Try to get most of your carbohydrates from produce (with the exception of [place] potatoes) and whole grains Eat less bread/pasta/rice/snack foods/cereals/sweets and other items from the middle of the grocery store (processed carbs)  Get your annual gyn exam schedule   Try and get vitamin D3 2000 international units daily   Keep up the great job with exercise   Avoid red meat/ fried foods/ egg yolks/ fatty breakfast meats/ butter, cheese and high fat dairy/ and shellfish

## 2023-04-10 ENCOUNTER — Other Ambulatory Visit: Payer: Self-pay | Admitting: Family Medicine

## 2024-01-18 ENCOUNTER — Encounter: Payer: Self-pay | Admitting: Dermatology

## 2024-01-18 ENCOUNTER — Ambulatory Visit (INDEPENDENT_AMBULATORY_CARE_PROVIDER_SITE_OTHER): Admitting: Dermatology

## 2024-01-18 DIAGNOSIS — L578 Other skin changes due to chronic exposure to nonionizing radiation: Secondary | ICD-10-CM

## 2024-01-18 DIAGNOSIS — L700 Acne vulgaris: Secondary | ICD-10-CM

## 2024-01-18 DIAGNOSIS — W908XXA Exposure to other nonionizing radiation, initial encounter: Secondary | ICD-10-CM

## 2024-01-18 DIAGNOSIS — L814 Other melanin hyperpigmentation: Secondary | ICD-10-CM | POA: Diagnosis not present

## 2024-01-18 DIAGNOSIS — L821 Other seborrheic keratosis: Secondary | ICD-10-CM

## 2024-01-18 DIAGNOSIS — D1801 Hemangioma of skin and subcutaneous tissue: Secondary | ICD-10-CM

## 2024-01-18 DIAGNOSIS — D229 Melanocytic nevi, unspecified: Secondary | ICD-10-CM

## 2024-01-18 DIAGNOSIS — Z1283 Encounter for screening for malignant neoplasm of skin: Secondary | ICD-10-CM | POA: Diagnosis not present

## 2024-01-18 MED ORDER — TRETINOIN 0.025 % EX CREA: TOPICAL_CREAM | Freq: Every day | CUTANEOUS | 3 refills | Status: DC

## 2024-01-18 MED ORDER — TRETINOIN 0.025 % EX CREA: TOPICAL_CREAM | Freq: Every day | CUTANEOUS | 6 refills | Status: DC

## 2024-01-18 NOTE — Progress Notes (Signed)
   Follow-Up Visit   Subjective  Claudia Rogers is a 38 y.o. female who presents for the following: Skin Cancer Screening and Full Body Skin Exam  No personal hx of abnormal nevi or skin cancer. Mother has hx of numerous skin cancers. Mercadies has no areas of concern today.  Would like to discuss tretinoin  for aging spots.  The patient presents for Total-Body Skin Exam (TBSE) for skin cancer screening and mole check. The patient has spots, moles and lesions to be evaluated, some may be new or changing and the patient may have concern these could be cancer.    The following portions of the chart were reviewed this encounter and updated as appropriate: medications, allergies, medical history  Review of Systems:  No other skin or systemic complaints except as noted in HPI or Assessment and Plan.  Objective  Well appearing patient in no apparent distress; mood and affect are within normal limits.  A full examination was performed including scalp, head, eyes, ears, nose, lips, neck, chest, axillae, abdomen, back, buttocks, bilateral upper extremities, bilateral lower extremities, hands, feet, fingers, toes, fingernails, and toenails. All findings within normal limits unless otherwise noted below.   Relevant physical exam findings are noted in the Assessment and Plan.    Assessment & Plan   SKIN CANCER SCREENING PERFORMED TODAY.  ACTINIC DAMAGE - Chronic condition, secondary to cumulative UV/sun exposure - diffuse scaly erythematous macules with underlying dyspigmentation - Recommend daily broad spectrum sunscreen SPF 30+ to sun-exposed areas, reapply every 2 hours as needed.  - Staying in the shade or wearing long sleeves, sun glasses (UVA+UVB protection) and wide brim hats (4-inch brim around the entire circumference of the hat) are also recommended for sun protection.  - Call for new or changing lesions.  LENTIGINES, SEBORRHEIC KERATOSES, HEMANGIOMAS - Benign normal skin lesions -  Benign-appearing - Call for any changes  MELANOCYTIC NEVI - Tan-brown and/or pink-flesh-colored symmetric macules and papules - Benign appearing on exam today - Observation - Call clinic for new or changing moles - Recommend daily use of broad spectrum spf 30+ sunscreen to sun-exposed areas.   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 Apply nightly after two weeks if no irritation.       No follow-ups on file.  I, Gordan Beams, CMA, am acting as scribe for Boneta Sharps, MD.   Documentation: I have reviewed the above documentation for accuracy and completeness, and I agree with the above.  Boneta Sharps, MD

## 2024-03-07 ENCOUNTER — Encounter: Payer: Self-pay | Admitting: Dermatology

## 2024-03-09 MED ORDER — TRETINOIN 0.05 % EX CREA: TOPICAL_CREAM | Freq: Every day | CUTANEOUS | 11 refills | Status: AC

## 2024-03-22 ENCOUNTER — Telehealth: Payer: Self-pay | Admitting: Family Medicine

## 2024-03-22 DIAGNOSIS — Z0000 Encounter for general adult medical examination without abnormal findings: Secondary | ICD-10-CM

## 2024-03-22 DIAGNOSIS — D649 Anemia, unspecified: Secondary | ICD-10-CM

## 2024-03-22 DIAGNOSIS — E7800 Pure hypercholesterolemia, unspecified: Secondary | ICD-10-CM

## 2024-03-22 NOTE — Telephone Encounter (Signed)
-----   Message from Veva JINNY Ferrari sent at 03/22/2024 10:17 AM EST ----- Regarding: Lab orders for Wed, 11.12.25 Patient is scheduled for CPX labs, please order future labs, Thanks , Veva

## 2024-03-23 ENCOUNTER — Other Ambulatory Visit

## 2024-03-23 ENCOUNTER — Ambulatory Visit: Payer: Self-pay | Admitting: Family Medicine

## 2024-03-23 DIAGNOSIS — D649 Anemia, unspecified: Secondary | ICD-10-CM | POA: Diagnosis not present

## 2024-03-23 DIAGNOSIS — Z0000 Encounter for general adult medical examination without abnormal findings: Secondary | ICD-10-CM

## 2024-03-23 DIAGNOSIS — E7800 Pure hypercholesterolemia, unspecified: Secondary | ICD-10-CM

## 2024-03-23 LAB — COMPREHENSIVE METABOLIC PANEL WITH GFR
ALT: 16 U/L (ref 0–35)
AST: 18 U/L (ref 0–37)
Albumin: 4.5 g/dL (ref 3.5–5.2)
Alkaline Phosphatase: 33 U/L — ABNORMAL LOW (ref 39–117)
BUN: 15 mg/dL (ref 6–23)
CO2: 28 meq/L (ref 19–32)
Calcium: 9.1 mg/dL (ref 8.4–10.5)
Chloride: 102 meq/L (ref 96–112)
Creatinine, Ser: 0.72 mg/dL (ref 0.40–1.20)
GFR: 106.81 mL/min (ref >60.00)
Glucose, Bld: 86 mg/dL (ref 70–99)
Potassium: 4 meq/L (ref 3.5–5.1)
Sodium: 137 meq/L (ref 135–145)
Total Bilirubin: 0.5 mg/dL (ref 0.2–1.2)
Total Protein: 6.7 g/dL (ref 6.0–8.3)

## 2024-03-23 LAB — CBC WITH DIFFERENTIAL/PLATELET
Basophils Absolute: 0 K/uL (ref 0.0–0.1)
Basophils Relative: 0.2 % (ref 0.0–3.0)
Eosinophils Absolute: 0.1 K/uL (ref 0.0–0.7)
Eosinophils Relative: 1.5 % (ref 0.0–5.0)
HCT: 36.2 % (ref 36.0–46.0)
Hemoglobin: 12.8 g/dL (ref 12.0–15.0)
Lymphocytes Relative: 26.3 % (ref 12.0–46.0)
Lymphs Abs: 1.6 K/uL (ref 0.7–4.0)
MCHC: 35.3 g/dL (ref 30.0–36.0)
MCV: 91.8 fl (ref 78.0–100.0)
Monocytes Absolute: 0.5 K/uL (ref 0.1–1.0)
Monocytes Relative: 8.7 % (ref 3.0–12.0)
Neutro Abs: 3.9 K/uL (ref 1.4–7.7)
Neutrophils Relative %: 63.3 % (ref 43.0–77.0)
Platelets: 287 K/uL (ref 150.0–400.0)
RBC: 3.94 Mil/uL (ref 3.87–5.11)
RDW: 11.6 % (ref 11.5–15.5)
WBC: 6.1 K/uL (ref 4.0–10.5)

## 2024-03-23 LAB — LIPID PANEL
Cholesterol: 177 mg/dL (ref 0–200)
HDL: 48.4 mg/dL (ref >39.00)
LDL Cholesterol: 114 mg/dL — ABNORMAL HIGH (ref 0–99)
NonHDL: 128.8
Total CHOL/HDL Ratio: 4
Triglycerides: 75 mg/dL (ref 0.0–149.0)
VLDL: 15 mg/dL (ref 0.0–40.0)

## 2024-03-23 LAB — FERRITIN: Ferritin: 37.7 ng/mL (ref 10.0–291.0)

## 2024-03-23 LAB — TSH: TSH: 1.09 u[IU]/mL (ref 0.35–5.50)

## 2024-03-23 LAB — IRON: Iron: 101 ug/dL (ref 42–145)

## 2024-03-29 ENCOUNTER — Encounter: Payer: Self-pay | Admitting: Family Medicine

## 2024-03-29 ENCOUNTER — Ambulatory Visit (INDEPENDENT_AMBULATORY_CARE_PROVIDER_SITE_OTHER): Payer: Self-pay | Admitting: Family Medicine

## 2024-03-29 VITALS — BP 108/70 | HR 71 | Temp 98.0°F | Ht 63.0 in | Wt 166.4 lb

## 2024-03-29 DIAGNOSIS — F418 Other specified anxiety disorders: Secondary | ICD-10-CM

## 2024-03-29 DIAGNOSIS — Z0000 Encounter for general adult medical examination without abnormal findings: Secondary | ICD-10-CM | POA: Diagnosis not present

## 2024-03-29 DIAGNOSIS — D649 Anemia, unspecified: Secondary | ICD-10-CM

## 2024-03-29 DIAGNOSIS — E7800 Pure hypercholesterolemia, unspecified: Secondary | ICD-10-CM | POA: Diagnosis not present

## 2024-03-29 NOTE — Assessment & Plan Note (Signed)
 Reviewed health habits including diet and exercise and skin cancer prevention Reviewed appropriate screening tests for age  Also reviewed health mt list, fam hx and immunization status , as well as social and family history   See HPI Labs reviewed and ordered Health Maintenance  Topic Date Due   Hepatitis B Vaccine (1 of 3 - 19+ 3-dose series) Never done   HPV Vaccine (1 - Risk 3-dose SCDM series) Never done   Flu Shot  08/09/2024*   COVID-19 Vaccine (1) 04/04/2025*   DTaP/Tdap/Td vaccine (2 - Td or Tdap) 01/09/2026   Pap with HPV screening  08/29/2026   Hepatitis C Screening  Completed   HIV Screening  Completed   Pneumococcal Vaccine  Aged Out   Meningitis B Vaccine  Aged Out  *Topic was postponed. The date shown is not the original due date.    Sees gyn provider  No fam history of colon cancer will start screening at 46 Discussed fall prevention, supplements and exercise for bone density  Getting back to exercise after depressive episode PHQ 10 but thinks she is improving with medication and counseling  Labs reviewed

## 2024-03-29 NOTE — Assessment & Plan Note (Signed)
 Disc goals for lipids and reasons to control them Rev last labs with pt Rev low sat fat diet in detail Eating better  LDL down to 114  Commended

## 2024-03-29 NOTE — Assessment & Plan Note (Signed)
 Takes iron daily  Normal cbc and iron labs

## 2024-03-29 NOTE — Patient Instructions (Addendum)
 Stay on the 10 mg of lexapro   Let us  know if you need to go up to 20 mg    Gradually get back to exercise  Make sure to do strength training    Keep eating well

## 2024-03-29 NOTE — Progress Notes (Signed)
 Subjective:    Patient ID: Claudia Rogers, female    DOB: 13-Jan-1986, 38 y.o.   MRN: [id number]  HPI  Here for health maintenance exam and to review chronic medical problems   Wt Readings from Last 3 Encounters:  03/29/24 166 lb 6 oz (75.5 kg)  03/20/23 161 lb 2 oz (73.1 kg)  09/03/22 164 lb 8 oz (74.6 kg)   29.47 kg/m  Vitals:   03/29/24 0934  BP: 108/70  Pulse: 71  Temp: 98 F (36.7 C)  SpO2: 99%    Immunization History  Administered Date(s) Administered   Influenza,inj,Quad PF,6+ Mos 01/23/2016   Tdap 01/10/2016    Health Maintenance Due  Topic Date Due   Hepatitis B Vaccines 19-59 Average Risk (1 of 3 - 19+ 3-dose series) Never done   HPV VACCINES (1 - Risk 3-dose SCDM series) Never done   Has been feeling ok  Getting over a viral uri  Neg covid test    Self breast exam-no lumps   Gyn health Has gyn provider  Pap 08/2021  Periods are better (heavier when depressed)   Husb had vasectomy     Colon cancer screening -no family history   Bone health   Falls-none Fractures-none  Supplements  mvi and 1500 international units D3 in addition to that   Last vitamin D  Lab Results  Component Value Date   VD25OH 40.0 08/14/2017    Exercise  Getting back to exercise now after bout of depression   Running  5Ks occational  In past [facility]     Had dermatology visit in sept  Uses retin a for acne   Mood    03/29/2024   10:13 AM 03/20/2023    8:25 AM 09/03/2022    4:18 PM 10/15/2021    8:05 AM 11/30/2019    2:18 PM  Depression screen PHQ 2/9  Decreased Interest 1 1 0 0 0  Down, Depressed, Hopeless 1 1 0 0 0  PHQ - 2 Score 2 2 0 0 0  Altered sleeping 2 0 0    Tired, decreased energy 1 1 0    Change in appetite 0 1 0    Feeling bad or failure about yourself  2 1 0    Trouble concentrating 2 0 0    Moving slowly or fidgety/restless 0 0 0    Suicidal thoughts 1 0 0    PHQ-9 Score 10 5  0     Difficult doing work/chores Not difficult at  all Not difficult at all Not difficult at all       Data saved with a previous flowsheet row definition   History of depression with anxiety  Lexapro  20 mg daily  Weaned off then went back on 10 because depression relapsed (badly) - constant crying  Psychiatrist in past- dx with major depressive disorder   Prozac in past  Also adderall   Gene site testing-lexapro  works best   Also panic attacks  Will give it some more time   Cut out alcohol Exercised  Ate well  Good effort    Strong family history of depression  Feels like she is back to herself now   Is in counseling  Has visit this week  That is going well  CBT    Hyperlipidemia Lab Results  Component Value Date   CHOL 177 03/23/2024   CHOL 202 (H) 03/12/2023   CHOL 178 10/15/2021   Lab Results  Component Value Date  HDL 48.40 03/23/2024   HDL 50.20 03/12/2023   HDL 46.20 10/15/2021   Lab Results  Component Value Date   LDLCALC 114 (H) 03/23/2024   LDLCALC 138 (H) 03/12/2023   LDLCALC 108 (H) 10/15/2021   Lab Results  Component Value Date   TRIG 75.0 03/23/2024   TRIG 69.0 03/12/2023   TRIG 117.0 10/15/2021   Lab Results  Component Value Date   CHOLHDL 4 03/23/2024   CHOLHDL 4 03/12/2023   CHOLHDL 4 10/15/2021   No results found for: LDLDIRECT Improved LDL  Eating better  Cut pork / bacon and red meat     Lab Results  Component Value Date   ALT 16 03/23/2024   AST 18 03/23/2024   ALKPHOS 33 (L) 03/23/2024   BILITOT 0.5 03/23/2024   Lab Results  Component Value Date   NA 137 03/23/2024   K 4.0 03/23/2024   CO2 28 03/23/2024   GLUCOSE 86 03/23/2024   BUN 15 03/23/2024   CREATININE 0.72 03/23/2024   CALCIUM 9.1 03/23/2024   GFR 106.81 03/23/2024   GFRNONAA >60 01/31/2020   Lab Results  Component Value Date   WBC 6.1 03/23/2024   HGB 12.8 03/23/2024   HCT 36.2 03/23/2024   MCV 91.8 03/23/2024   PLT 287.0 03/23/2024   Anemic in past  Lab Results  Component Value Date    IRON 101 03/23/2024   FERRITIN 37.7 03/23/2024  Takes iron daily  Lab Results  Component Value Date   TSH 1.09 03/23/2024      Patient Active Problem List   Diagnosis Date Noted   Hyperlipidemia 03/20/2023   Mild anemia 03/20/2023   Depression with anxiety 09/11/2020   History of gestational diabetes 11/10/2019   Seasonal allergic rhinitis 11/14/2014   Routine general medical examination at a health care facility 05/03/2014   History of meningitis 05/12/2008   Past Medical History:  Diagnosis Date   Anxiety    GDM (gestational diabetes mellitus), class A1 11/10/2019   03/2020: negative postpartum 2h GTT   H/O viral meningitis    History of chicken pox    Past Surgical History:  Procedure Laterality Date   EYE MUSCLE SURGERY Bilateral 1996   EYE MUSCLE SURGERY Bilateral 2000   Odontoma Removal/implant  2003   WISDOM TOOTH EXTRACTION     Social History   Tobacco Use   Smoking status: Never   Smokeless tobacco: Never  Vaping Use   Vaping status: Never Used  Substance Use Topics   Alcohol use: Not Currently    Alcohol/week: 0.0 standard drinks of alcohol    Comment: Social   Drug use: No   Family History  Problem Relation Age of Onset   Arthritis Mother    High blood pressure Mother    Diabetes Mother        Type II   Lung cancer Father    Hypertrophic cardiomyopathy Sister    High blood pressure Sister    Mental illness Sister    Diabetes Brother        Type II-BKA   Arthritis Maternal Grandmother    Stroke Maternal Grandmother    High blood pressure Maternal Grandmother    Diabetes Maternal Grandmother        Type II   Alcoholism Maternal Aunt    Kidney disease Maternal Aunt    Alcoholism Maternal Uncle    Lung cancer Paternal Aunt    Alcoholism Cousin    Heart disease Neg Hx  Cancer Neg Hx    No Known Allergies Current Outpatient Medications on File Prior to Visit  Medication Sig Dispense Refill   escitalopram  (LEXAPRO ) 20 MG tablet TAKE  1 TABLET BY MOUTH EVERY DAY (Patient taking differently: Take 10 mg by mouth daily.) 90 tablet 3   tretinoin  (RETIN-A ) 0.05 % cream Apply topically at bedtime. For acne, apply a thin coat to the face QHS as tolerated. 45 g 11   No current facility-administered medications on file prior to visit.    Review of Systems  Constitutional:  Positive for fatigue. Negative for activity change, appetite change, fever and unexpected weight change.  HENT:  Negative for congestion, ear pain, rhinorrhea, sinus pressure and sore throat.   Eyes:  Negative for pain, redness and visual disturbance.  Respiratory:  Negative for cough, shortness of breath and wheezing.   Cardiovascular:  Negative for chest pain and palpitations.  Gastrointestinal:  Negative for abdominal pain, blood in stool, constipation and diarrhea.  Endocrine: Negative for polydipsia and polyuria.  Genitourinary:  Negative for dysuria, frequency and urgency.  Musculoskeletal:  Negative for arthralgias, back pain and myalgias.  Skin:  Negative for pallor and rash.  Allergic/Immunologic: Negative for environmental allergies.  Neurological:  Negative for dizziness, syncope and headaches.  Hematological:  Negative for adenopathy. Does not bruise/bleed easily.  Psychiatric/Behavioral:  Positive for dysphoric mood and sleep disturbance. Negative for decreased concentration. The patient is not nervous/anxious.        Objective:   Physical Exam Constitutional:      General: She is not in acute distress.    Appearance: Normal appearance. She is well-developed. She is not ill-appearing or diaphoretic.     Comments: Overwt   HENT:     Head: Normocephalic and atraumatic.     Right Ear: Tympanic membrane, ear canal and external ear normal.     Left Ear: Tympanic membrane, ear canal and external ear normal.     Nose: Nose normal. No congestion.     Mouth/Throat:     Mouth: Mucous membranes are moist.     Pharynx: Oropharynx is clear. No  posterior oropharyngeal erythema.  Eyes:     General: No scleral icterus.    Extraocular Movements: Extraocular movements intact.     Conjunctiva/sclera: Conjunctivae normal.     Pupils: Pupils are equal, round, and reactive to light.  Neck:     Thyroid : No thyromegaly.     Vascular: No carotid bruit or JVD.  Cardiovascular:     Rate and Rhythm: Normal rate and regular rhythm.     Pulses: Normal pulses.     Heart sounds: Normal heart sounds.     No gallop.  Pulmonary:     Effort: Pulmonary effort is normal. No respiratory distress.     Breath sounds: Normal breath sounds. No wheezing.     Comments: Good air exch Chest:     Chest wall: No tenderness.  Abdominal:     General: Bowel sounds are normal. There is no distension or abdominal bruit.     Palpations: Abdomen is soft. There is no mass.     Tenderness: There is no abdominal tenderness.     Hernia: No hernia is present.  Genitourinary:    Comments: Breast and pelvic exam are done by gyn provider   Musculoskeletal:        General: No tenderness. Normal range of motion.     Cervical back: Normal range of motion and neck supple. No rigidity. No muscular  tenderness.     Right lower leg: No edema.     Left lower leg: No edema.     Comments: No kyphosis   Lymphadenopathy:     Cervical: No cervical adenopathy.  Skin:    General: Skin is warm and dry.     Coloration: Skin is not pale.     Findings: No erythema or rash.     Comments: Solar lentigines diffusely   Neurological:     Mental Status: She is alert. Mental status is at baseline.     Cranial Nerves: No cranial nerve deficit.     Motor: No abnormal muscle tone.     Coordination: Coordination normal.     Gait: Gait normal.     Deep Tendon Reflexes: Reflexes are normal and symmetric. Reflexes normal.  Psychiatric:        Mood and Affect: Mood normal.        Cognition and Memory: Cognition and memory normal.     Comments: Candidly discusses symptoms and stressors              Assessment & Plan:   Problem List Items Addressed This Visit       Other   Routine general medical examination at a health care facility - Primary   Reviewed health habits including diet and exercise and skin cancer prevention Reviewed appropriate screening tests for age  Also reviewed health mt list, fam hx and immunization status , as well as social and family history   See HPI Labs reviewed and ordered Health Maintenance  Topic Date Due   Hepatitis B Vaccine (1 of 3 - 19+ 3-dose series) Never done   HPV Vaccine (1 - Risk 3-dose SCDM series) Never done   Flu Shot  08/09/2024*   COVID-19 Vaccine (1) 04/04/2025*   DTaP/Tdap/Td vaccine (2 - Td or Tdap) 01/09/2026   Pap with HPV screening  08/29/2026   Hepatitis C Screening  Completed   HIV Screening  Completed   Pneumococcal Vaccine  Aged Out   Meningitis B Vaccine  Aged Out  *Topic was postponed. The date shown is not the original due date.    Sees gyn provider  No fam history of colon cancer will start screening at 63 Discussed fall prevention, supplements and exercise for bone density  Getting back to exercise after depressive episode PHQ 10 but thinks she is improving with medication and counseling  Labs reviewed        Mild anemia   Takes iron daily  Normal cbc and iron labs       Hyperlipidemia   Disc goals for lipids and reasons to control them Rev last labs with pt Rev low sat fat diet in detail Eating better  LDL down to 114  Commended        Depression with anxiety   Has seen psychiatry in distant past and determined that lexapro  is best choice with gene site testing  Tried to come off of 20 mg lexapro  and became very depressed Back on it now at 10 mg and doing much better PHQ 10 Continues to improve Wants to continue this dose and call if she wants to increase it  Continues counseling Reviewed stressors/ coping techniques/symptoms/ support sources/ tx options and side effects in  detail today

## 2024-03-29 NOTE — Assessment & Plan Note (Signed)
 Has seen psychiatry in distant past and determined that lexapro  is best choice with gene site testing  Tried to come off of 20 mg lexapro  and became very depressed Back on it now at 10 mg and doing much better PHQ 10 Continues to improve Wants to continue this dose and call if she wants to increase it  Continues counseling Reviewed stressors/ coping techniques/symptoms/ support sources/ tx options and side effects in detail today

## 2024-05-12 ENCOUNTER — Other Ambulatory Visit: Payer: Self-pay | Admitting: Family Medicine

## 2025-01-23 ENCOUNTER — Ambulatory Visit: Admitting: Dermatology
